# Patient Record
Sex: Male | Born: 1940
Health system: Southern US, Community
[De-identification: ages and names within clinical notes are randomized; demographics above are authoritative.]

## PROBLEM LIST (undated history)

## (undated) DIAGNOSIS — I4891 Unspecified atrial fibrillation: Secondary | ICD-10-CM

## (undated) DIAGNOSIS — N200 Calculus of kidney: Secondary | ICD-10-CM

## (undated) DIAGNOSIS — Z9189 Other specified personal risk factors, not elsewhere classified: Secondary | ICD-10-CM

## (undated) DIAGNOSIS — E669 Obesity, unspecified: Secondary | ICD-10-CM

## (undated) DIAGNOSIS — E782 Mixed hyperlipidemia: Secondary | ICD-10-CM

## (undated) DIAGNOSIS — I10 Essential (primary) hypertension: Secondary | ICD-10-CM

## (undated) DIAGNOSIS — N63 Unspecified lump in unspecified breast: Secondary | ICD-10-CM

## (undated) DIAGNOSIS — N400 Enlarged prostate without lower urinary tract symptoms: Secondary | ICD-10-CM

## (undated) DIAGNOSIS — J309 Allergic rhinitis, unspecified: Secondary | ICD-10-CM

## (undated) DIAGNOSIS — G4730 Sleep apnea, unspecified: Secondary | ICD-10-CM

## (undated) DIAGNOSIS — Z950 Presence of cardiac pacemaker: Secondary | ICD-10-CM

## (undated) DIAGNOSIS — K5790 Diverticulosis of intestine, part unspecified, without perforation or abscess without bleeding: Secondary | ICD-10-CM

## (undated) DIAGNOSIS — N529 Male erectile dysfunction, unspecified: Secondary | ICD-10-CM

## (undated) DIAGNOSIS — Z8619 Personal history of other infectious and parasitic diseases: Secondary | ICD-10-CM

## (undated) DIAGNOSIS — N201 Calculus of ureter: Secondary | ICD-10-CM

## (undated) DIAGNOSIS — K429 Umbilical hernia without obstruction or gangrene: Secondary | ICD-10-CM

## (undated) DIAGNOSIS — R319 Hematuria, unspecified: Secondary | ICD-10-CM

## (undated) DIAGNOSIS — E890 Postprocedural hypothyroidism: Secondary | ICD-10-CM

## (undated) HISTORY — DX: Unspecified atrial fibrillation: I48.91

## (undated) HISTORY — DX: Hematuria, unspecified: R31.9

## (undated) HISTORY — DX: Essential (primary) hypertension: I10

## (undated) HISTORY — PX: TONSILLECTOMY AND ADENOIDECTOMY: SUR1326

## (undated) HISTORY — DX: Umbilical hernia without obstruction or gangrene: K42.9

## (undated) HISTORY — DX: Calculus of ureter: N20.1

## (undated) HISTORY — DX: Personal history of other infectious and parasitic diseases: Z86.19

## (undated) HISTORY — DX: Unspecified lump in unspecified breast: N63.0

## (undated) HISTORY — DX: Calculus of kidney: N20.0

## (undated) HISTORY — DX: Mixed hyperlipidemia: E78.2

## (undated) HISTORY — DX: Benign prostatic hyperplasia without lower urinary tract symptoms: N40.0

## (undated) HISTORY — DX: Postprocedural hypothyroidism: E89.0

## (undated) HISTORY — DX: Obesity, unspecified: E66.9

## (undated) HISTORY — DX: Male erectile dysfunction, unspecified: N52.9

## (undated) HISTORY — PX: THYROGLOSSAL DUCT CYST: SHX297

## (undated) HISTORY — DX: Other specified personal risk factors, not elsewhere classified: Z91.89

## (undated) HISTORY — DX: Diverticulosis of intestine, part unspecified, without perforation or abscess without bleeding: K57.90

## (undated) HISTORY — PX: COLONOSCOPY: SHX174

## (undated) HISTORY — PX: VASECTOMY: SHX75

## (undated) HISTORY — DX: Allergic rhinitis, unspecified: J30.9

---

## 1992-01-17 HISTORY — PX: ANTERIOR CRUCIATE LIGAMENT REPAIR: SHX115

## 1993-01-16 HISTORY — PX: OTHER SURGICAL HISTORY: SHX169

## 1997-07-30 ENCOUNTER — Emergency Department (HOSPITAL_COMMUNITY): Admission: EM | Admit: 1997-07-30 | Discharge: 1997-07-30 | Payer: Self-pay | Admitting: Emergency Medicine

## 1997-08-03 ENCOUNTER — Ambulatory Visit: Admission: RE | Admit: 1997-08-03 | Discharge: 1997-08-03 | Payer: Self-pay | Admitting: Family Medicine

## 1997-08-06 ENCOUNTER — Ambulatory Visit (HOSPITAL_COMMUNITY): Admission: RE | Admit: 1997-08-06 | Discharge: 1997-08-06 | Payer: Self-pay | Admitting: Family Medicine

## 1999-01-17 HISTORY — PX: FLEXIBLE SIGMOIDOSCOPY: SHX1649

## 1999-02-21 ENCOUNTER — Other Ambulatory Visit: Admission: RE | Admit: 1999-02-21 | Discharge: 1999-02-21 | Payer: Self-pay | Admitting: *Deleted

## 2002-01-16 HISTORY — PX: MENISCUS REPAIR: SHX5179

## 2002-07-07 ENCOUNTER — Encounter: Admission: RE | Admit: 2002-07-07 | Discharge: 2002-07-07 | Payer: Self-pay | Admitting: Family Medicine

## 2002-07-07 ENCOUNTER — Encounter: Payer: Self-pay | Admitting: Family Medicine

## 2002-07-15 ENCOUNTER — Encounter: Payer: Self-pay | Admitting: Family Medicine

## 2002-07-15 ENCOUNTER — Encounter: Admission: RE | Admit: 2002-07-15 | Discharge: 2002-07-15 | Payer: Self-pay | Admitting: Family Medicine

## 2002-07-23 ENCOUNTER — Ambulatory Visit (HOSPITAL_BASED_OUTPATIENT_CLINIC_OR_DEPARTMENT_OTHER): Admission: RE | Admit: 2002-07-23 | Discharge: 2002-07-23 | Payer: Self-pay | Admitting: Orthopedic Surgery

## 2002-11-17 HISTORY — PX: SHOULDER SURGERY: SHX246

## 2002-11-19 ENCOUNTER — Ambulatory Visit (HOSPITAL_BASED_OUTPATIENT_CLINIC_OR_DEPARTMENT_OTHER): Admission: RE | Admit: 2002-11-19 | Discharge: 2002-11-19 | Payer: Self-pay | Admitting: Orthopedic Surgery

## 2003-01-17 HISTORY — PX: FOOT SURGERY: SHX648

## 2003-11-14 ENCOUNTER — Emergency Department (HOSPITAL_COMMUNITY): Admission: EM | Admit: 2003-11-14 | Discharge: 2003-11-14 | Payer: Self-pay | Admitting: Emergency Medicine

## 2006-10-03 ENCOUNTER — Emergency Department (HOSPITAL_COMMUNITY): Admission: EM | Admit: 2006-10-03 | Discharge: 2006-10-03 | Payer: Self-pay | Admitting: Emergency Medicine

## 2008-10-22 ENCOUNTER — Encounter (HOSPITAL_COMMUNITY): Admission: RE | Admit: 2008-10-22 | Discharge: 2009-01-15 | Payer: Self-pay | Admitting: Internal Medicine

## 2008-11-24 ENCOUNTER — Ambulatory Visit: Payer: Self-pay | Admitting: Internal Medicine

## 2008-11-24 ENCOUNTER — Observation Stay (HOSPITAL_COMMUNITY): Admission: EM | Admit: 2008-11-24 | Discharge: 2008-11-27 | Payer: Self-pay | Admitting: Emergency Medicine

## 2008-11-26 ENCOUNTER — Encounter: Payer: Self-pay | Admitting: Internal Medicine

## 2008-11-26 HISTORY — PX: PACEMAKER INSERTION: SHX728

## 2008-11-30 ENCOUNTER — Ambulatory Visit: Payer: Self-pay

## 2008-11-30 ENCOUNTER — Ambulatory Visit: Payer: Self-pay | Admitting: Internal Medicine

## 2008-11-30 DIAGNOSIS — I4891 Unspecified atrial fibrillation: Secondary | ICD-10-CM | POA: Insufficient documentation

## 2008-11-30 DIAGNOSIS — Z950 Presence of cardiac pacemaker: Secondary | ICD-10-CM

## 2008-12-16 ENCOUNTER — Encounter: Payer: Self-pay | Admitting: Internal Medicine

## 2008-12-16 ENCOUNTER — Ambulatory Visit: Payer: Self-pay

## 2009-01-16 HISTORY — PX: SHOULDER SURGERY: SHX246

## 2009-02-19 DIAGNOSIS — I10 Essential (primary) hypertension: Secondary | ICD-10-CM | POA: Insufficient documentation

## 2009-02-19 DIAGNOSIS — E039 Hypothyroidism, unspecified: Secondary | ICD-10-CM

## 2009-02-23 ENCOUNTER — Ambulatory Visit: Payer: Self-pay | Admitting: Internal Medicine

## 2009-06-22 ENCOUNTER — Encounter: Admission: RE | Admit: 2009-06-22 | Discharge: 2009-06-22 | Payer: Self-pay | Admitting: Orthopedic Surgery

## 2009-07-09 ENCOUNTER — Ambulatory Visit (HOSPITAL_BASED_OUTPATIENT_CLINIC_OR_DEPARTMENT_OTHER): Admission: RE | Admit: 2009-07-09 | Discharge: 2009-07-09 | Payer: Self-pay | Admitting: Orthopedic Surgery

## 2009-11-16 ENCOUNTER — Ambulatory Visit: Payer: Self-pay | Admitting: Internal Medicine

## 2010-02-15 NOTE — Cardiovascular Report (Signed)
Summary: Office Visit   Office Visit   Imported By: Roderic Ovens 01/28/2009 16:14:17  _____________________________________________________________________  External Attachment:    Type:   Image     Comment:   External Document

## 2010-02-15 NOTE — Cardiovascular Report (Signed)
Summary: Office Visit   Office Visit   Imported By: Roderic Ovens 11/18/2009 16:09:41  _____________________________________________________________________  External Attachment:    Type:   Image     Comment:   External Document

## 2010-02-15 NOTE — Assessment & Plan Note (Signed)
Summary: device/saf   Visit Type:  PPM St Jude  CC:  no complaints.  History of Present Illness: Arthur Daniel is seen in followup for pacemaker implanted for tachybradycardia syndrome with paroxysmal atrial fibrillation.  thromboembolic risk factors are notable for hypertension and age for CHADS VASC score of 2 and a CAD score of one  He is followed by Dr. Raquel James  Problems Prior to Update: 1)  Sinus Bradycardia  (ICD-427.81) 2)  Hypothyroidism  (ICD-244.9) 3)  Hypertension  (ICD-401.9) 4)  Cardiac Pacemaker in Situ  (ICD-V45.01) 5)  Atrial Fibrillation  (ICD-427.31)  Current Medications (verified): 1)  Lisinopril 5 Mg Tabs (Lisinopril) .... Take One Tablet By Mouth Daily 2)  Levothroid 150 Mcg Tabs (Levothyroxine Sodium) .... Once Daily 3)  Alprazolam 0.5 Mg Tabs (Alprazolam) .... As Needed  Allergies (verified): No Known Drug Allergies  Past History:  Past Medical History: Last updated: 02/19/2009 1. Hyperlipidemia.   2. Atrial fibrillation.   3. Hypertension.   4. Anxiety.   5. Hyperthyroidism.      Past Surgical History: Last updated: 02/19/2009  Tonsillectomy, ACL repair, ear surgery, left   knee surgery, right shoulder surgery, left foot surgery.      Family History: Last updated: 02/19/2009  Significant for his father dying from alcoholic cirrhosis.   Brother died with coronary artery disease.   Mother also had coronary artery disease.   Social History: Last updated: 02/19/2009  He is a previous smoker, he quit.   He has given up drinking.   He does use chewing tobacco from time to time.   He has cut back on his caffeine also.      Vital Signs:  Patient profile:   70 year old male Height:      72 inches Weight:      261.75 pounds BMI:     35.63 Pulse rate:   65 / minute BP sitting:   122 / 78  (left arm) Cuff size:   regular  Vitals Entered By: Arthur Daniel CMA (November 16, 2009 11:53 AM)   PPM Specifications Following MD:  Sherryl Manges, MD      Referring MD:  [facility] PPM Vendor:  St Jude     PPM Model Number:  [id number]     PPM Serial Number:  [id number] PPM DOI:  11/26/2008     PPM Implanting MD:  Sherryl Manges, MD  Lead 1    Location: RA     DOI: 11/26/2008     Model #: [id number]     Serial #: [id number]     Status: active Lead 2    Location: RV     DOI: 11/26/2008     Model #: [id number]     Serial #: [id number]     Status: active  Magnet Response Rate:  BOL 100 ERI  85  Indications:  AFIB/BRADYCARDIA   PPM Follow Up Battery Voltage:  2.99 V     Battery Est. Longevity:  9.1-10.6 yrs     Pacer Dependent:  No       PPM Device Measurements Atrium  Amplitude: 5.0 mV, Impedance: 380 ohms, Threshold: 0.75 V at 0.4 msec Right Ventricle  Amplitude: 12.0 mV, Impedance: 460 ohms, Threshold: 0.75 V at 0.4 msec  Episodes MS Episodes:  0     Coumadin:  No Ventricular High Rate:  0     Atrial Pacing:  47%     Ventricular Pacing:  <1%  Parameters Mode:  DDDR  Lower Rate Limit:  60     Upper Rate Limit:  125 Paced AV Delay:  200     Sensed AV Delay:  200 Tech Comments:  NORMAL DEVICE FUNCTION.  NO EPISODES SINCE LAST CHECK.  CHANGED RV OUTPUT FROM 2.0 TO 2.5 V. FOLLOWED BY [facility]. Vella Kohler  November 16, 2009 11:52 AM  Impression & Recommendations:  Problem # 1:  CARDIAC PACEMAKER IN SITU (ICD-V45.01) devices be followed by Dr. Hedy Jacob we will see as needed

## 2010-02-15 NOTE — Cardiovascular Report (Signed)
Summary: Office Visit   Office Visit   Imported By: Roderic Ovens 03/05/2009 13:03:04  _____________________________________________________________________  External Attachment:    Type:   Image     Comment:   External Document

## 2010-02-15 NOTE — Cardiovascular Report (Signed)
Summary: Office Visit   Office Visit   Imported By: Roderic Ovens 01/28/2009 16:13:52  _____________________________________________________________________  External Attachment:    Type:   Image     Comment:   External Document

## 2010-02-15 NOTE — Assessment & Plan Note (Signed)
Summary: pc2/sl   History of Present Illness: Mr. Cacciola is seen in followup for pacemaker implanted for tachybradycardia syndrome with paroxysmal atrial fibrillation.  thromboembolic risk factors are notable for hypertension and age for CHADS VASC score of 2 and a CAD score of one  Current Medications (verified): 1)  Aspirin 81 Mg Tbec (Aspirin) .... Take One Tablet By Mouth Daily 2)  Lisinopril 5 Mg Tabs (Lisinopril) .... Take One Tablet By Mouth Daily 3)  Levothroid 112 Mcg Tabs (Levothyroxine Sodium) .... 1/2 Tablet Daily 4)  Alprazolam 0.5 Mg Tabs (Alprazolam) .... As Needed 5)  Fish Oil 1000 Mg Caps (Omega-3 Fatty Acids) .... Take One Capsule Daily  Allergies (verified): No Known Drug Allergies  Past History:  Past Medical History: Last updated: 02/19/2009 1. Hyperlipidemia.   2. Atrial fibrillation.   3. Hypertension.   4. Anxiety.   5. Hyperthyroidism.      Past Surgical History: Last updated: 02/19/2009  Tonsillectomy, ACL repair, ear surgery, left   knee surgery, right shoulder surgery, left foot surgery.      Family History: Last updated: 02/19/2009  Significant for his father dying from alcoholic cirrhosis.   Brother died with coronary artery disease.   Mother also had coronary artery disease.   Social History: Last updated: 02/19/2009  He is a previous smoker, he quit.   He has given up drinking.   He does use chewing tobacco from time to time.   He has cut back on his caffeine also.      Vital Signs:  Patient profile:   70 year old male Height:      72 inches Weight:      249 pounds BMI:     33.89 Pulse rate:   93 / minute Pulse rhythm:   regular BP sitting:   146 / 97  (left arm) Cuff size:   large  Vitals Entered By: Judithe Modest CMA (February 23, 2009 9:44 AM)   PPM Specifications Following MD:  Sherryl Manges, MD     Referring MD:  [facility] Vendor:  St Jude     PPM Model Number:  [id number]     PPM Serial Number:  [id number] PPM DOI:   11/26/2008     PPM Implanting MD:  Sherryl Manges, MD  Lead 1    Location: RA     DOI: 11/26/2008     Model #: [id number]     Serial #: [id number]     Status: active Lead 2    Location: RV     DOI: 11/26/2008     Model #: [id number]     Serial #: [id number]     Status: active  Magnet Response Rate:  BOL 100 ERI  85  Indications:  AFIB/BRADYCARDIA   PPM Follow Up Remote Check?  No Battery Voltage:  3.02 V     Battery Est. Longevity:  9.8 years     Pacer Dependent:  No       PPM Device Measurements Atrium  Amplitude: 5.0 mV, Impedance: 380 ohms, Threshold: 0.75 V at 0.4 msec Right Ventricle  Amplitude: 12 mV, Impedance: 460 ohms, Threshold: 0.75 V at 0.4 msec  Episodes MS Episodes:  3     Percent Mode Switch:  <1%     Coumadin:  No Atrial Pacing:  49%     Ventricular Pacing:  <1%  Parameters Mode:  DDDR     Lower Rate Limit:  60     Upper Rate Limit:  120  Paced AV Delay:  200     Sensed AV Delay:  200 Tech Comments:  Outputs lowered to 2.0 in RA and RV.  PVC electrogram trigger off.  Longest mode switch 5 minute duration.  ROV 11/11 Dr. Graciela Husbands.  Checked by [employer]. Altha Harm, LPN  February 23, 2009 10:04 AM   Appended Document: [facility]     History of Present Illness:  hyperthyroidism and is recently he was started on thyroid replacement.  He has had problems with relatively low blood pressure and as such he discontinued his atenolol on his own yesterday. It also made him feel quite lethargic.  Allergies: No Known Drug Allergies  Physical Exam  General:  The patient was alert and oriented in no acute distress. HEENT Normal.  Neck veins were flat, carotids were brisk.  Lungs were clear.  Heart sounds were regular without murmurs or gallops.  Abdomen was soft with active bowel sounds. There is no clubbing cyanosis or edema. Skin Warm and dry pacemaker pocket was well-healed   PPM Specifications Following MD:  Sherryl Manges, MD     Referring MD:  [facility]  Vendor:  St Jude     PPM Model Number:  [id number]     PPM Serial Number:  [id number] PPM DOI:  11/26/2008     PPM Implanting MD:  Sherryl Manges, MD  Lead 1    Location: RA     DOI: 11/26/2008     Model #: [id number]     Serial #: [id number]     Status: active Lead 2    Location: RV     DOI: 11/26/2008     Model #: [id number]     Serial #: [id number]     Status: active  Magnet Response Rate:  BOL 100 ERI  85  Indications:  AFIB/BRADYCARDIA   PPM Follow Up Pacer Dependent:  No      Episodes Coumadin:  No  Parameters Mode:  DDDR     Lower Rate Limit:  60     Upper Rate Limit:  120 Paced AV Delay:  200     Sensed AV Delay:  200  Impression & Recommendations:  Problem # 1:  CARDIAC PACEMAKER IN SITU (ICD-V45.01) Device parameters and data were reviewed and no changes were made apart from reprogramming the device to maximize longevity    Problem # 2:  ATRIAL FIBRILLATION (ICD-427.31) There have been no intercurrent episodes of atrial fibrillation. It may well be that his atrial fibrillation was related to his hyperthyroid condition and with its normalization it is not an ongoing problem. I would withhold Coumadin at the present time until he declares with more atrial fibrillation. His updated medication list for this problem includes:    Aspirin 81 Mg Tbec (Aspirin) .Marland Kitchen... Take one tablet by mouth daily  Problem # 3:  HYPERTENSION (ICD-401.9) His blood pressure is elevated today. He stopped his atenolol yesterday. I recommended that he follow up with his primary care physician to make a decision as to how best to go forward. I would also suggest that we wait until his beta blocker withdrawal and his thyroid condition has stabilized prior to up titration of his antihypertensives His updated medication list for this problem includes:    Aspirin 81 Mg Tbec (Aspirin) .Marland Kitchen... Take one tablet by mouth daily    Lisinopril 5 Mg Tabs (Lisinopril) .Marland Kitchen... Take one tablet by mouth daily

## 2010-04-03 LAB — BASIC METABOLIC PANEL
BUN: 8 mg/dL (ref 6–23)
CO2: 28 mEq/L (ref 19–32)
Calcium: 8.9 mg/dL (ref 8.4–10.5)
Chloride: 105 mEq/L (ref 96–112)
Creatinine, Ser: 0.96 mg/dL (ref 0.4–1.5)
GFR calc Af Amer: >60 mL/min (ref >60)
GFR calc non Af Amer: >60 mL/min (ref >60)
Glucose, Bld: 88 mg/dL (ref 70–99)
Potassium: 4.8 mEq/L (ref 3.5–5.1)
Sodium: 139 mEq/L (ref 135–145)

## 2010-04-03 LAB — POCT HEMOGLOBIN-HEMACUE: Hemoglobin: 16.6 g/dL (ref 13.0–17.0)

## 2010-04-20 LAB — COMPREHENSIVE METABOLIC PANEL
ALT: 31 U/L (ref 0–53)
AST: 25 U/L (ref 0–37)
Albumin: 3.9 g/dL (ref 3.5–5.2)
Alkaline Phosphatase: 51 U/L (ref 39–117)
BUN: 13 mg/dL (ref 6–23)
CO2: 27 mEq/L (ref 19–32)
Calcium: 9 mg/dL (ref 8.4–10.5)
Chloride: 103 mEq/L (ref 96–112)
Creatinine, Ser: 0.87 mg/dL (ref 0.4–1.5)
GFR calc Af Amer: >60 mL/min (ref >60)
GFR calc non Af Amer: >60 mL/min (ref >60)
Glucose, Bld: 107 mg/dL — ABNORMAL HIGH (ref 70–99)
Potassium: 3.6 mEq/L (ref 3.5–5.1)
Sodium: 136 mEq/L (ref 135–145)
Total Bilirubin: 1.1 mg/dL (ref 0.3–1.2)
Total Protein: 6.6 g/dL (ref 6.0–8.3)

## 2010-04-20 LAB — BASIC METABOLIC PANEL
BUN: 12 mg/dL (ref 6–23)
Calcium: 8.5 mg/dL (ref 8.4–10.5)
GFR calc non Af Amer: >60 mL/min (ref >60)
Glucose, Bld: 106 mg/dL — ABNORMAL HIGH (ref 70–99)
Potassium: 3.1 mEq/L — ABNORMAL LOW (ref 3.5–5.1)

## 2010-04-20 LAB — CBC
HCT: 44.3 % (ref 39.0–52.0)
HCT: 47.4 % (ref 39.0–52.0)
Hemoglobin: 16.6 g/dL (ref 13.0–17.0)
MCHC: 35.1 g/dL (ref 30.0–36.0)
MCV: 90.1 fL (ref 78.0–100.0)
Platelets: 104 10*3/uL — ABNORMAL LOW (ref 150–400)
Platelets: 127 10*3/uL — ABNORMAL LOW (ref 150–400)
RBC: 5.26 MIL/uL (ref 4.22–5.81)
RDW: 12.3 % (ref 11.5–15.5)
RDW: 12.7 % (ref 11.5–15.5)
WBC: 7.1 10*3/uL (ref 4.0–10.5)

## 2010-04-20 LAB — CK TOTAL AND CKMB (NOT AT ARMC)
CK, MB: 1.5 ng/mL (ref 0.3–4.0)
Relative Index: INVALID (ref 0.0–2.5)
Total CK: 77 U/L (ref 7–232)

## 2010-04-20 LAB — CARDIAC PANEL(CRET KIN+CKTOT+MB+TROPI)
CK, MB: 0.8 ng/mL (ref 0.3–4.0)
Relative Index: INVALID (ref 0.0–2.5)
Total CK: 46 U/L (ref 7–232)
Troponin I: 0.01 ng/mL (ref 0.00–0.06)

## 2010-04-20 LAB — T4: T4, Total: 15.6 ug/dL — ABNORMAL HIGH (ref 5.0–12.5)

## 2010-04-20 LAB — POCT I-STAT, CHEM 8
BUN: 18 mg/dL (ref 6–23)
Calcium, Ion: 1.12 mmol/L (ref 1.12–1.32)
Chloride: 103 mEq/L (ref 96–112)
Creatinine, Ser: 1 mg/dL (ref 0.4–1.5)
Glucose, Bld: 103 mg/dL — ABNORMAL HIGH (ref 70–99)
HCT: 49 % (ref 39.0–52.0)
Hemoglobin: 16.7 g/dL (ref 13.0–17.0)
Potassium: 4.7 mEq/L (ref 3.5–5.1)
Sodium: 138 mEq/L (ref 135–145)
TCO2: 29 mmol/L (ref 0–100)

## 2010-04-20 LAB — PROTIME-INR: Prothrombin Time: 13.3 seconds (ref 11.6–15.2)

## 2010-04-20 LAB — TSH: TSH: 0.008 u[IU]/mL — ABNORMAL LOW (ref 0.350–4.500)

## 2010-04-20 LAB — T4, FREE: Free T4: 2.91 ng/dL — ABNORMAL HIGH (ref 0.80–1.80)

## 2010-04-20 LAB — TROPONIN I: Troponin I: 0.02 ng/mL (ref 0.00–0.06)

## 2010-04-20 LAB — T3: T3, Total: 210.9 ng/dl — ABNORMAL HIGH (ref 80.0–204.0)

## 2010-06-03 NOTE — Op Note (Signed)
NAME:  Arthur Daniel, Arthur Daniel                         ACCOUNT NO.:  [id number]   MEDICAL RECORD NO.:  [id number]                   PATIENT TYPE:  AMB   LOCATION:  DSC                                  FACILITY:  [facility]   PHYSICIAN:  Harvie Junior, M.D.                DATE OF BIRTH:  1940-10-14   DATE OF PROCEDURE:  11/19/2002  DATE OF DISCHARGE:  11/19/2002                                 OPERATIVE REPORT   DATE OF REPEAT LOST DICTATION:  January 07, 2003   PREOPERATIVE DIAGNOSES:  1. Impingement of right shoulder with acromioclavicular joint arthritis.  2. Partial thickness rotator cuff tear on the undersurface.   POSTOPERATIVE DIAGNOSES:  1. Impingement of right shoulder with acromioclavicular joint arthritis.  2. Partial thickness rotator cuff tear on the undersurface.   PROCEDURE:  1. Right shoulder arthroscopy with subacromial decompression.  2. Partial distal clavicle resection.  3. Debridement of undersurface partial thickness, rotator cuff tear.   SURGEON:  Harvie Junior, M.D.   ASSISTANT:  Marshia Ly, P.A.   ANESTHESIA:  General.   BRIEF HISTORY:  He is a 70 year old male with a long history of having  significant right shoulder pain.  He was treated conservative with anti-  inflammatory medication, activity modification, physical therapy,  stretching, strengthening program; all of this having failed.  The patient  was ultimately taken to the operating room for examination under anesthesia  and arthroscopy.   DESCRIPTION OF PROCEDURE:  The patient was taken to the operating room and  when after adequate anesthesia was obtained with a general anesthetic the  patient was placed supine on the operating table.  The right shoulder was  prepped and draped in the usual sterile fashion.  Following this routine  arthroscopic examination of the shoulder revealed there was an obvious  undersurface tear of the rotator cuff. This was debrided with a suction  shaver back to a  smooth and stable rim. A probe was used to make sure that  this was stable.   Attention was then turned to the biceps tendon which was within normal  limits with no significant or degenerative changes of the glenohumeral  joint.  Attention was turned out of the glenohumeral joint into the  subacromial space where an anterolateral acromioplasty was perform through  the posterior and lateral portal. Finishing the acromioplasty attention was  turned to the distal clavicle where the clavicle was obviously impinging the  superior surface of the rotator cuff.  A 2 cm distal clavicle was excised  through the anterior portal.  At this point a probe was used to make sure  that the 15 mm of the distal clavicle was resected. At this point, the  shoulder was copiously  irrigated and suctioned dry.  The outside portals were closed with a  bandage.  A sterile compression dressing was applied.  The patient was taken  to the recovery room  where he was noted to be in satisfactory condition.   ESTIMATED BLOOD LOSS:  None.                                               Harvie Junior, M.D.    Ranae Plumber  D:  01/09/2003  T:  01/11/2003  Job:  [id number]

## 2010-06-03 NOTE — Op Note (Signed)
Arthur Daniel, Arthur Daniel               ACCOUNT NO.:  [id number]   MEDICAL RECORD NO.:  [id number]          PATIENT TYPE:  EMS   LOCATION:  MINO                         FACILITY:  [facility]   PHYSICIAN:  Feliberto Gottron. Turner Daniels, M.D.   DATE OF BIRTH:  02/23/1940   DATE OF PROCEDURE:  11/14/2003  DATE OF DISCHARGE:  11/14/2003                                 OPERATIVE REPORT   PREOPERATIVE DIAGNOSIS:  Left foot chain saw laceration with cuneiform  fracture.   POSTOPERATIVE DIAGNOSIS:  Left foot chain saw laceration with cuneiform  fracture.   OPERATION PERFORMED:  I & D of left foot.   SURGEON:  Feliberto Gottron. Turner Daniels, M.D.   ASSISTANTLaural Benes. Jannet Mantis.   ANESTHESIA:  General endotracheal.   ESTIMATED BLOOD LOSS:  50 mL.   FLUIDS REPLACED:  700 mL.   TOURNIQUET TIME:  Seven minutes.   INDICATIONS FOR PROCEDURE:  The patient is a 70 year old man who was  operating a chain saw when it kicked back, cut through his leather boot and  into the medial  aspect of his left foot.  Fairly impressive laceration and  he cut a trough in his cuneiform based on x-ray.  It does not look it is a  through-and-through fracture but he cut a trough that was about a half inch  deep over the width of the cuneiform medially in his food and he is taken  for irrigation and debridement a few hours after the injury after going  through the emergency room.   DESCRIPTION OF PROCEDURE:  The patient was identified by arm band, taken to  the operating room at [facility] main hospital where appropriate  anesthetic monitors were attached.  General endotracheal anesthesia was  induced with the patient in supine position tourniquet applied to the left  ankle and the foot was elevated.  He was then prepped and draped in the  usual sterile fashion.  Tourniquet inflated to 300 mmHg and we began the  procedure by removing bits of necrotic skin from the edge of the wound which  actually came together quite nicely and  remarkably there was little if any  dirt noted in the skin, the subcutaneous tissue or down in the cuneiform  which was very easy to visualize through the wound and in fact it  had not  fractured through-and-through.  This was simply a trough cut into the  cuneiform that did not go through to the lateral side.  In any event, using  sharp dissection, any bits of necrotic tissue were removed.  Again, there  was very little if any dirt.  He was then thoroughly pulse lavaged out with  3 liters of normal saline solution and the wound was very loosely closed  with the skin only using 2-0 nylon suture.  The tourniquet was let down  prior to the closure.  There was little if any bleeding noted and  miraculously he had not cut any significant tendons, nerves or vessels when  he made his medial approach to the cuneiform.  Obviously the short flexor  muscle bellies  were at least partially torn but remarkably he had not done  too much structural damage to the foot except for the cutting through about  50% of the cuneiform bone.  He was given  perioperative antibiotics.  He got some antibiotics in the emergency room  prior to coming up as well.  In any event, a dressing of  4 x 4s, Webril and  an Ace wrap was applied.  The patient was awakened and taken to the recovery  room without difficulty.      Emilio Aspen  D:  11/30/2003  T:  11/30/2003  Job:  [id number]

## 2010-06-03 NOTE — Op Note (Signed)
NAME:  Arthur Daniel, Arthur Daniel                         ACCOUNT NO.:  [id number]   MEDICAL RECORD NO.:  [id number]                   PATIENT TYPE:  AMB   LOCATION:  DSC                                  FACILITY:  [facility]   PHYSICIAN:  Harvie Junior, M.D.                DATE OF BIRTH:  [birth year]   DATE OF PROCEDURE:  07/23/2002  DATE OF DISCHARGE:                                 OPERATIVE REPORT   PREOPERATIVE DIAGNOSIS:  Medial meniscal tear.   POSTOPERATIVE DIAGNOSES:  1. Medial meniscal tear.  2. Medial shelf plica with chondral injury medial femoral condyle.   OPERATION:  1. Partial posterior medial meniscectomy.  2. medial plica resection with debridement of femoral condylar defect.   SURGEON:  Harvie Junior, M.D.   ASSISTANT:  Marshia Ly, P.A.   ANESTHESIA:  General.   BRIEF HISTORY:  He is a 70 year old male with a long history of having right  knee pain after a twisting type injury, stepping down off of a trailer.  He  ultimately was seen by Dr. Foy Guadalajara.  An MRI was obtained which showed that he  had a posterior horn medial meniscal tear.  The patient presented to our  office for evaluation.  At that time, we talked about treatment options.  We  ultimately felt that operative intervention would be the most appropriate  course of action.  He was brought to the operating room for this procedure.   DESCRIPTION OF PROCEDURE:  The patient was brought to the operating room and  after adequate anesthesia was obtained with general anesthetic, the patient  was placed supine on the operating table.  The left leg was prepped and  draped in the usual sterile fashion.  Following this, routine arthroscopic  examination of the knee revealed there was an obvious medial shelf plica.  It was large and abrading the medial femoral condyle.  The plica was  resected back to the rim and the area of abrasion of the condyle was  smoothed down.  Attention was then turned to the medial compartment,  where  there was noted to be a large posterior horn medial meniscal tear.  This was  debrided with a combination of straight biting forceps, upbiting forceps and  right hand side biting forceps back to a smooth and stable rim.  The cannula  was removed from the medial to lateral compartment and the leading edge was  taken down to avoid having any tendency towards catching.  At this point,  the knee was copiously irrigated and suctioned dry after a check was made of  the anterior cruciate ligament which was normal, lateral side normal,  patella femoral  midline tracking and no abnormality.  At this point, a sterile compressive  dressing was applied.  The patient was taken to the recovery room and was  noted to be in satisfactory condition.  The estimated blood loss for  the  procedure was none.                                               Harvie Junior, M.D.    Ranae Plumber  D:  07/23/2002  T:  07/24/2002  Job:  [id number]   cc:   Molly Maduro L. Foy Guadalajara, M.D.  [address] [place]  [place] [address]  Fax: [phone]

## 2010-08-17 HISTORY — PX: CATARACT EXTRACTION, BILATERAL: SHX1313

## 2010-10-11 ENCOUNTER — Other Ambulatory Visit: Payer: Self-pay | Admitting: Family Medicine

## 2010-10-27 LAB — URINE MICROSCOPIC-ADD ON

## 2010-10-27 LAB — URINALYSIS, ROUTINE W REFLEX MICROSCOPIC
Glucose, UA: NEGATIVE
Ketones, ur: 15 — AB
Leukocytes, UA: NEGATIVE
Nitrite: NEGATIVE
Protein, ur: NEGATIVE
Specific Gravity, Urine: 1.026
Urobilinogen, UA: 1
pH: 6

## 2011-01-20 DIAGNOSIS — N281 Cyst of kidney, acquired: Secondary | ICD-10-CM | POA: Diagnosis not present

## 2011-01-20 DIAGNOSIS — N529 Male erectile dysfunction, unspecified: Secondary | ICD-10-CM | POA: Diagnosis not present

## 2011-01-20 DIAGNOSIS — N200 Calculus of kidney: Secondary | ICD-10-CM | POA: Diagnosis not present

## 2011-02-01 DIAGNOSIS — D126 Benign neoplasm of colon, unspecified: Secondary | ICD-10-CM | POA: Diagnosis not present

## 2011-03-20 DIAGNOSIS — J069 Acute upper respiratory infection, unspecified: Secondary | ICD-10-CM | POA: Diagnosis not present

## 2011-03-20 DIAGNOSIS — Z1331 Encounter for screening for depression: Secondary | ICD-10-CM | POA: Diagnosis not present

## 2011-05-01 DIAGNOSIS — H7010 Chronic mastoiditis, unspecified ear: Secondary | ICD-10-CM | POA: Diagnosis not present

## 2011-05-01 DIAGNOSIS — H6120 Impacted cerumen, unspecified ear: Secondary | ICD-10-CM | POA: Diagnosis not present

## 2011-05-03 DIAGNOSIS — D485 Neoplasm of uncertain behavior of skin: Secondary | ICD-10-CM | POA: Diagnosis not present

## 2011-05-03 DIAGNOSIS — L821 Other seborrheic keratosis: Secondary | ICD-10-CM | POA: Diagnosis not present

## 2011-05-03 DIAGNOSIS — L820 Inflamed seborrheic keratosis: Secondary | ICD-10-CM | POA: Diagnosis not present

## 2011-05-23 DIAGNOSIS — E669 Obesity, unspecified: Secondary | ICD-10-CM | POA: Diagnosis not present

## 2011-05-23 DIAGNOSIS — E782 Mixed hyperlipidemia: Secondary | ICD-10-CM | POA: Diagnosis not present

## 2011-05-23 DIAGNOSIS — I10 Essential (primary) hypertension: Secondary | ICD-10-CM | POA: Diagnosis not present

## 2011-05-23 DIAGNOSIS — I4891 Unspecified atrial fibrillation: Secondary | ICD-10-CM | POA: Diagnosis not present

## 2011-08-16 DIAGNOSIS — K648 Other hemorrhoids: Secondary | ICD-10-CM | POA: Diagnosis not present

## 2011-08-30 DIAGNOSIS — K648 Other hemorrhoids: Secondary | ICD-10-CM | POA: Diagnosis not present

## 2011-09-13 DIAGNOSIS — K648 Other hemorrhoids: Secondary | ICD-10-CM | POA: Diagnosis not present

## 2011-10-30 DIAGNOSIS — H7010 Chronic mastoiditis, unspecified ear: Secondary | ICD-10-CM | POA: Diagnosis not present

## 2011-10-30 DIAGNOSIS — H902 Conductive hearing loss, unspecified: Secondary | ICD-10-CM | POA: Diagnosis not present

## 2011-11-16 DIAGNOSIS — M25569 Pain in unspecified knee: Secondary | ICD-10-CM | POA: Diagnosis not present

## 2011-11-28 DIAGNOSIS — L820 Inflamed seborrheic keratosis: Secondary | ICD-10-CM | POA: Diagnosis not present

## 2012-01-06 DIAGNOSIS — M546 Pain in thoracic spine: Secondary | ICD-10-CM | POA: Diagnosis not present

## 2012-01-17 LAB — HM COLONOSCOPY

## 2012-04-29 DIAGNOSIS — H7010 Chronic mastoiditis, unspecified ear: Secondary | ICD-10-CM | POA: Diagnosis not present

## 2012-04-29 DIAGNOSIS — H903 Sensorineural hearing loss, bilateral: Secondary | ICD-10-CM | POA: Diagnosis not present

## 2012-05-21 DIAGNOSIS — I10 Essential (primary) hypertension: Secondary | ICD-10-CM | POA: Diagnosis not present

## 2012-05-21 DIAGNOSIS — E782 Mixed hyperlipidemia: Secondary | ICD-10-CM | POA: Diagnosis not present

## 2012-05-21 DIAGNOSIS — I4891 Unspecified atrial fibrillation: Secondary | ICD-10-CM | POA: Diagnosis not present

## 2012-05-21 DIAGNOSIS — E669 Obesity, unspecified: Secondary | ICD-10-CM | POA: Diagnosis not present

## 2012-06-06 DIAGNOSIS — E890 Postprocedural hypothyroidism: Secondary | ICD-10-CM | POA: Diagnosis not present

## 2012-06-06 DIAGNOSIS — E0500 Thyrotoxicosis with diffuse goiter without thyrotoxic crisis or storm: Secondary | ICD-10-CM | POA: Diagnosis not present

## 2012-09-19 DIAGNOSIS — S4350XA Sprain of unspecified acromioclavicular joint, initial encounter: Secondary | ICD-10-CM | POA: Diagnosis not present

## 2012-10-01 DIAGNOSIS — L820 Inflamed seborrheic keratosis: Secondary | ICD-10-CM | POA: Diagnosis not present

## 2012-10-01 DIAGNOSIS — L821 Other seborrheic keratosis: Secondary | ICD-10-CM | POA: Diagnosis not present

## 2012-10-16 DIAGNOSIS — H6120 Impacted cerumen, unspecified ear: Secondary | ICD-10-CM | POA: Diagnosis not present

## 2012-10-16 DIAGNOSIS — H7010 Chronic mastoiditis, unspecified ear: Secondary | ICD-10-CM | POA: Diagnosis not present

## 2012-10-16 DIAGNOSIS — H903 Sensorineural hearing loss, bilateral: Secondary | ICD-10-CM | POA: Diagnosis not present

## 2012-11-05 DIAGNOSIS — R071 Chest pain on breathing: Secondary | ICD-10-CM | POA: Diagnosis not present

## 2012-12-10 ENCOUNTER — Encounter: Payer: Self-pay | Admitting: Internal Medicine

## 2012-12-17 DIAGNOSIS — R05 Cough: Secondary | ICD-10-CM | POA: Diagnosis not present

## 2012-12-17 DIAGNOSIS — H6690 Otitis media, unspecified, unspecified ear: Secondary | ICD-10-CM | POA: Diagnosis not present

## 2012-12-17 DIAGNOSIS — R059 Cough, unspecified: Secondary | ICD-10-CM | POA: Diagnosis not present

## 2012-12-23 DIAGNOSIS — H6120 Impacted cerumen, unspecified ear: Secondary | ICD-10-CM | POA: Diagnosis not present

## 2012-12-23 DIAGNOSIS — H7010 Chronic mastoiditis, unspecified ear: Secondary | ICD-10-CM | POA: Diagnosis not present

## 2012-12-25 ENCOUNTER — Ambulatory Visit (INDEPENDENT_AMBULATORY_CARE_PROVIDER_SITE_OTHER): Payer: Medicare Other | Admitting: *Deleted

## 2012-12-25 DIAGNOSIS — I4891 Unspecified atrial fibrillation: Secondary | ICD-10-CM

## 2012-12-27 NOTE — Progress Notes (Signed)
Pacemaker check in clinic. Normal device function. Thresholds, sensing, impedances consistent with previous measurements. Device programmed to maximize longevity. No mode switch or high ventricular rates noted. Device programmed at appropriate safety margins. Histogram distribution appropriate for patient activity level. Device programmed to optimize intrinsic conduction. Estimated longevity 7-9 years. Patient will follow up with [facility] in 3 months.

## 2012-12-30 LAB — MDC_IDC_ENUM_SESS_TYPE_INCLINIC
Battery Voltage: 2.96 V
Brady Statistic RV Percent Paced: 1 % — CL
Implantable Pulse Generator Model: 2210
Implantable Pulse Generator Serial Number: 7079515
Lead Channel Impedance Value: 380 Ohm
Lead Channel Sensing Intrinsic Amplitude: 4.8 mV
Lead Channel Setting Pacing Amplitude: 2 V
Lead Channel Setting Sensing Sensitivity: 2 mV

## 2013-01-13 ENCOUNTER — Encounter: Payer: Self-pay | Admitting: Internal Medicine

## 2013-01-22 DIAGNOSIS — H7010 Chronic mastoiditis, unspecified ear: Secondary | ICD-10-CM | POA: Diagnosis not present

## 2013-01-22 DIAGNOSIS — H6120 Impacted cerumen, unspecified ear: Secondary | ICD-10-CM | POA: Diagnosis not present

## 2013-01-22 DIAGNOSIS — H60399 Other infective otitis externa, unspecified ear: Secondary | ICD-10-CM | POA: Diagnosis not present

## 2013-04-02 ENCOUNTER — Ambulatory Visit (INDEPENDENT_AMBULATORY_CARE_PROVIDER_SITE_OTHER): Payer: BC Managed Care – PPO | Admitting: Internal Medicine

## 2013-04-02 ENCOUNTER — Encounter: Payer: Self-pay | Admitting: Internal Medicine

## 2013-04-02 VITALS — BP 112/65 | HR 60 | Ht 72.0 in | Wt 273.8 lb

## 2013-04-02 DIAGNOSIS — I4891 Unspecified atrial fibrillation: Secondary | ICD-10-CM

## 2013-04-02 DIAGNOSIS — I10 Essential (primary) hypertension: Secondary | ICD-10-CM

## 2013-04-02 DIAGNOSIS — Z950 Presence of cardiac pacemaker: Secondary | ICD-10-CM

## 2013-04-02 LAB — MDC_IDC_ENUM_SESS_TYPE_INCLINIC
Battery Remaining Longevity: 100.8 mo
Brady Statistic RA Percent Paced: 45 %
Brady Statistic RV Percent Paced: 0.05 %
Lead Channel Impedance Value: 375 Ohm
Lead Channel Pacing Threshold Pulse Width: 0.4 ms
Lead Channel Sensing Intrinsic Amplitude: 4.4 mV
Lead Channel Sensing Intrinsic Amplitude: 9 mV
Lead Channel Setting Pacing Amplitude: 2 V
Lead Channel Setting Pacing Pulse Width: 0.4 ms
Lead Channel Setting Sensing Sensitivity: 2 mV
MDC IDC MSMT BATTERY VOLTAGE: 2.95 V
MDC IDC MSMT LEADCHNL RA PACING THRESHOLD AMPLITUDE: 1 V
MDC IDC MSMT LEADCHNL RA PACING THRESHOLD PULSEWIDTH: 0.4 ms
MDC IDC MSMT LEADCHNL RV IMPEDANCE VALUE: 412.5 Ohm
MDC IDC MSMT LEADCHNL RV PACING THRESHOLD AMPLITUDE: 1 V
MDC IDC PG SERIAL: 7079515
MDC IDC SESS DTM: 20150318105721
MDC IDC SET LEADCHNL RV PACING AMPLITUDE: 2.5 V

## 2013-04-02 MED ORDER — NEBIVOLOL HCL 10 MG PO TABS: 5.0000 mg | ORAL_TABLET | Freq: Every day | ORAL | Status: DC

## 2013-04-02 NOTE — Assessment & Plan Note (Signed)
His blood pressure is not elevated. He notes that he has fatigue and states that he feels better when his blood pressure is not so low. I have asked the patient to reduce his dose of bystolic by half.

## 2013-04-02 NOTE — Assessment & Plan Note (Signed)
He appears to be maintaining NSR on PPM interogation. He is in rhythm 100% of the time. His atrial fib was thought due to a consequence of hyperthyroidism from Graves disease. In addition he is not thought to be a good candidate for coumadin with a h/o diverticular disease.

## 2013-04-02 NOTE — Progress Notes (Signed)
HPI The patient is referred today by Dr. Irish Lack for ongoing evaluation and management of his PPM. He is a pleasant 73 yo man with symptomatic bradycardia, due to sinus node dysfunction, s/p PPM. He notes that he "raised hell all my life" and has a longstanding h/o tobacco and ETOH abuse. The patient stopped both but has gained nearly 75 lbs since stopping smoking. He denies syncope, cough or hemoptysis. He also gives a h/o Graves disease and is s/p XRT and now on synthroid. He c/o feeling tired. Very little energy. At times his blood pressure is on the low side. No chest pressure. Allergies  Allergen Reactions  . Lipitor [Atorvastatin] Other (See Comments)    Breast lumps     Current Outpatient Prescriptions  Medication Sig Dispense Refill  . ALPRAZolam (XANAX) 1 MG tablet Take 0.5 mg by mouth every 6 (six) hours as needed for anxiety.      Marland Kitchen aspirin 325 MG tablet Take 325 mg by mouth daily.      Marland Kitchen levothyroxine (SYNTHROID, LEVOTHROID) 175 MCG tablet Take 175 mcg by mouth daily at 8 pm.      . lisinopril (PRINIVIL,ZESTRIL) 5 MG tablet Take 5 mg by mouth daily.      . nebivolol (BYSTOLIC) 10 MG tablet Take 0.5 tablets (5 mg total) by mouth daily.      . pravastatin (PRAVACHOL) 40 MG tablet Take 40 mg by mouth daily.       No current facility-administered medications for this visit.     Past Medical History  Diagnosis Date  . Hypercholesterolemia   . Hyperthyroidism   . Mixed hyperlipidemia   . Lump or mass in breast   . Essential hypertension, benign   . Allergic rhinitis, cause unspecified   . Calculus of kidney   . Atrial fibrillation   . Anxiety state, unspecified   . Hematuria, unspecified   . ED (erectile dysfunction)   . Postablative hypothyroidism   . Graves disease   . Diverticulosis   . Ureteral stone     Dr. Risa Grill  . Obesity     ROS:   All systems reviewed and negative except as noted in the HPI.   Past Surgical History  Procedure Laterality Date   . Tonsillectomy and adenoidectomy    . Anterior cruciate ligament repair Right 1994  . Other surgical history  1995    Ear surgery due to mastoiditis  . Meniscus repair Left 2004  . Shoulder surgery Right 11/2002  . Foot surgery Left 2005    Fot repair after chainsaw accident  . Pacemaker insertion  11/26/08    [name]  . Shoulder surgery Right 2011  . Cataract extraction, bilateral  08/2010  . Flexible sigmoidoscopy  2001  . Colonoscopy  2001, 2008     Family History  Problem Relation Age of Onset  . CVA Mother   . Hypercholesterolemia Mother   . Goiter Mother   . Diabetes Mother     DM  . Hypertension Mother   . CAD Mother   . Cirrhosis Father     due to alcohol  . Cancer Sister     Breast  . Hypercholesterolemia Brother   . Hypertension Brother   . CAD Brother   . Heart attack Son 41     History   Social History  . Marital Status: Married    Spouse Name: N/A    Number of Children: N/A  . Years of Education: N/A  Occupational History  . Not on file.   Social History Main Topics  . Smoking status: Former Smoker    Types: Cigarettes    Quit date: 01/16/2006  . Smokeless tobacco: Not on file     Comment: 1/2-1 pack a day  . Alcohol Use: Yes     Comment: rare glass of wine  . Drug Use: No  . Sexual Activity: Not on file   Other Topics Concern  . Not on file   Social History Narrative  . No narrative on file     BP 112/65  Pulse 60  Ht 6' (1.829 m)  Wt 273 lb 12 oz (124.172 kg)  BMI 37.12 kg/m2  Physical Exam:  Well appearing but morbidly obese, 73 yo man, NAD HEENT: Unremarkable Neck:  7 cm JVD, no thyromegally Back:  No CVA tenderness Lungs:  Clear with no wheezes. HEART:  Regular rate rhythm, no murmurs, no rubs, no clicks Abd:  soft, obese, positive bowel sounds, no organomegally, no rebound, no guarding Ext:  2 plus pulses, no edema, no cyanosis, no clubbing Skin:  No rashes no nodules Neuro:  CN II through XII intact, motor  grossly intact   DEVICE  Normal device function.  See PaceArt for details.   Assess/Plan:

## 2013-04-02 NOTE — Patient Instructions (Addendum)
Your physician wants you to follow-up in: 12 months with Dr Knox Saliva will receive a reminder letter in the mail two months in advance. If you don't receive a letter, please call our office to schedule the follow-up appointment.    Remote monitoring is used to monitor your Pacemaker or ICD from home. This monitoring reduces the number of office visits required to check your device to one time per year. It allows [place] to keep an eye on the functioning of your device to ensure it is working properly. You are scheduled for a device check from home on 07/07/13. You may send your transmission at any time that day. If you have a wireless device, the transmission will be sent automatically. After your physician reviews your transmission, you will receive a postcard with your next transmission date.  Your physician has recommended you make the following change in your medication:  1) Decrease Bystolic to 5mg  daily

## 2013-04-02 NOTE — Assessment & Plan Note (Signed)
His St. Jude DDD PM is working normally. Will recheck in several months. 

## 2013-04-08 ENCOUNTER — Encounter: Payer: Self-pay | Admitting: Interventional Cardiology

## 2013-04-09 DIAGNOSIS — H6120 Impacted cerumen, unspecified ear: Secondary | ICD-10-CM | POA: Diagnosis not present

## 2013-04-09 DIAGNOSIS — H7010 Chronic mastoiditis, unspecified ear: Secondary | ICD-10-CM | POA: Diagnosis not present

## 2013-04-17 ENCOUNTER — Encounter: Payer: Self-pay | Admitting: Internal Medicine

## 2013-04-18 DIAGNOSIS — M545 Low back pain, unspecified: Secondary | ICD-10-CM | POA: Diagnosis not present

## 2013-04-18 DIAGNOSIS — M549 Dorsalgia, unspecified: Secondary | ICD-10-CM | POA: Diagnosis not present

## 2013-05-01 DIAGNOSIS — E782 Mixed hyperlipidemia: Secondary | ICD-10-CM | POA: Diagnosis not present

## 2013-05-01 DIAGNOSIS — L02619 Cutaneous abscess of unspecified foot: Secondary | ICD-10-CM | POA: Diagnosis not present

## 2013-05-22 ENCOUNTER — Ambulatory Visit: Payer: Self-pay | Admitting: Interventional Cardiology

## 2013-05-27 DIAGNOSIS — R0982 Postnasal drip: Secondary | ICD-10-CM | POA: Diagnosis not present

## 2013-06-04 DIAGNOSIS — H7010 Chronic mastoiditis, unspecified ear: Secondary | ICD-10-CM | POA: Diagnosis not present

## 2013-06-11 ENCOUNTER — Ambulatory Visit: Payer: Self-pay | Admitting: Interventional Cardiology

## 2013-06-12 DIAGNOSIS — R070 Pain in throat: Secondary | ICD-10-CM | POA: Diagnosis not present

## 2013-06-12 DIAGNOSIS — J392 Other diseases of pharynx: Secondary | ICD-10-CM | POA: Diagnosis not present

## 2013-06-17 DIAGNOSIS — G4730 Sleep apnea, unspecified: Secondary | ICD-10-CM | POA: Diagnosis not present

## 2013-06-17 DIAGNOSIS — Q892 Congenital malformations of other endocrine glands: Secondary | ICD-10-CM | POA: Diagnosis not present

## 2013-06-17 DIAGNOSIS — Q382 Macroglossia: Secondary | ICD-10-CM | POA: Diagnosis not present

## 2013-06-17 DIAGNOSIS — H7010 Chronic mastoiditis, unspecified ear: Secondary | ICD-10-CM | POA: Diagnosis not present

## 2013-06-20 DIAGNOSIS — E0500 Thyrotoxicosis with diffuse goiter without thyrotoxic crisis or storm: Secondary | ICD-10-CM | POA: Diagnosis not present

## 2013-06-20 DIAGNOSIS — E890 Postprocedural hypothyroidism: Secondary | ICD-10-CM | POA: Diagnosis not present

## 2013-06-23 ENCOUNTER — Ambulatory Visit: Payer: BC Managed Care – PPO | Admitting: Interventional Cardiology

## 2013-06-25 ENCOUNTER — Telehealth: Payer: Self-pay | Admitting: Interventional Cardiology

## 2013-06-25 ENCOUNTER — Emergency Department (HOSPITAL_COMMUNITY)
Admission: EM | Admit: 2013-06-25 | Discharge: 2013-06-25 | Disposition: A | Discharge status: Home / Self Care | Payer: BC Managed Care – PPO | Attending: Emergency Medicine | Admitting: Emergency Medicine

## 2013-06-25 ENCOUNTER — Encounter (HOSPITAL_COMMUNITY): Payer: Self-pay | Admitting: Emergency Medicine

## 2013-06-25 ENCOUNTER — Emergency Department (HOSPITAL_COMMUNITY): Payer: BC Managed Care – PPO

## 2013-06-25 ENCOUNTER — Other Ambulatory Visit: Payer: Self-pay | Admitting: Physician Assistant

## 2013-06-25 DIAGNOSIS — Z8709 Personal history of other diseases of the respiratory system: Secondary | ICD-10-CM | POA: Insufficient documentation

## 2013-06-25 DIAGNOSIS — E669 Obesity, unspecified: Secondary | ICD-10-CM | POA: Diagnosis not present

## 2013-06-25 DIAGNOSIS — E890 Postprocedural hypothyroidism: Secondary | ICD-10-CM | POA: Diagnosis not present

## 2013-06-25 DIAGNOSIS — Z950 Presence of cardiac pacemaker: Secondary | ICD-10-CM | POA: Diagnosis not present

## 2013-06-25 DIAGNOSIS — Z87891 Personal history of nicotine dependence: Secondary | ICD-10-CM | POA: Diagnosis not present

## 2013-06-25 DIAGNOSIS — R1010 Upper abdominal pain, unspecified: Secondary | ICD-10-CM

## 2013-06-25 DIAGNOSIS — E039 Hypothyroidism, unspecified: Secondary | ICD-10-CM | POA: Diagnosis present

## 2013-06-25 DIAGNOSIS — Z87448 Personal history of other diseases of urinary system: Secondary | ICD-10-CM | POA: Diagnosis not present

## 2013-06-25 DIAGNOSIS — Z79899 Other long term (current) drug therapy: Secondary | ICD-10-CM | POA: Diagnosis not present

## 2013-06-25 DIAGNOSIS — E782 Mixed hyperlipidemia: Secondary | ICD-10-CM | POA: Insufficient documentation

## 2013-06-25 DIAGNOSIS — Z7982 Long term (current) use of aspirin: Secondary | ICD-10-CM | POA: Diagnosis not present

## 2013-06-25 DIAGNOSIS — R079 Chest pain, unspecified: Secondary | ICD-10-CM | POA: Diagnosis not present

## 2013-06-25 DIAGNOSIS — E7800 Pure hypercholesterolemia, unspecified: Secondary | ICD-10-CM | POA: Diagnosis not present

## 2013-06-25 DIAGNOSIS — I10 Essential (primary) hypertension: Secondary | ICD-10-CM | POA: Diagnosis not present

## 2013-06-25 DIAGNOSIS — R0789 Other chest pain: Secondary | ICD-10-CM | POA: Diagnosis present

## 2013-06-25 DIAGNOSIS — K7689 Other specified diseases of liver: Secondary | ICD-10-CM | POA: Diagnosis not present

## 2013-06-25 DIAGNOSIS — F411 Generalized anxiety disorder: Secondary | ICD-10-CM | POA: Diagnosis not present

## 2013-06-25 DIAGNOSIS — Z87442 Personal history of urinary calculi: Secondary | ICD-10-CM | POA: Diagnosis not present

## 2013-06-25 DIAGNOSIS — R109 Unspecified abdominal pain: Secondary | ICD-10-CM

## 2013-06-25 DIAGNOSIS — Z8719 Personal history of other diseases of the digestive system: Secondary | ICD-10-CM | POA: Insufficient documentation

## 2013-06-25 DIAGNOSIS — E0590 Thyrotoxicosis, unspecified without thyrotoxic crisis or storm: Secondary | ICD-10-CM | POA: Insufficient documentation

## 2013-06-25 LAB — CBC
HEMATOCRIT: 46.4 % (ref 39.0–52.0)
Hemoglobin: 15.4 g/dL (ref 13.0–17.0)
MCH: 30.7 pg (ref 26.0–34.0)
MCHC: 33.2 g/dL (ref 30.0–36.0)
MCV: 92.4 fL (ref 78.0–100.0)
PLATELETS: 147 10*3/uL — AB (ref 150–400)
RBC: 5.02 MIL/uL (ref 4.22–5.81)
RDW: 12.7 % (ref 11.5–15.5)
WBC: 5.4 10*3/uL (ref 4.0–10.5)

## 2013-06-25 LAB — COMPREHENSIVE METABOLIC PANEL
ALBUMIN: 3.8 g/dL (ref 3.5–5.2)
ALK PHOS: 34 U/L — AB (ref 39–117)
ALT: 12 U/L (ref 0–53)
AST: 15 U/L (ref 0–37)
BUN: 18 mg/dL (ref 6–23)
CHLORIDE: 100 meq/L (ref 96–112)
CO2: 25 mEq/L (ref 19–32)
Calcium: 8.9 mg/dL (ref 8.4–10.5)
Creatinine, Ser: 1.01 mg/dL (ref 0.50–1.35)
GFR calc Af Amer: 85 mL/min — ABNORMAL LOW (ref >90)
GFR calc non Af Amer: 73 mL/min — ABNORMAL LOW (ref >90)
GLUCOSE: 103 mg/dL — AB (ref 70–99)
POTASSIUM: 4.5 meq/L (ref 3.7–5.3)
SODIUM: 138 meq/L (ref 137–147)
TOTAL PROTEIN: 7.1 g/dL (ref 6.0–8.3)
Total Bilirubin: 0.4 mg/dL (ref 0.3–1.2)

## 2013-06-25 LAB — LIPASE, BLOOD: Lipase: 22 U/L (ref 11–59)

## 2013-06-25 LAB — TROPONIN I

## 2013-06-25 LAB — PRO B NATRIURETIC PEPTIDE: Pro B Natriuretic peptide (BNP): 154.8 pg/mL — ABNORMAL HIGH (ref 0–125)

## 2013-06-25 MED ORDER — ASPIRIN 325 MG PO TABS
325.0000 mg | ORAL_TABLET | Freq: Once | ORAL | Status: AC
  Administered 2013-06-25: 325 mg via ORAL
  Filled 2013-06-25: qty 1

## 2013-06-25 NOTE — Telephone Encounter (Signed)
Patient called in with deep sharp pain in chest/SOB. Sent call to [facility] in triage and she spoke to patient. Please call and advise.

## 2013-06-25 NOTE — ED Provider Notes (Addendum)
CSN: [id number]     Arrival date & time 06/25/13  1201 History   First MD Initiated Contact with Patient 06/25/13 1217     Chief Complaint  Patient presents with  . Chest Pain      HPI Patient presents to the emergency department with intermittent chest discomfort over the past several days.  When his chest discomfort comes on it does make him somewhat short of breath.  He has no history of coronary artery disease.  He describes as a central discomfort with radiation through to his back.  He has no radiation up to his shoulders are up to his jaw.  At this time has no significant discomfort.  He has had a pacemaker placed for bradycardia.  He has a history of hypercholesterolemia, hypertension, paroxysmal atrial fibrillation, family history of heart disease.  He has no prior history of gastroesophageal reflux disease.  No recent cough or congestion.  No active shortness of breath.  No unilateral leg swelling.  No history of pulmonary embolism.  No recent long travel or surgery.   Past Medical History  Diagnosis Date  . Hypercholesterolemia   . Hyperthyroidism   . Mixed hyperlipidemia   . Lump or mass in breast   . Essential hypertension, benign   . Allergic rhinitis, cause unspecified   . Calculus of kidney   . Atrial fibrillation   . Anxiety state, unspecified   . Hematuria, unspecified   . ED (erectile dysfunction)   . Postablative hypothyroidism   . Graves disease   . Diverticulosis   . Ureteral stone     Dr. Risa Grill  . Obesity    Past Surgical History  Procedure Laterality Date  . Tonsillectomy and adenoidectomy    . Anterior cruciate ligament repair Right 1994  . Other surgical history  1995    Ear surgery due to mastoiditis  . Meniscus repair Left 2004  . Shoulder surgery Right 11/2002  . Foot surgery Left 2005    Fot repair after chainsaw accident  . Pacemaker insertion  11/26/08    [name]  . Shoulder surgery Right 2011  . Cataract extraction, bilateral  08/2010  .  Flexible sigmoidoscopy  2001  . Colonoscopy  2001, 2008   Family History  Problem Relation Age of Onset  . CVA Mother   . Hypercholesterolemia Mother   . Goiter Mother   . Diabetes Mother     DM  . Hypertension Mother   . CAD Mother   . Cirrhosis Father     due to alcohol  . Cancer Sister     Breast  . Hypercholesterolemia Brother   . Hypertension Brother   . CAD Brother   . Heart attack Son 40   History  Substance Use Topics  . Smoking status: Former Smoker    Types: Cigarettes    Quit date: 01/16/2006  . Smokeless tobacco: Not on file     Comment: 1/2-1 pack a day  . Alcohol Use: Yes     Comment: rare glass of wine    Review of Systems  All other systems reviewed and are negative.     Allergies  Lipitor  Home Medications   Prior to Admission medications   Medication Sig Start Date End Date Taking? Authorizing Provider  ALPRAZolam Duanne Moron) 1 MG tablet Take 0.5 mg by mouth every 6 (six) hours as needed for anxiety.    Historical Provider, MD  aspirin 325 MG tablet Take 325 mg by mouth daily.  Historical Provider, MD  levothyroxine (SYNTHROID, LEVOTHROID) 175 MCG tablet Take 175 mcg by mouth daily at 8 pm.    Historical Provider, MD  lisinopril (PRINIVIL,ZESTRIL) 5 MG tablet Take 5 mg by mouth daily.    Historical Provider, MD  nebivolol (BYSTOLIC) 10 MG tablet Take 0.5 tablets (5 mg total) by mouth daily. 04/02/13   Evans Lance, MD  pravastatin (PRAVACHOL) 40 MG tablet Take 40 mg by mouth daily.    Historical Provider, MD   BP 124/78  Pulse 60  Temp(Src) 98.3 F (36.8 C) (Oral)  Resp 18  SpO2 94% Physical Exam  Nursing note and vitals reviewed. Constitutional: He is oriented to person, place, and time. He appears well-developed and well-nourished.  HENT:  Head: Normocephalic and atraumatic.  Eyes: EOM are normal.  Neck: Normal range of motion.  Cardiovascular: Normal rate, regular rhythm, normal heart sounds and intact distal pulses.    Pulmonary/Chest: Effort normal and breath sounds normal. No respiratory distress.  Abdominal: Soft. He exhibits no distension. There is no tenderness.  Musculoskeletal: Normal range of motion.  Neurological: He is alert and oriented to person, place, and time.  Skin: Skin is warm and dry.  Psychiatric: He has a normal mood and affect. Judgment normal.    ED Course  Procedures (including critical care time) Labs Review Labs Reviewed  CBC - Abnormal; Notable for the following:    Platelets 147 (*)    All other components within normal limits  PRO B NATRIURETIC PEPTIDE - Abnormal; Notable for the following:    Pro B Natriuretic peptide (BNP) 154.8 (*)    All other components within normal limits  COMPREHENSIVE METABOLIC PANEL - Abnormal; Notable for the following:    Glucose, Bld 103 (*)    Alkaline Phosphatase 34 (*)    GFR calc non Af Amer 73 (*)    GFR calc Af Amer 85 (*)    All other components within normal limits  LIPASE, BLOOD  TROPONIN I  TROPONIN I  Randolm Idol, ED    Imaging Review Dg Chest 2 View  06/25/2013   CLINICAL DATA:  Chest pain.  EXAM: CHEST  2 VIEW  COMPARISON:  PA and lateral chest x-ray of November 30, 2008  FINDINGS: The lungs are adequately inflated and clear. The heart and mediastinal structures are normal. The permanent pacemaker is unchanged in position. There is no pleural effusion. The bony thorax is unremarkable.  IMPRESSION: There is no acute cardiopulmonary disease.   Electronically Signed   By: David  Martinique   On: 06/25/2013 13:43   US Abdomen Complete  06/25/2013   CLINICAL DATA:  Pain  EXAM: ULTRASOUND ABDOMEN COMPLETE  COMPARISON:  CT abdomen and pelvis August 06, 2009  FINDINGS: Gallbladder:  No gallstones or wall thickening visualized. There is no pericholecystic fluid. No sonographic Murphy sign noted.  Common bile duct:  Diameter: 5 mm. There is no intrahepatic, common hepatic, or common bile duct dilatation.  Liver:  The liver  echogenicity is increased. There is a minimally complex cystic area in the right lobe of the liver near the gallbladder fossa measuring 1.3 x 0.8 cm, stable from the prior CT examination.  IVC:  No abnormality visualized.  Pancreas:  Visualized portion unremarkable. Portions of the pancreas are obscured by gas.  Spleen:  Size and appearance within normal limits.  Right Kidney:  Length: 11.2 cm. Echogenicity within normal limits. No mass or hydronephrosis visualized.  Left Kidney:  Length: 12.0 cm. Echogenicity within  normal limits. No mass or hydronephrosis visualized. There is a junctional parenchymal defect in the left mid kidney, an anatomic variant.  Abdominal aorta:  No aneurysm visualized.  Other findings:  No demonstrable ascites.  IMPRESSION: Stable cystic area in near the gallbladder fossa in the liver. Junctional parenchymal defect in left kidney, an anatomic variant. Liver shows increased echogenicity consistent with hepatic steatosis. While no focal liver lesions are identified, it must be cautioned that the sensitivity of ultrasound for focal liver lesions is diminished in this circumstance. Portions of pancreas are obscured by gas. Visualized portions of pancreas appear normal.   Electronically Signed   By: Lowella Grip M.D.   On: 06/25/2013 15:20  I personally reviewed the imaging tests through PACS system I reviewed available ER/hospitalization records through the EMR    EKG Interpretation   Date/Time:  Wednesday June 25 2013 12:13:20 EDT Ventricular Rate:  60 PR Interval:  156 QRS Duration: 88 QT Interval:  416 QTC Calculation: 416 R Axis:   -24 Text Interpretation:  Electronic atrial pacemaker Low voltage QRS  Borderline ECG No significant change was found Confirmed by Lounell Schumacher  MD,  Lennette Bihari ([id number]) on 06/25/2013 12:17:49 PM      MDM   Final diagnoses:  None    Patient both atypical and typical components.  He has a paced EKG.  His initial troponin is negative.  I  consider the possibility of gallstones but his ultrasound demonstrates no cholelithiasis.  Patient will receive a second cardiac enzyme at this time and have asked the cardiology evaluate the patient the bedside.  I suspect the patient can be discharged home from the emergency department with possible outpatient noninvasive testing.  Followup cardiology to evaluate the patient and decide the appropriate modality for ongoing risk stratification.    Hoy Morn, MD 06/25/13 [place], MD 06/25/13 [phone]

## 2013-06-25 NOTE — Discharge Instructions (Signed)
Return to the ED with any concerns including difficulty breathing, chest pain, fainting, vomiting and not able to keep down liquids, decreased level of alertness/lethargy, or any other alarming symptoms 

## 2013-06-25 NOTE — ED Notes (Signed)
Per pt sts chest pain for the past couple of days. sts that he thought it was a pulled muscle and then now it is hurting when he is just sitting there. sts hard for him to get a deep breath.

## 2013-06-25 NOTE — H&P (Signed)
  Arthur Daniel is an 73 y.o. male.   Chief Complaint:  Chest Pain HPI:   The patient is a 73 yo male with a history of tachy-brady syndrome with St Jude PPM implanted Nov 2010.   It was check in March 2015 by Dr. Taylor and was operating normally.  He's been in sinus rhythm 100% of the time.   His history also includes HLD, graves diease, HTN, PAF, obesity.  He presents with chest pain.  He reports that for the last 3-4 days he has had sporadic, "sharp" CP, which last 15-30 secs, and radiates to his back.   At the worst it was 12/10 in intensity.  It does not correlated to activity or food intact.  It does appear to worsen with rotation or movement of his torso and with a deep breath.  He also gets a little SOB, clammy and feels sore in the chest after the initial CP subsides.    Past Medical History  Diagnosis Date  . Hypercholesterolemia   . Hyperthyroidism   . Mixed hyperlipidemia   . Lump or mass in breast   . Essential hypertension, benign   . Allergic rhinitis, cause unspecified   . Calculus of kidney   . Atrial fibrillation   . Anxiety state, unspecified   . Hematuria, unspecified   . ED (erectile dysfunction)   . Postablative hypothyroidism   . Graves disease   . Diverticulosis   . Ureteral stone     Dr. Grapey  . Obesity     Past Surgical History  Procedure Laterality Date  . Tonsillectomy and adenoidectomy    . Anterior cruciate ligament repair Right 1994  . Other surgical history  1995    Ear surgery due to mastoiditis  . Meniscus repair Left 2004  . Shoulder surgery Right 11/2002  . Foot surgery Left 2005    Fot repair after chainsaw accident  . Pacemaker insertion  11/26/08    Klein  . Shoulder surgery Right 2011  . Cataract extraction, bilateral  08/2010  . Flexible sigmoidoscopy  2001  . Colonoscopy  2001, 2008    Family History  Problem Relation Age of Onset  . CVA Mother   . Hypercholesterolemia Mother   . Goiter Mother   . Diabetes Mother    DM  . Hypertension Mother   . CAD Mother   . Cirrhosis Father     due to alcohol  . Cancer Sister     Breast  . Hypercholesterolemia Brother   . Hypertension Brother   . CAD Brother   . Heart attack Son 40   Social History:  reports that he quit smoking about 7 years ago. His smoking use included Cigarettes. He smoked 0.00 packs per day. He does not have any smokeless tobacco history on file. He reports that he drinks alcohol. He reports that he does not use illicit drugs.  Allergies:  Allergies  Allergen Reactions  . Lipitor [Atorvastatin] Other (See Comments)    Breast lumps     (Not in a hospital admission)  Results for orders placed during the hospital encounter of 06/25/13 (from the past 48 hour(s))  CBC     Status: Abnormal   Collection Time    06/25/13 12:31 PM      Result Value Ref Range   WBC 5.4  4.0 - 10.5 K/uL   RBC 5.02  4.22 - 5.81 MIL/uL   Hemoglobin 15.4  13.0 - 17.0 g/dL   HCT   46.4  39.0 - 52.0 %   MCV 92.4  78.0 - 100.0 fL   MCH 30.7  26.0 - 34.0 pg   MCHC 33.2  30.0 - 36.0 g/dL   RDW 12.7  11.5 - 15.5 %   Platelets 147 (*) 150 - 400 K/uL  PRO B NATRIURETIC PEPTIDE     Status: Abnormal   Collection Time    06/25/13 12:31 PM      Result Value Ref Range   Pro B Natriuretic peptide (BNP) 154.8 (*) 0 - 125 pg/mL  COMPREHENSIVE METABOLIC PANEL     Status: Abnormal   Collection Time    06/25/13 12:31 PM      Result Value Ref Range   Sodium 138  137 - 147 mEq/L   Potassium 4.5  3.7 - 5.3 mEq/L   Chloride 100  96 - 112 mEq/L   CO2 25  19 - 32 mEq/L   Glucose, Bld 103 (*) 70 - 99 mg/dL   BUN 18  6 - 23 mg/dL   Creatinine, Ser 1.01  0.50 - 1.35 mg/dL   Calcium 8.9  8.4 - 10.5 mg/dL   Total Protein 7.1  6.0 - 8.3 g/dL   Albumin 3.8  3.5 - 5.2 g/dL   AST 15  0 - 37 U/L   ALT 12  0 - 53 U/L   Alkaline Phosphatase 34 (*) 39 - 117 U/L   Total Bilirubin 0.4  0.3 - 1.2 mg/dL   GFR calc non Af Amer 73 (*) >90 mL/min   GFR calc Af Amer 85 (*) >90 mL/min    Comment: (NOTE)     The eGFR has been calculated using the CKD EPI equation.     This calculation has not been validated in all clinical situations.     eGFR's persistently <90 mL/min signify possible Chronic Kidney     Disease.  LIPASE, BLOOD     Status: None   Collection Time    06/25/13 12:31 PM      Result Value Ref Range   Lipase 22  11 - 59 U/L  TROPONIN I     Status: None   Collection Time    06/25/13 12:31 PM      Result Value Ref Range   Troponin I <0.30  <0.30 ng/mL   Comment:            Due to the release kinetics of cTnI,     a negative result within the first hours     of the onset of symptoms does not rule out     myocardial infarction with certainty.     If myocardial infarction is still suspected,     repeat the test at appropriate intervals.  TROPONIN I     Status: None   Collection Time    06/25/13  3:28 PM      Result Value Ref Range   Troponin I <0.30  <0.30 ng/mL   Comment:            Due to the release kinetics of cTnI,     a negative result within the first hours     of the onset of symptoms does not rule out     myocardial infarction with certainty.     If myocardial infarction is still suspected,     repeat the test at appropriate intervals.   Dg Chest 2 View  06/25/2013   CLINICAL DATA:  Chest pain.  EXAM: CHEST    2 VIEW  COMPARISON:  PA and lateral chest x-ray of November 30, 2008  FINDINGS: The lungs are adequately inflated and clear. The heart and mediastinal structures are normal. The permanent pacemaker is unchanged in position. There is no pleural effusion. The bony thorax is unremarkable.  IMPRESSION: There is no acute cardiopulmonary disease.   Electronically Signed   By: David  Martinique   On: 06/25/2013 13:43   US Abdomen Complete  06/25/2013   CLINICAL DATA:  Pain  EXAM: ULTRASOUND ABDOMEN COMPLETE  COMPARISON:  CT abdomen and pelvis August 06, 2009  FINDINGS: Gallbladder:  No gallstones or wall thickening visualized. There is no pericholecystic  fluid. No sonographic Murphy sign noted.  Common bile duct:  Diameter: 5 mm. There is no intrahepatic, common hepatic, or common bile duct dilatation.  Liver:  The liver echogenicity is increased. There is a minimally complex cystic area in the right lobe of the liver near the gallbladder fossa measuring 1.3 x 0.8 cm, stable from the prior CT examination.  IVC:  No abnormality visualized.  Pancreas:  Visualized portion unremarkable. Portions of the pancreas are obscured by gas.  Spleen:  Size and appearance within normal limits.  Right Kidney:  Length: 11.2 cm. Echogenicity within normal limits. No mass or hydronephrosis visualized.  Left Kidney:  Length: 12.0 cm. Echogenicity within normal limits. No mass or hydronephrosis visualized. There is a junctional parenchymal defect in the left mid kidney, an anatomic variant.  Abdominal aorta:  No aneurysm visualized.  Other findings:  No demonstrable ascites.  IMPRESSION: Stable cystic area in near the gallbladder fossa in the liver. Junctional parenchymal defect in left kidney, an anatomic variant. Liver shows increased echogenicity consistent with hepatic steatosis. While no focal liver lesions are identified, it must be cautioned that the sensitivity of ultrasound for focal liver lesions is diminished in this circumstance. Portions of pancreas are obscured by gas. Visualized portions of pancreas appear normal.   Electronically Signed   By: Lowella Grip M.D.   On: 06/25/2013 15:20    Review of Systems  Constitutional: Positive for diaphoresis ("Clammy"). Negative for fever.  HENT: Negative for congestion and sore throat.   Respiratory: Positive for shortness of breath (A little). Negative for cough.   Cardiovascular: Positive for chest pain. Negative for palpitations, orthopnea, leg swelling and PND.  Gastrointestinal: Negative for nausea, vomiting, abdominal pain, blood in stool and melena.  Genitourinary: Negative for hematuria.  Musculoskeletal:  Positive for back pain.  Neurological: Negative for dizziness.  All other systems reviewed and are negative.   Blood pressure 114/64, pulse 60, temperature 98.3 F (36.8 C), temperature source Oral, resp. rate 17, SpO2 99.00%. Physical Exam  Nursing note and vitals reviewed. Constitutional: He is oriented to person, place, and time. He appears well-developed.  Obese  HENT:  Head: Normocephalic and atraumatic.  Mouth/Throat: No oropharyngeal exudate.  Eyes: EOM are normal. Pupils are equal, round, and reactive to light. No scleral icterus.  Neck: Normal range of motion. Neck supple.  Cardiovascular: Normal rate, regular rhythm, S1 normal and S2 normal.   No murmur heard. Pulses:      Radial pulses are 2+ on the right side, and 2+ on the left side.       Dorsalis pedis pulses are 2+ on the right side, and 2+ on the left side.  No Carotid Bruit.  Respiratory: Effort normal and breath sounds normal. No respiratory distress. He has no wheezes. He has no rales.  GI: Soft. Bowel sounds  are normal. He exhibits no distension. There is no tenderness.  Musculoskeletal: He exhibits edema (trace LEE). He exhibits no tenderness (Chest).  Lymphadenopathy:    He has no cervical adenopathy.  Neurological: He is alert and oriented to person, place, and time. He exhibits normal muscle tone.  Skin: Skin is warm and dry.  Psychiatric: He has a normal mood and affect.     Assessment/Plan  Principal Problem:   Chest pain, atypical Active Problems:   HYPOTHYROIDISM   HYPERTENSION   Cardiac pacemaker in situ  Plan:   73 yo male with history of St. Jude PPM for tachy/brady in 2010, hypothyoidism, lower back pain and obesity.  No CAD.  Presents with Hervey Ard, CP with radiation to the back.  Only lasts 15-30 seconds.  Two negative troponins and no EKG changes.  His presentation is atypical.  Abd [place] did not show anything acute.  Abd exam benign.  CXR without acute issues.  Sounds more musculoskeletal or  perhaps GI(sounds colicky), however, it does not coinside with eating according to the patient.  He does have a history of tobacco use seven years ago and no recent stress test.  Recommend OP NST.  I do not think pain meds will help given the short lived nature of the pain.    Tarri Fuller, [place] 06/25/2013, 4:10 PM

## 2013-06-25 NOTE — Telephone Encounter (Signed)
Received call from patient he stated he has been having chest pain,pain between shoulder blades,sob for the past 4 to 5 days.Stated he is having chest tightness at present.Rates tightness # 5.Advised he needs to go to [facility] ER.Trish called.

## 2013-06-25 NOTE — H&P (Signed)
Pt. Seen and examined. Agree with the NP/PA-C note as written.  Pleasant 73 yo male patient of Dr. Irish Lack, who sees Dr. Lovena Le for pacemaker.  He has a history of PAF and tachy-brady syndrome. He now presents with upper mid-epigastric pain, which is sharp, radiates to the back, and may be worse after eating (seems to be worse after eating chewing tobacco). The episodes are short-lived and very sharp. There is some mild associated nausea and clamminess. He has a history of etoh use. An abdominal ultrasound was performed which shows NAFLD, unremarkable pancreas and non-specific findings. Cardiac enzymes are negative x 2. Lipase is normal.  This is not likely to be cardiac - pain is over the mid-epigstrium. More concerning for pancreatic or stomach. The pancreas appears normal and lipase is normal. Could be gastritis or gastric ulcer. I would recommend starting a PPI (OTC Nexium 20 mg daily or Prilosec 20 mg daily) for 2 weeks. Follow-up with Dr. Irish Lack - could consider outpatient stress test, since he has not had one for many years.  He does not require admission.  Thanks for the consult.  Pixie Casino, MD, [facility] Attending Cardiologist [place]

## 2013-06-25 NOTE — ED Provider Notes (Signed)
6:19 PM pt seen by cardiology.  They recommend PPI OTC for 2 weeks and f/u with Dr. Irish Lack.    Threasa Beards, MD 06/25/13 [phone]

## 2013-06-25 NOTE — ED Notes (Signed)
Patient transported to X-ray 

## 2013-07-04 DIAGNOSIS — R062 Wheezing: Secondary | ICD-10-CM | POA: Diagnosis not present

## 2013-07-07 ENCOUNTER — Ambulatory Visit (INDEPENDENT_AMBULATORY_CARE_PROVIDER_SITE_OTHER): Payer: BC Managed Care – PPO | Admitting: *Deleted

## 2013-07-07 DIAGNOSIS — I480 Paroxysmal atrial fibrillation: Secondary | ICD-10-CM

## 2013-07-07 DIAGNOSIS — I4891 Unspecified atrial fibrillation: Secondary | ICD-10-CM

## 2013-07-07 LAB — MDC_IDC_ENUM_SESS_TYPE_REMOTE
Battery Remaining Percentage: 74 %
Battery Voltage: 2.95 V
Brady Statistic AP VP Percent: 1 %
Brady Statistic AP VS Percent: 34 %
Brady Statistic AS VP Percent: 1 %
Brady Statistic RA Percent Paced: 33 %
Brady Statistic RV Percent Paced: 1 %
Date Time Interrogation Session: 20150622073649
Implantable Pulse Generator Model: 2210
Implantable Pulse Generator Serial Number: 7079515
Lead Channel Impedance Value: 380 Ohm
Lead Channel Impedance Value: 450 Ohm
Lead Channel Pacing Threshold Amplitude: 1 V
Lead Channel Pacing Threshold Amplitude: 1 V
Lead Channel Pacing Threshold Pulse Width: 0.4 ms
Lead Channel Pacing Threshold Pulse Width: 0.4 ms
Lead Channel Sensing Intrinsic Amplitude: 4.1 mV
Lead Channel Sensing Intrinsic Amplitude: 9.9 mV
Lead Channel Setting Pacing Amplitude: 2 V
Lead Channel Setting Sensing Sensitivity: 2 mV
MDC IDC MSMT BATTERY REMAINING LONGEVITY: 92 mo
MDC IDC SET LEADCHNL RV PACING AMPLITUDE: 2.5 V
MDC IDC SET LEADCHNL RV PACING PULSEWIDTH: 0.4 ms
MDC IDC STAT BRADY AS VS PERCENT: 66 %

## 2013-07-07 NOTE — Progress Notes (Signed)
Remote pacemaker transmission.   

## 2013-07-08 ENCOUNTER — Ambulatory Visit (HOSPITAL_COMMUNITY): Payer: BC Managed Care – PPO | Attending: Cardiology | Admitting: Radiology

## 2013-07-08 VITALS — BP 117/84 | HR 60 | Ht 72.0 in | Wt 264.0 lb

## 2013-07-08 DIAGNOSIS — Z8249 Family history of ischemic heart disease and other diseases of the circulatory system: Secondary | ICD-10-CM | POA: Diagnosis not present

## 2013-07-08 DIAGNOSIS — I10 Essential (primary) hypertension: Secondary | ICD-10-CM | POA: Insufficient documentation

## 2013-07-08 DIAGNOSIS — R079 Chest pain, unspecified: Secondary | ICD-10-CM | POA: Diagnosis not present

## 2013-07-08 DIAGNOSIS — Z87891 Personal history of nicotine dependence: Secondary | ICD-10-CM | POA: Insufficient documentation

## 2013-07-08 DIAGNOSIS — R0602 Shortness of breath: Secondary | ICD-10-CM | POA: Diagnosis not present

## 2013-07-08 MED ORDER — TECHNETIUM TC 99M SESTAMIBI GENERIC - CARDIOLITE
10.8000 | Freq: Once | INTRAVENOUS | Status: AC | PRN
  Administered 2013-07-08: 11 via INTRAVENOUS

## 2013-07-08 MED ORDER — REGADENOSON 0.4 MG/5ML IV SOLN
0.4000 mg | Freq: Once | INTRAVENOUS | Status: AC
  Administered 2013-07-08: 0.4 mg via INTRAVENOUS

## 2013-07-08 MED ORDER — TECHNETIUM TC 99M SESTAMIBI GENERIC - CARDIOLITE
33.0000 | Freq: Once | INTRAVENOUS | Status: AC | PRN
  Administered 2013-07-08: 33 via INTRAVENOUS

## 2013-07-08 NOTE — Progress Notes (Signed)
[facility] [address] [place], [place] [address] [phone]    Cardiology Nuclear Med Study  Arthur Daniel is a 73 y.o. male     MRN : [id number]     DOB: Dec 31, 1940  Procedure Date: 07/08/2013  Nuclear Med Background Indication for Stress Test:  Evaluation for Ischemia and [facility]: 6/15 Chest pain and (-) enzymes History:  PTVP (tachy/brady), Echo ~6 yrs ago (nl per pt.), MPI ~6 yrs ago (normal per pt.) Cardiac Risk Factors: Family History - CAD, History of Smoking, Hypertension and Lipids  Symptoms:  Chest Pain and SOB   Nuclear Pre-Procedure Caffeine/Decaff Intake:  7:30pm NPO After: 7:30pm   Lungs:  clear O2 Sat: 91% on room air. IV 0.9% NS with Angio Cath:  22g  IV Site: L Hand  IV Started by:  Matilde Haymaker, RN  Chest Size (in):  46 Cup Size: n/a  Height: 6' (1.829 m)  Weight:  264 lb (119.75 kg)  BMI:  Body mass index is 35.8 kg/(m^2). Tech Comments:  No Bystolic x 18 hrs    Nuclear Med Study 1 or 2 day study: 1 day  Stress Test Type:  Treadmill/Lexiscan  Reading MD: n/a  Order Authorizing Provider:  Rowan Blase  Resting Radionuclide: Technetium 57m Sestamibi  Resting Radionuclide Dose: 11.0 mCi   Stress Radionuclide:  Technetium 51m Sestamibi  Stress Radionuclide Dose: 33.0 mCi           Stress Protocol Rest HR: 60 Stress HR: 87  Rest BP: 117/84 Stress BP: 105/65  Exercise Time (min): n/a METS: n/a           Dose of Adenosine (mg):  n/a Dose of Lexiscan: 0.4 mg  Dose of Atropine (mg): n/a Dose of Dobutamine: n/a mcg/kg/min (at max HR)  Stress Test Technologist: Glade Lloyd, BS-ES  Nuclear Technologist:  Charlton Amor, CNMT     Rest Procedure:  Myocardial perfusion imaging was performed at rest 45 minutes following the intravenous administration of Technetium 60m Sestamibi. Rest ECG: SR occasional A pacing  Stress Procedure:  The patient received IV Lexiscan 0.4 mg over 15-seconds with concurrent  low level exercise and then Technetium 57m Sestamibi was injected at 30-seconds while the patient continued walking one more minute.  Quantitative spect images were obtained after a 45-minute delay.  During the infusion of Lexiscan the patient complained of feeling flushed and stomach cramps.  These symptoms began to resolve in recovery.  Stress ECG: No significant change from baseline ECG  QPS Raw Data Images:  Normal; no motion artifact; normal heart/lung ratio. Stress Images:  There is decreased uptake in the anterior wall. Rest Images:  Normal homogeneous uptake in all areas of the myocardium. Subtraction (SDS):  apical ischemia Transient Ischemic Dilatation (Normal <1.22):  1.01 Lung/Heart Ratio (Normal <0.45):  0.33  Quantitative Gated Spect Images QGS EDV:  112 ml QGS ESV:  43 ml  Impression Exercise Capacity:  Lexiscan with low level exercise. BP Response:  Normal blood pressure response. Clinical Symptoms:  Flushed ECG Impression:  No significant ST segment change suggestive of ischemia. Comparison with Prior Nuclear Study: No images to compare  Overall Impression:  Low risk stress nuclear study Small area of moderate inferoapical ischemia.  LV Ejection Fraction: 61%.  LV Wall Motion:  NL LV Function; NL Wall Motion  Jenkins Rouge

## 2013-07-17 ENCOUNTER — Encounter: Payer: Self-pay | Admitting: Interventional Cardiology

## 2013-07-17 ENCOUNTER — Ambulatory Visit (INDEPENDENT_AMBULATORY_CARE_PROVIDER_SITE_OTHER): Payer: BC Managed Care – PPO | Admitting: Interventional Cardiology

## 2013-07-17 VITALS — BP 98/62 | HR 64 | Ht 72.0 in | Wt 269.0 lb

## 2013-07-17 DIAGNOSIS — I4891 Unspecified atrial fibrillation: Secondary | ICD-10-CM

## 2013-07-17 DIAGNOSIS — I480 Paroxysmal atrial fibrillation: Secondary | ICD-10-CM

## 2013-07-17 DIAGNOSIS — R5381 Other malaise: Secondary | ICD-10-CM | POA: Diagnosis not present

## 2013-07-17 DIAGNOSIS — R0789 Other chest pain: Secondary | ICD-10-CM | POA: Diagnosis not present

## 2013-07-17 DIAGNOSIS — I10 Essential (primary) hypertension: Secondary | ICD-10-CM | POA: Diagnosis not present

## 2013-07-17 DIAGNOSIS — R5383 Other fatigue: Secondary | ICD-10-CM

## 2013-07-17 MED ORDER — LISINOPRIL 5 MG PO TABS: 2.5000 mg | ORAL_TABLET | Freq: Every day | ORAL | Status: DC

## 2013-07-17 MED ORDER — LISINOPRIL 2.5 MG PO TABS: 2.5000 mg | ORAL_TABLET | Freq: Every day | ORAL | Status: DC

## 2013-07-17 NOTE — Progress Notes (Signed)
Patient ID: Arthur Daniel, male   DOB: December 19, 1940, 73 y.o.   MRN: [id number]    [address] [place], [place]  [address] Phone: [phone] Fax:  [phone]  Date:  07/17/2013   ID:  Arthur, Daniel 1940-06-05, MRN [id number]  PCP:  Beatris Si      History of Present Illness: Arthur Daniel is a 73 y.o. male with AFib and pacer. HE has not felt any significant palpitations. Thyroid has been regulated. He is active around the house. Atrial Fibrillation F/U:  Not exercising. Denies :   Dizziness.  Leg edema.  Orthopnea.  Palpitations.  Shortness of breath.  Syncope.   One episode of chest pain.    Wt Readings from Last 3 Encounters:  07/17/13 269 lb (122.018 kg)  07/08/13 264 lb (119.75 kg)  04/02/13 273 lb 12 oz (124.172 kg)     Past Medical History  Diagnosis Date  . Hypercholesterolemia   . Hyperthyroidism   . Mixed hyperlipidemia   . Lump or mass in breast   . Essential hypertension, benign   . Allergic rhinitis, cause unspecified   . Calculus of kidney   . Atrial fibrillation   . Anxiety state, unspecified   . Hematuria, unspecified   . ED (erectile dysfunction)   . Postablative hypothyroidism   . Graves disease   . Diverticulosis   . Ureteral stone     Dr. Risa Grill  . Obesity     Current Outpatient Prescriptions  Medication Sig Dispense Refill  . ALPRAZolam (XANAX) 1 MG tablet Take 0.5 mg by mouth every 6 (six) hours as needed for anxiety.      Marland Kitchen aspirin 325 MG tablet Take 325 mg by mouth daily.      Marland Kitchen levothyroxine (SYNTHROID, LEVOTHROID) 175 MCG tablet Take 175 mcg by mouth daily before breakfast.       . lisinopril (PRINIVIL,ZESTRIL) 5 MG tablet Take 5 mg by mouth daily.      . nebivolol (BYSTOLIC) 10 MG tablet Take 5 mg by mouth daily.      . pravastatin (PRAVACHOL) 40 MG tablet Take 40 mg by mouth daily.       No current facility-administered medications for this visit.    Allergies:    Allergies  Allergen Reactions   . Lipitor [Atorvastatin] Other (See Comments)    Breast lumps    Social History:  The patient  reports that he quit smoking about 7 years ago. His smoking use included Cigarettes. He smoked 0.00 packs per day. He does not have any smokeless tobacco history on file. He reports that he drinks alcohol. He reports that he does not use illicit drugs.   Family History:  The patient's family history includes CAD in his brother and mother; CVA in his mother; Cancer in his sister; Cirrhosis in his father; Diabetes in his mother; Goiter in his mother; Heart attack (age of onset: 59) in his son; Hypercholesterolemia in his brother and mother; Hypertension in his brother and mother.   ROS:  Please see the history of present illness.  No nausea, vomiting.  No fevers, chills.  No focal weakness.  No dysuria.   All other systems reviewed and negative.   PHYSICAL EXAM: VS:  BP 98/62  Pulse 64  Ht 6' (1.829 m)  Wt 269 lb (122.018 kg)  BMI 36.48 kg/m2 Well nourished, well developed, in no acute distress HEENT: normal Neck: no JVD, no carotid bruits Cardiac:  normal  S1, S2; RRR;  Lungs:  clear to auscultation bilaterally, no wheezing, rhonchi or rales Abd: soft, nontender, no hepatomegaly Ext: no edema Skin: warm and dry Neuro:   no focal abnormalities noted  EKG:  Normal  ASSESSMENT AND PLAN:  A-fib  Continue Aspirin Tablet, 325 MG, 1 tablet, Orally, Once a day Continue Bystolic Tablet, 5 MG, 1 tablet, Orally, once a day- BP still low; often right around 469 systolic Notes: Maintaining NSR.  Continue pacer checks.atrial fibrillation occurred in the setting of hyperthyroidism.    2. Mixed hyperlipidemia  Continue Pravachol Tablet, 40 MG, 1 tablet, Orally, Once a day Notes: LDL 127 in 4/15, much better. He took fish oil in the past.    3. Essential hypertension, benign  Decrease Lisinopril Tablet, 5 MG, half tab, Orally, every morning Notes: Usually controlled. Perhaps even too low, with  systolic < 629.  No syncope.  Decrease lisinopril.  If BP remains low, could stop lisinopril   4. Obesity  Notes: COntinue to try to exercise and watch diet to lose weight. Try to avoid chewing tobacco. He eats late at night. Would like to see him less than 250 lbs next year.    5. Chest pain: Went to ER. Negative w/u.  Low risk stress test.  CP occurred in the setting of eating peach cobbler followed by chewing tobacco.  Sx got better with stopping chewing tobacco and taking Nexium.  If he gets more chest pain, could consider cath.  Can use OTC PPI.        Preventive Medicine  Adult topics discussed:  Diet: low calorie, low fat, healthy diet.  Exercise: 5 days a week, at least 30 minutes of aerobic exercise.   avoid chewing tobacco.      Signed, Mina Marble, MD, [facility] 07/17/2013 3:31 PM

## 2013-07-17 NOTE — Patient Instructions (Signed)
Your physician has recommended you make the following change in your medication:   1. Decrease Lisinopril to 2.5 mg daily.   Your physician wants you to follow-up in: 1 year with Dr. Irish Lack. You will receive a reminder letter in the mail two months in advance. If you don't receive a letter, please call our office to schedule the follow-up appointment.

## 2013-07-22 DIAGNOSIS — Q892 Congenital malformations of other endocrine glands: Secondary | ICD-10-CM | POA: Diagnosis not present

## 2013-07-22 DIAGNOSIS — H902 Conductive hearing loss, unspecified: Secondary | ICD-10-CM | POA: Diagnosis not present

## 2013-07-22 DIAGNOSIS — H7010 Chronic mastoiditis, unspecified ear: Secondary | ICD-10-CM | POA: Diagnosis not present

## 2013-07-22 DIAGNOSIS — G4730 Sleep apnea, unspecified: Secondary | ICD-10-CM | POA: Diagnosis not present

## 2013-07-23 ENCOUNTER — Encounter: Payer: Self-pay | Admitting: Cardiology

## 2013-07-29 ENCOUNTER — Encounter: Payer: Self-pay | Admitting: Internal Medicine

## 2013-08-04 DIAGNOSIS — G4730 Sleep apnea, unspecified: Secondary | ICD-10-CM | POA: Diagnosis not present

## 2013-08-04 DIAGNOSIS — Q892 Congenital malformations of other endocrine glands: Secondary | ICD-10-CM | POA: Diagnosis not present

## 2013-08-04 DIAGNOSIS — Z950 Presence of cardiac pacemaker: Secondary | ICD-10-CM | POA: Diagnosis not present

## 2013-08-04 DIAGNOSIS — I519 Heart disease, unspecified: Secondary | ICD-10-CM | POA: Diagnosis not present

## 2013-08-04 DIAGNOSIS — E119 Type 2 diabetes mellitus without complications: Secondary | ICD-10-CM | POA: Diagnosis not present

## 2013-08-04 DIAGNOSIS — E039 Hypothyroidism, unspecified: Secondary | ICD-10-CM | POA: Diagnosis not present

## 2013-08-04 DIAGNOSIS — M549 Dorsalgia, unspecified: Secondary | ICD-10-CM | POA: Diagnosis not present

## 2013-08-04 DIAGNOSIS — I10 Essential (primary) hypertension: Secondary | ICD-10-CM | POA: Diagnosis not present

## 2013-08-04 DIAGNOSIS — N200 Calculus of kidney: Secondary | ICD-10-CM | POA: Diagnosis not present

## 2013-08-04 DIAGNOSIS — M129 Arthropathy, unspecified: Secondary | ICD-10-CM | POA: Diagnosis not present

## 2013-09-23 DIAGNOSIS — G4733 Obstructive sleep apnea (adult) (pediatric): Secondary | ICD-10-CM | POA: Diagnosis not present

## 2013-10-08 ENCOUNTER — Encounter: Payer: BC Managed Care – PPO | Admitting: *Deleted

## 2013-10-08 ENCOUNTER — Telehealth: Payer: Self-pay | Admitting: Cardiology

## 2013-10-08 NOTE — Telephone Encounter (Signed)
LMOVM reminding pt to send remote transmission.   

## 2013-10-09 ENCOUNTER — Encounter: Payer: Self-pay | Admitting: Cardiology

## 2013-10-16 ENCOUNTER — Ambulatory Visit (INDEPENDENT_AMBULATORY_CARE_PROVIDER_SITE_OTHER): Payer: BC Managed Care – PPO | Admitting: *Deleted

## 2013-10-16 ENCOUNTER — Encounter (HOSPITAL_BASED_OUTPATIENT_CLINIC_OR_DEPARTMENT_OTHER): Payer: Self-pay | Admitting: *Deleted

## 2013-10-16 ENCOUNTER — Encounter: Payer: Self-pay | Admitting: Internal Medicine

## 2013-10-16 ENCOUNTER — Other Ambulatory Visit: Payer: Self-pay | Admitting: Orthopedic Surgery

## 2013-10-16 DIAGNOSIS — I480 Paroxysmal atrial fibrillation: Secondary | ICD-10-CM

## 2013-10-16 LAB — MDC_IDC_ENUM_SESS_TYPE_REMOTE
Battery Voltage: 2.95 V
Brady Statistic AP VP Percent: 1 %
Brady Statistic AS VP Percent: 1 %
Brady Statistic RA Percent Paced: 44 %
Brady Statistic RV Percent Paced: 1 %
Implantable Pulse Generator Model: 2210
Implantable Pulse Generator Serial Number: 7079515
Lead Channel Impedance Value: 360 Ohm
Lead Channel Sensing Intrinsic Amplitude: 9.1 mV
Lead Channel Setting Pacing Amplitude: 2 V
Lead Channel Setting Pacing Amplitude: 2.5 V
Lead Channel Setting Pacing Pulse Width: 0.4 ms
Lead Channel Setting Sensing Sensitivity: 2 mV
MDC IDC MSMT BATTERY REMAINING LONGEVITY: 90 mo
MDC IDC MSMT BATTERY REMAINING PERCENTAGE: 74 %
MDC IDC MSMT LEADCHNL RA PACING THRESHOLD AMPLITUDE: 1 V
MDC IDC MSMT LEADCHNL RA PACING THRESHOLD PULSEWIDTH: 0.4 ms
MDC IDC MSMT LEADCHNL RA SENSING INTR AMPL: 3.7 mV
MDC IDC MSMT LEADCHNL RV IMPEDANCE VALUE: 430 Ohm
MDC IDC MSMT LEADCHNL RV PACING THRESHOLD AMPLITUDE: 1 V
MDC IDC MSMT LEADCHNL RV PACING THRESHOLD PULSEWIDTH: 0.4 ms
MDC IDC SESS DTM: 20151001184511
MDC IDC STAT BRADY AP VS PERCENT: 45 %
MDC IDC STAT BRADY AS VS PERCENT: 55 %

## 2013-10-16 NOTE — Progress Notes (Addendum)
Bring all medications and CPAP. Coming tomorrow for [employer]. Faxed  to Dr . Carleene Overlie taylor at [facility] for Pacemaker device.

## 2013-10-16 NOTE — Progress Notes (Signed)
Remote pacemaker transmission.   

## 2013-10-17 ENCOUNTER — Encounter (HOSPITAL_BASED_OUTPATIENT_CLINIC_OR_DEPARTMENT_OTHER)
Admission: RE | Admit: 2013-10-17 | Discharge: 2013-10-17 | Disposition: A | Discharge status: Home / Self Care | Payer: BC Managed Care – PPO | Source: Ambulatory Visit | Attending: Orthopedic Surgery | Admitting: Orthopedic Surgery

## 2013-10-17 DIAGNOSIS — Z950 Presence of cardiac pacemaker: Secondary | ICD-10-CM | POA: Diagnosis not present

## 2013-10-17 DIAGNOSIS — E669 Obesity, unspecified: Secondary | ICD-10-CM | POA: Diagnosis not present

## 2013-10-17 DIAGNOSIS — E782 Mixed hyperlipidemia: Secondary | ICD-10-CM | POA: Diagnosis not present

## 2013-10-17 DIAGNOSIS — Z6836 Body mass index (BMI) 36.0-36.9, adult: Secondary | ICD-10-CM | POA: Diagnosis not present

## 2013-10-17 DIAGNOSIS — N529 Male erectile dysfunction, unspecified: Secondary | ICD-10-CM | POA: Diagnosis not present

## 2013-10-17 DIAGNOSIS — Z87891 Personal history of nicotine dependence: Secondary | ICD-10-CM | POA: Diagnosis not present

## 2013-10-17 DIAGNOSIS — M7542 Impingement syndrome of left shoulder: Secondary | ICD-10-CM | POA: Diagnosis not present

## 2013-10-17 DIAGNOSIS — M19012 Primary osteoarthritis, left shoulder: Secondary | ICD-10-CM | POA: Diagnosis not present

## 2013-10-17 DIAGNOSIS — G4730 Sleep apnea, unspecified: Secondary | ICD-10-CM | POA: Diagnosis not present

## 2013-10-17 DIAGNOSIS — E0590 Thyrotoxicosis, unspecified without thyrotoxic crisis or storm: Secondary | ICD-10-CM | POA: Diagnosis not present

## 2013-10-17 DIAGNOSIS — I10 Essential (primary) hypertension: Secondary | ICD-10-CM | POA: Diagnosis not present

## 2013-10-17 DIAGNOSIS — E890 Postprocedural hypothyroidism: Secondary | ICD-10-CM | POA: Diagnosis not present

## 2013-10-17 DIAGNOSIS — I4891 Unspecified atrial fibrillation: Secondary | ICD-10-CM | POA: Diagnosis not present

## 2013-10-17 LAB — BASIC METABOLIC PANEL
Anion gap: 10 (ref 5–15)
BUN: 13 mg/dL (ref 6–23)
CHLORIDE: 101 meq/L (ref 96–112)
CO2: 29 mEq/L (ref 19–32)
CREATININE: 0.96 mg/dL (ref 0.50–1.35)
Calcium: 9 mg/dL (ref 8.4–10.5)
GFR calc Af Amer: >90 mL/min (ref >90)
GFR calc non Af Amer: 82 mL/min — ABNORMAL LOW (ref >90)
Glucose, Bld: 102 mg/dL — ABNORMAL HIGH (ref 70–99)
POTASSIUM: 4.6 meq/L (ref 3.7–5.3)
Sodium: 140 mEq/L (ref 137–147)

## 2013-10-17 NOTE — H&P (Signed)
Arthur Daniel is an 73 y.o. male.   Chief Complaint: Left Shoulder Pain  HPI: [name] presents today with worsening left shoulder pain.  He received an injection about a year ago, and claims it did not help with his pain.  It has continued to get worse.  Pain is located on the anterior aspect of the shoulder.  He describes it as constant and about a 6/10.  Pain is worsened with lifting and driving.  He does not like to take medications, and nothing seems to make it better.  He does not feel that he wants another injection today.  He just wants it fixed.  Past Medical History  Diagnosis Date  . Hypercholesterolemia   . Mixed hyperlipidemia   . Lump or mass in breast   . Essential hypertension, benign   . Allergic rhinitis, cause unspecified   . Calculus of kidney   . Atrial fibrillation   . Hematuria, unspecified   . ED (erectile dysfunction)   . Postablative hypothyroidism   . Diverticulosis   . Ureteral stone     Dr. Risa Grill  . Obesity   . Pacemaker   . Sleep apnea     Use CPAP machine  . Hyperthyroidism     Past Surgical History  Procedure Laterality Date  . Tonsillectomy and adenoidectomy    . Anterior cruciate ligament repair Right 1994  . Other surgical history  1995    Ear surgery due to mastoiditis  . Meniscus repair Left 2004  . Shoulder surgery Right 11/2002  . Foot surgery Left 2005    Fot repair after chainsaw accident  . Pacemaker insertion  11/26/08    [name]  . Shoulder surgery Right 2011  . Cataract extraction, bilateral  08/2010  . Flexible sigmoidoscopy  2001  . Colonoscopy  2001, 2008  . Thyroglossal duct cyst      Family History  Problem Relation Age of Onset  . CVA Mother   . Hypercholesterolemia Mother   . Goiter Mother   . Diabetes Mother     DM  . Hypertension Mother   . CAD Mother   . Cirrhosis Father     due to alcohol  . Cancer Sister     Breast  . Hypercholesterolemia Brother   . Hypertension Brother   . CAD Brother   . Heart  attack Son 28   Social History:  reports that he quit smoking about 7 years ago. His smoking use included Cigarettes. He smoked 0.00 packs per day. He does not have any smokeless tobacco history on file. He reports that he drinks alcohol. He reports that he does not use illicit drugs.  Allergies:  Allergies  Allergen Reactions  . Lipitor [Atorvastatin] Other (See Comments)    Breast lumps    No prescriptions prior to admission    No results found for this or any previous visit (from the past 48 hour(s)). No results found.  Review of Systems  Constitutional: Negative.   HENT: Negative.   Eyes: Negative.   Respiratory: Negative.   Cardiovascular: Negative.   Gastrointestinal: Negative.   Genitourinary: Negative.   Musculoskeletal: Positive for joint pain.  Skin: Negative.   Neurological: Negative.   Endo/Heme/Allergies: Negative.   Psychiatric/Behavioral: Negative.     Height 6\' 1"  (1.854 m), weight 120.203 kg (265 lb). Physical Exam  Constitutional: He is oriented to person, place, and time. He appears well-developed and well-nourished.  HENT:  Head: Normocephalic and atraumatic.  Eyes: Pupils are  equal, round, and reactive to light.  Neck: Normal range of motion. Neck supple.  Cardiovascular: Intact distal pulses.   Respiratory: Effort normal.  Musculoskeletal:  well-developed, well-nourished 73 year old male in no acute distress sitting comfortably in exam room.  Respirations are regular and unlabored. Skin is intact. Shoulder range of motion: forward flexion-120, external-50, internal-L3.  Rotator cuff appears intact. Good rotator cuff power.  Essentially negative Neers and Hawkins. Positive cross arm test. Slightly tender to palpation along a.c. joint. Crepitus noted with passive range of motion of left shoulder.   Neurological: He is alert and oriented to person, place, and time.  Skin: Skin is warm and dry.  Psychiatric: He has a normal mood and affect. His behavior  is normal. Judgment and thought content normal.     Radiographs: X-rays ordered, performed, and interpreted by Dr. Mayer Camel.  Moderate arthritis of the a.c. joint noted.  Assessment/Plan Assessment: 1. Left shoulder a.c. joint arthritis  2. Minimal impingement  Plan: Patient does not wish to receive another shoulder injection as he does not feel that the first one worked.  With the last injection, after 5 min he reported 90% improvement indicating a high likelihood of improvement with arthroscopy. He has elected to have a left shoulder arthroscopy, and does not wish to have a regional block.  Affan Callow R 10/17/2013, 8:59 AM

## 2013-10-20 ENCOUNTER — Encounter (HOSPITAL_BASED_OUTPATIENT_CLINIC_OR_DEPARTMENT_OTHER): Payer: Self-pay | Admitting: Certified Registered"

## 2013-10-20 ENCOUNTER — Ambulatory Visit (HOSPITAL_BASED_OUTPATIENT_CLINIC_OR_DEPARTMENT_OTHER): Payer: BC Managed Care – PPO | Admitting: Certified Registered"

## 2013-10-20 ENCOUNTER — Encounter (HOSPITAL_BASED_OUTPATIENT_CLINIC_OR_DEPARTMENT_OTHER): Payer: BC Managed Care – PPO | Admitting: Certified Registered"

## 2013-10-20 ENCOUNTER — Ambulatory Visit (HOSPITAL_BASED_OUTPATIENT_CLINIC_OR_DEPARTMENT_OTHER)
Admission: RE | Admit: 2013-10-20 | Discharge: 2013-10-20 | Disposition: A | Discharge status: Home / Self Care | Payer: BC Managed Care – PPO | Source: Ambulatory Visit | Attending: Orthopedic Surgery | Admitting: Orthopedic Surgery

## 2013-10-20 ENCOUNTER — Encounter (HOSPITAL_BASED_OUTPATIENT_CLINIC_OR_DEPARTMENT_OTHER)
Admission: RE | Disposition: A | Discharge status: Home / Self Care | Payer: Self-pay | Source: Ambulatory Visit | Attending: Orthopedic Surgery

## 2013-10-20 DIAGNOSIS — I10 Essential (primary) hypertension: Secondary | ICD-10-CM | POA: Insufficient documentation

## 2013-10-20 DIAGNOSIS — Z6836 Body mass index (BMI) 36.0-36.9, adult: Secondary | ICD-10-CM | POA: Insufficient documentation

## 2013-10-20 DIAGNOSIS — M7542 Impingement syndrome of left shoulder: Secondary | ICD-10-CM | POA: Insufficient documentation

## 2013-10-20 DIAGNOSIS — M19012 Primary osteoarthritis, left shoulder: Secondary | ICD-10-CM | POA: Diagnosis not present

## 2013-10-20 DIAGNOSIS — Z87891 Personal history of nicotine dependence: Secondary | ICD-10-CM | POA: Insufficient documentation

## 2013-10-20 DIAGNOSIS — E890 Postprocedural hypothyroidism: Secondary | ICD-10-CM | POA: Insufficient documentation

## 2013-10-20 DIAGNOSIS — I4891 Unspecified atrial fibrillation: Secondary | ICD-10-CM | POA: Insufficient documentation

## 2013-10-20 DIAGNOSIS — E782 Mixed hyperlipidemia: Secondary | ICD-10-CM | POA: Insufficient documentation

## 2013-10-20 DIAGNOSIS — E669 Obesity, unspecified: Secondary | ICD-10-CM | POA: Insufficient documentation

## 2013-10-20 DIAGNOSIS — E0590 Thyrotoxicosis, unspecified without thyrotoxic crisis or storm: Secondary | ICD-10-CM | POA: Insufficient documentation

## 2013-10-20 DIAGNOSIS — G4730 Sleep apnea, unspecified: Secondary | ICD-10-CM | POA: Insufficient documentation

## 2013-10-20 DIAGNOSIS — Z950 Presence of cardiac pacemaker: Secondary | ICD-10-CM | POA: Insufficient documentation

## 2013-10-20 DIAGNOSIS — N529 Male erectile dysfunction, unspecified: Secondary | ICD-10-CM | POA: Insufficient documentation

## 2013-10-20 HISTORY — DX: Sleep apnea, unspecified: G47.30

## 2013-10-20 HISTORY — PX: SHOULDER ARTHROSCOPY WITH SUBACROMIAL DECOMPRESSION: SHX5684

## 2013-10-20 HISTORY — DX: Presence of cardiac pacemaker: Z95.0

## 2013-10-20 LAB — POCT HEMOGLOBIN-HEMACUE: Hemoglobin: 11.6 g/dL — ABNORMAL LOW (ref 13.0–17.0)

## 2013-10-20 SURGERY — SHOULDER ARTHROSCOPY WITH SUBACROMIAL DECOMPRESSION
Anesthesia: General | Site: Shoulder | Laterality: Left

## 2013-10-20 MED ORDER — FENTANYL CITRATE 0.05 MG/ML IJ SOLN
INTRAMUSCULAR | Status: DC | PRN
  Administered 2013-10-20: 25 ug via INTRAVENOUS
  Administered 2013-10-20: 100 ug via INTRAVENOUS

## 2013-10-20 MED ORDER — KCL IN DEXTROSE-NACL 20-5-0.2 MEQ/L-%-% IV SOLN: INTRAVENOUS | Status: DC

## 2013-10-20 MED ORDER — DEXAMETHASONE SODIUM PHOSPHATE 4 MG/ML IJ SOLN
INTRAMUSCULAR | Status: DC | PRN
  Administered 2013-10-20: 8 mg via INTRAVENOUS

## 2013-10-20 MED ORDER — LIDOCAINE HCL (CARDIAC) 20 MG/ML IV SOLN
INTRAVENOUS | Status: DC | PRN
  Administered 2013-10-20: 50 mg via INTRAVENOUS

## 2013-10-20 MED ORDER — SUCCINYLCHOLINE CHLORIDE 20 MG/ML IJ SOLN
INTRAMUSCULAR | Status: DC | PRN
  Administered 2013-10-20: 100 mg via INTRAVENOUS

## 2013-10-20 MED ORDER — CEFAZOLIN SODIUM 1-5 GM-% IV SOLN
INTRAVENOUS | Status: AC
  Filled 2013-10-20: qty 50

## 2013-10-20 MED ORDER — CEFAZOLIN SODIUM-DEXTROSE 2-3 GM-% IV SOLR
INTRAVENOUS | Status: AC
  Filled 2013-10-20: qty 50

## 2013-10-20 MED ORDER — LACTATED RINGERS IV SOLN
INTRAVENOUS | Status: DC
  Administered 2013-10-20: 11:00:00 via INTRAVENOUS

## 2013-10-20 MED ORDER — PROPOFOL 10 MG/ML IV BOLUS
INTRAVENOUS | Status: DC | PRN
  Administered 2013-10-20: 150 mg via INTRAVENOUS

## 2013-10-20 MED ORDER — BUPIVACAINE-EPINEPHRINE (PF) 0.5% -1:200000 IJ SOLN
INTRAMUSCULAR | Status: AC
  Filled 2013-10-20: qty 30

## 2013-10-20 MED ORDER — HYDROCODONE-ACETAMINOPHEN 5-325 MG PO TABS: 1.0000 | ORAL_TABLET | Freq: Four times a day (QID) | ORAL | Status: DC | PRN

## 2013-10-20 MED ORDER — EPHEDRINE SULFATE 50 MG/ML IJ SOLN
INTRAMUSCULAR | Status: DC | PRN
  Administered 2013-10-20 (×4): 10 mg via INTRAVENOUS

## 2013-10-20 MED ORDER — ONDANSETRON HCL 4 MG/2ML IJ SOLN
INTRAMUSCULAR | Status: DC | PRN
  Administered 2013-10-20: 4 mg via INTRAVENOUS

## 2013-10-20 MED ORDER — MIDAZOLAM HCL 2 MG/2ML IJ SOLN: 1.0000 mg | INTRAMUSCULAR | Status: DC | PRN

## 2013-10-20 MED ORDER — EPINEPHRINE HCL 1 MG/ML IJ SOLN
INTRAMUSCULAR | Status: DC | PRN
  Administered 2013-10-20: 1 mg

## 2013-10-20 MED ORDER — DEXTROSE 5 % IV SOLN
3.0000 g | INTRAVENOUS | Status: AC
  Administered 2013-10-20: 2 g via INTRAVENOUS

## 2013-10-20 MED ORDER — MIDAZOLAM HCL 2 MG/2ML IJ SOLN
INTRAMUSCULAR | Status: AC
  Filled 2013-10-20: qty 2

## 2013-10-20 MED ORDER — FENTANYL CITRATE 0.05 MG/ML IJ SOLN
INTRAMUSCULAR | Status: AC
  Filled 2013-10-20: qty 6

## 2013-10-20 MED ORDER — SUCCINYLCHOLINE CHLORIDE 20 MG/ML IJ SOLN
INTRAMUSCULAR | Status: AC
  Filled 2013-10-20: qty 1

## 2013-10-20 MED ORDER — BUPIVACAINE-EPINEPHRINE 0.5% -1:200000 IJ SOLN
INTRAMUSCULAR | Status: DC | PRN
  Administered 2013-10-20: 30 mL

## 2013-10-20 MED ORDER — FENTANYL CITRATE 0.05 MG/ML IJ SOLN: 50.0000 ug | INTRAMUSCULAR | Status: DC | PRN

## 2013-10-20 MED ORDER — FENTANYL CITRATE 0.05 MG/ML IJ SOLN
INTRAMUSCULAR | Status: AC
  Filled 2013-10-20: qty 2

## 2013-10-20 MED ORDER — FENTANYL CITRATE 0.05 MG/ML IJ SOLN
25.0000 ug | INTRAMUSCULAR | Status: DC | PRN
  Administered 2013-10-20: 50 ug via INTRAVENOUS

## 2013-10-20 MED ORDER — SODIUM CHLORIDE 0.9 % IR SOLN
Status: DC | PRN
  Administered 2013-10-20: 6000 mL

## 2013-10-20 SURGICAL SUPPLY — 65 items
BLADE AVERAGE 25MMX9MM (BLADE)
BLADE AVERAGE 25X9 (BLADE) IMPLANT
BLADE CUTTER GATOR 3.5 (BLADE) ×2 IMPLANT
BLADE GREAT WHITE 4.2 (BLADE) ×2 IMPLANT
BLADE GREAT WHITE 4.2MM (BLADE) ×1
BLADE SURG 15 STRL LF DISP TIS (BLADE) IMPLANT
BLADE SURG 15 STRL SS (BLADE)
BUR EGG 3PK/BX (BURR) IMPLANT
BUR VERTEX HOODED 4.5 (BURR) ×3 IMPLANT
CANISTER SUCT 3000ML (MISCELLANEOUS) IMPLANT
CANNULA 5.75X71 LONG (CANNULA) IMPLANT
CANNULA TWIST IN 8.25X7CM (CANNULA) IMPLANT
DECANTER SPIKE VIAL GLASS SM (MISCELLANEOUS) IMPLANT
DRAPE INCISE IOBAN 66X45 STRL (DRAPES) IMPLANT
DRAPE SHOULDER BEACH CHAIR (DRAPES) ×3 IMPLANT
DURAPREP 26ML APPLICATOR (WOUND CARE) ×5 IMPLANT
ELECT REM PT RETURN 9FT ADLT (ELECTROSURGICAL)
ELECTRODE REM PT RTRN 9FT ADLT (ELECTROSURGICAL) ×1 IMPLANT
GAUZE SPONGE 4X4 12PLY STRL (GAUZE/BANDAGES/DRESSINGS) ×3 IMPLANT
GAUZE XEROFORM 1X8 LF (GAUZE/BANDAGES/DRESSINGS) ×3 IMPLANT
GLOVE BIO SURGEON STRL SZ7.5 (GLOVE) ×3 IMPLANT
GLOVE BIO SURGEON STRL SZ8.5 (GLOVE) ×3 IMPLANT
GLOVE BIOGEL PI IND STRL 7.0 (GLOVE) ×1 IMPLANT
GLOVE BIOGEL PI IND STRL 8 (GLOVE) ×1 IMPLANT
GLOVE BIOGEL PI IND STRL 9 (GLOVE) ×1 IMPLANT
GLOVE BIOGEL PI INDICATOR 7.0 (GLOVE) ×2
GLOVE BIOGEL PI INDICATOR 8 (GLOVE) ×2
GLOVE BIOGEL PI INDICATOR 9 (GLOVE) ×2
GLOVE ECLIPSE 6.5 STRL STRAW (GLOVE) ×2 IMPLANT
GOWN STRL REUS W/ TWL LRG LVL3 (GOWN DISPOSABLE) ×1 IMPLANT
GOWN STRL REUS W/ TWL LRG LVL4 (GOWN DISPOSABLE) ×1 IMPLANT
GOWN STRL REUS W/TWL LRG LVL3 (GOWN DISPOSABLE) ×3
GOWN STRL REUS W/TWL LRG LVL4 (GOWN DISPOSABLE) ×3
GOWN STRL REUS W/TWL XL LVL4 (GOWN DISPOSABLE) ×3 IMPLANT
IV NS IRRIG 3000ML ARTHROMATIC (IV SOLUTION) IMPLANT
MANIFOLD NEPTUNE II (INSTRUMENTS) ×3 IMPLANT
NDL SAFETY ECLIPSE 18X1.5 (NEEDLE) ×1 IMPLANT
NDL SUT 6 .5 CRC .975X.05 MAYO (NEEDLE) IMPLANT
NEEDLE HYPO 18GX1.5 SHARP (NEEDLE) ×3
NEEDLE MAYO TAPER (NEEDLE)
NS IRRIG 1000ML POUR BTL (IV SOLUTION) IMPLANT
PACK ARTHROSCOPY DSU (CUSTOM PROCEDURE TRAY) ×3 IMPLANT
PACK BASIN DAY SURGERY FS (CUSTOM PROCEDURE TRAY) ×3 IMPLANT
PASSER SUT SWANSON 36MM LOOP (INSTRUMENTS) IMPLANT
PENCIL BUTTON HOLSTER BLD 10FT (ELECTRODE) IMPLANT
SET IRRIG Y TYPE TUR BLADDER L (SET/KITS/TRAYS/PACK) ×3 IMPLANT
SLEEVE SCD COMPRESS KNEE MED (MISCELLANEOUS) IMPLANT
SLING ARM IMMOBILIZER LRG (SOFTGOODS) IMPLANT
SLING ARM LRG ADULT FOAM STRAP (SOFTGOODS) IMPLANT
SLING ARM MED ADULT FOAM STRAP (SOFTGOODS) IMPLANT
SPONGE LAP 4X18 X RAY DECT (DISPOSABLE) IMPLANT
SUCTION FRAZIER TIP 10 FR DISP (SUCTIONS) IMPLANT
SUT ETHIBOND 2 OS 4 DA (SUTURE) IMPLANT
SUT ETHILON 4 0 PS 2 18 (SUTURE) IMPLANT
SUT MNCRL AB 4-0 PS2 18 (SUTURE) IMPLANT
SUT VIC AB 3-0 PS1 18 (SUTURE)
SUT VIC AB 3-0 PS1 18XBRD (SUTURE) IMPLANT
SYR 5ML LL (SYRINGE) ×3 IMPLANT
SYR TB 1ML LL NO SAFETY (SYRINGE) IMPLANT
TAPE PAPER 3X10 WHT MICROPORE (GAUZE/BANDAGES/DRESSINGS) ×3 IMPLANT
TOWEL OR 17X24 6PK STRL BLUE (TOWEL DISPOSABLE) ×3 IMPLANT
TUBE CONNECTING 20'X1/4 (TUBING)
TUBE CONNECTING 20X1/4 (TUBING) IMPLANT
WAND STAR VAC 90 (SURGICAL WAND) ×3 IMPLANT
WATER STERILE IRR 1000ML POUR (IV SOLUTION) ×3 IMPLANT

## 2013-10-20 NOTE — Discharge Instructions (Addendum)
Surgery for Impingement Syndrome, Subacromial Decompression Subacromial decompression surgery is for patients with rotator cuff tendinitis, subacromial bursitis (inflamed, fluid-filled sac between the shoulder joint and top of the shoulder blade), or impingement syndrome (inflamed rotator cuff tendons due to pinching). Surgery is for patients with continued shoulder pain despite at least 3 months of rehabilitation treatment. The shoulder pain is so severe that it affects patients' daily activities or greatly decreases their quality of life. Patients who will benefit most from surgery are those whose shoulder bone (acromion) has a curve, hook, or bump (spur), or those who have a partial rotator cuff tear. There are 3 purposes of surgery. First, the inflamed bursa is removed. Second, the shoulder bone defect (curve, hook, spur) is removed. Third, the coracoacromial ligament is cut. This surgery is intended to reduce pain by increasing space in the area so that the rotator cuff is less likely to be pinched. REASONS NOT TO OPERATE   Infection of the shoulder joint.  Patient is unable or unwilling to complete the postoperative program. This includes keeping the shoulder in a sling or immobilizer (if open surgery is performed), or performing the needed rehabilitation.  Emotional or psychological conditions that contribute to the shoulder condition.  Patients who have rotator cuff inflammation due to other causes. This includes impingement caused by shoulder instability, weak shoulder blade muscles (scapula), shoulder arthritis, stiff or frozen shoulder, or a large os acromiale (failure of the shoulder bone growth plates to fuse properly). RISKS AND COMPLICATIONS   Infection.  Bleeding.  Injury to nerves (numbness, weakness, paralysis).  Continued or recurring pain.  Detachment of the deltoid shoulder muscle (if open surgery is performed).  Stiffness or loss of shoulder motion.  Decrease in  athletic performance.  Shoulder weakness.  Fracture of the shoulder bone.  Pain in the joint connecting the shoulder bone and collarbone.  Removal of too much or too little shoulder bone. TECHNIQUE Technique used may vary between surgeons. In general, the surgery is performed with a flexible tube and tools inserted in a few small slits near the joint (arthroscopic). It may also be completed through an open cut (incision). The goal of the procedure is to remove the bursa, remove the shoulder bone deformity, and cut the coracoacromial ligament. Electricity will be used to sear the small capillaries (cauterize) to stop small amounts of bleeding. Other tools used are an electric or motorized shaver to remove the bursa, and a small power drill (burr) to remove the deformity of the shoulder bone.  If the procedure is completed with an open incision, the surgeon will detach the deltoid shoulder muscle from the shoulder bone and cut the coracoacromial ligament. The deformity of the shoulder bone is then removed, using a saw or chisel (osteotome). A file (rasp) may be used to smooth the edges. Finally, the bursa is removed with scissors, and the deltoid muscle is reattached to the shoulder bone.  HOME CARE INSTRUCTIONS   Postoperative care depends on the surgical technique used (arthroscopic or open).  Follow the instructions given to you by your surgeon.  Keep the wound clean and dry for 10 to 14 days after surgery, especially if open surgery is performed.  Wear a sling, brace, or immobilizer as prescribed by your surgeon. This often lasts a couple days for arthroscopic procedures, or 6 to 8 weeks for open procedures, because the deltoid muscle must heal.  You will be given pain medicines by your caregiver or surgeon. Take only as much medicine as  you need.  You may be advised to perform motion exercises immediately after surgery. These may be performed at home or with a therapist.  Postoperative  rehabilitation and exercises are very important to regain motion, and later, strength. RETURN TO SPORTS   6 weeks is the minimum waiting time required before returning to play. Open procedure surgeries are often longer.  Return to sports depends on the type of sport and the position played.  A therapist must assess your strength and range of motion before athletics may be resumed. SEEK MEDICAL CARE IF:   You experience pain, numbness, or coldness in the hand.  Blue, gray, or dark color appears in the fingernails.  Any of the following occur after surgery:  Increased pain, swelling, redness, drainage of fluids, or bleeding in the affected area.  Signs of infection (headache, muscle aches, dizziness, a general ill feeling with fever).  New, unexplained symptoms develop. (Drugs used in treatment may produce side effects.) Do not eat or drink anything before surgery. Solid food makes general anesthesia more hazardous.  Document Released: 01/02/2005 Document Revised: 05/19/2013 Document Reviewed: 04/16/2008 [facility] Patient Information 2015 [place], [place]. This information is not intended to replace advice given to you by your health care provider. Make sure you discuss any questions you have with your health care provider.    Post Anesthesia Home Care Instructions  Activity: Get plenty of rest for the remainder of the day. A responsible adult should stay with you for 24 hours following the procedure.  For the next 24 hours, DO NOT: -Drive a car -Paediatric nurse -Drink alcoholic beverages -Take any medication unless instructed by your physician -Make any legal decisions or sign important papers.  Meals: Start with liquid foods such as gelatin or soup. Progress to regular foods as tolerated. Avoid greasy, spicy, heavy foods. If nausea and/or vomiting occur, drink only clear liquids until the nausea and/or vomiting subsides. Call your physician if vomiting continues.  Special  Instructions/Symptoms: Your throat may feel dry or sore from the anesthesia or the breathing tube placed in your throat during surgery. If this causes discomfort, gargle with warm salt water. The discomfort should disappear within 24 hours.

## 2013-10-20 NOTE — Anesthesia Procedure Notes (Signed)
Procedure Name: Intubation Date/Time: 10/20/2013 11:18 AM Performed by: Warren Kugelman Pre-anesthesia Checklist: Patient identified, Emergency Drugs available, Suction available and Patient being monitored Patient Re-evaluated:Patient Re-evaluated prior to inductionOxygen Delivery Method: Circle System Utilized Preoxygenation: Pre-oxygenation with 100% oxygen Intubation Type: IV induction Ventilation: Mask ventilation without difficulty Laryngoscope Size: Miller and 1 Grade View: Grade I Tube type: Oral Tube size: 7.0 mm Number of attempts: 1 Airway Equipment and Method: stylet and oral airway Placement Confirmation: ETT inserted through vocal cords under direct vision,  positive ETCO2 and breath sounds checked- equal and bilateral Secured at: 22 cm Tube secured with: Tape Dental Injury: Teeth and Oropharynx as per pre-operative assessment

## 2013-10-20 NOTE — Transfer of Care (Signed)
Immediate Anesthesia Transfer of Care Note  Patient: Arthur Daniel  Procedure(s) Performed: Procedure(s): LEFT SHOULDER SCOPE/DISTAL ACROMIOPLASTY, LABRAL DEBRIDMENT (Left)  Patient Location: PACU  Anesthesia Type:General  Level of Consciousness: awake, alert , oriented and patient cooperative  Airway & Oxygen Therapy: Patient Spontanous Breathing and Patient connected to face mask oxygen  Post-op Assessment: Report given to PACU RN and Post -op Vital signs reviewed and stable  Post vital signs: Reviewed and stable  Complications: No apparent anesthesia complications

## 2013-10-20 NOTE — Interval H&P Note (Signed)
History and Physical Interval Note:  10/20/2013 11:07 AM  Arthur Daniel  has presented today for surgery, with the diagnosis of LEFT SHOULDER AC JOINT ARTHRITIS IMPINGEMENT  The various methods of treatment have been discussed with the patient and family. After consideration of risks, benefits and other options for treatment, the patient has consented to  Procedure(s): LEFT SHOULDER SCOPE/DISTAL ACROMIOPLASTY//DECOMPRESSION WITH DEBRIDEMENT (Left) as a surgical intervention .  The patient's history has been reviewed, patient examined, no change in status, stable for surgery.  I have reviewed the patient's chart and labs.  Questions were answered to the patient's satisfaction.     [name]

## 2013-10-20 NOTE — Anesthesia Preprocedure Evaluation (Addendum)
Anesthesia Evaluation  Patient identified by MRN, date of birth, ID band Patient awake    Reviewed: Allergy & Precautions, H&P , NPO status , Patient's Chart, lab work & pertinent test results, reviewed documented beta blocker date and time   Airway Mallampati: II TM Distance: >3 FB Neck ROM: Full    Dental no notable dental hx. (+) Upper Dentures, Dental Advisory Given   Pulmonary sleep apnea and Continuous Positive Airway Pressure Ventilation , former smoker,  breath sounds clear to auscultation  Pulmonary exam normal       Cardiovascular hypertension, Pt. on medications and Pt. on home beta blockers Atrial Fibrillation + pacemaker Rhythm:Regular Rate:Normal     Neuro/Psych negative neurological ROS  negative psych ROS   GI/Hepatic negative GI ROS, Neg liver ROS,   Endo/Other  Hypothyroidism   Renal/GU negative Renal ROS  negative genitourinary   Musculoskeletal   Abdominal   Peds  Hematology negative hematology ROS (+)   Anesthesia Other Findings   Reproductive/Obstetrics negative OB ROS                          Anesthesia Physical Anesthesia Plan  ASA: III  Anesthesia Plan: General   Post-op Pain Management:    Induction: Intravenous  Airway Management Planned: Oral ETT  Additional Equipment:   Intra-op Plan:   Post-operative Plan: Extubation in OR  Informed Consent: I have reviewed the patients History and Physical, chart, labs and discussed the procedure including the risks, benefits and alternatives for the proposed anesthesia with the patient or authorized representative who has indicated his/her understanding and acceptance.   Dental advisory given  Plan Discussed with: CRNA  Anesthesia Plan Comments: (Pt. Declines nerve block.)       Anesthesia Quick Evaluation

## 2013-10-20 NOTE — Anesthesia Postprocedure Evaluation (Signed)
  Anesthesia Post-op Note  Patient: Arthur Daniel  Procedure(s) Performed: Procedure(s): LEFT SHOULDER SCOPE/DISTAL ACROMIOPLASTY, LABRAL DEBRIDMENT (Left)  Patient Location: PACU  Anesthesia Type:General  Level of Consciousness: awake and alert   Airway and Oxygen Therapy: Patient Spontanous Breathing  Post-op Pain: mild  Post-op Assessment: Post-op Vital signs reviewed, Patient's Cardiovascular Status Stable and Respiratory Function Stable  Post-op Vital Signs: Reviewed  Filed Vitals:   10/20/13 1302  BP:   Pulse: 72  Temp:   Resp: 16    Complications: No apparent anesthesia complications

## 2013-10-20 NOTE — Op Note (Signed)
Preoperative diagnosis: L shoulder impingement syndrome, a.c. joint arthritis, possible labral tear   Postoperative diagnosis: Same   Procedure: L shoulder arthroscopic anterior-inferior acromioplasty, formal distal clavicle excision, debride LT  Surgeon: Kathalene Frames. Mayer Camel M.D.   First assistant: Leighton Parody PA-C, was present for entire procedure, was needed for retraction, operation of arthroscopic equipment, placement of dressing.  Anesthetic: Local,  general endotracheal   Estimated blood loss: Minimal   Fluid replacement: 1200 cc of crystalloid.   Indications for procedure: Patient with shoulder impingement syndrome and a.c. joint arthritis who is failed conservative measures with anti-inflammatory medicines, therapy and exercises, but did get temporary relief from a cortisone injection in the subacromial space. Because of increasing pain and weakness patient desires elective arthroscopic evaluation with acromioplasty, distal clavicle excision and we will also address any other intra-articular pathology. Risks and benefits of surgery were discussed prior to the procedure and all questions answered.   Description of procedure: Patient was identified by arm band and given preoperative IV antibiotics in the holding area, as well as interscalene block anesthetic.Marland Kitchen Patient was taken to the operating room where the appropriate anesthetic monitors were attached and general endotracheal anesthesia was induced with the patient in the supine position. Patient was then placed in the beachchair position and the L upper extremity prepped and draped in the usual sterile fashion from the wrist to the hemithorax. A time out procedure was performed. We began the operation by making standard portals 1.5 cm anterior to the acromion, 1.5 cm lateral to the junction of the middle and posterior thirds of the acromion, and 1.5 cm posterior the posterior lateral corner of the acromion process. The inflow with gravity  was placed anteriorly, the arthroscope laterally, and a 4.2 great-white sucker shaver posteriorly. The subacromial bursa was resected and we visualized the subacromial spur which was then removed with a 4.5 hooded vortex bur making 2 passes. We then visualized the arthritic a.c. joint and the inferior distal centimeter of the clavicle was resected using a 4.5 hooded vortex bur from the posterior portal. We then brought the burr anteriorly, the scope posteriorly, and the inflow laterally completing the 1 cm distal clavicle excision. Moving into the glenohumeral joint the articular and the labral cartilages were visualized and we identified degenerative tearing of the labrum and some partial tearing of the biceps tendon as it approached the bicipital groove. This was debrided out with a 3.5 mm Gator sucker shaver.. At this point the shoulder was irrigated out normal saline solution the arthroscopic instruments removed and a dressing of Xerofoam 4 x 4 dressing sponges, paper tape, and sling applied the patient was then placed in supine awakened extubated and taken to the recovery without difficulty.

## 2013-10-21 ENCOUNTER — Encounter (HOSPITAL_BASED_OUTPATIENT_CLINIC_OR_DEPARTMENT_OTHER): Payer: Self-pay | Admitting: Orthopedic Surgery

## 2013-10-31 ENCOUNTER — Encounter: Payer: Self-pay | Admitting: Cardiology

## 2013-12-26 DIAGNOSIS — G4733 Obstructive sleep apnea (adult) (pediatric): Secondary | ICD-10-CM | POA: Diagnosis not present

## 2014-01-20 ENCOUNTER — Ambulatory Visit (INDEPENDENT_AMBULATORY_CARE_PROVIDER_SITE_OTHER): Payer: BC Managed Care – PPO | Admitting: *Deleted

## 2014-01-20 DIAGNOSIS — I480 Paroxysmal atrial fibrillation: Secondary | ICD-10-CM | POA: Diagnosis not present

## 2014-01-20 NOTE — Progress Notes (Signed)
Remote pacemaker transmission.   

## 2014-01-21 LAB — MDC_IDC_ENUM_SESS_TYPE_REMOTE
Battery Remaining Longevity: 82 mo
Battery Remaining Percentage: 67 %
Battery Voltage: 2.93 V
Brady Statistic AP VP Percent: 1 %
Brady Statistic AS VS Percent: 50 %
Brady Statistic RA Percent Paced: 49 %
Brady Statistic RV Percent Paced: 1 %
Date Time Interrogation Session: 20160105084609
Implantable Pulse Generator Serial Number: 7079515
Lead Channel Impedance Value: 380 Ohm
Lead Channel Impedance Value: 430 Ohm
Lead Channel Pacing Threshold Amplitude: 1 V
Lead Channel Pacing Threshold Amplitude: 1 V
Lead Channel Sensing Intrinsic Amplitude: 3.4 mV
Lead Channel Sensing Intrinsic Amplitude: 8.2 mV
Lead Channel Setting Pacing Amplitude: 2 V
Lead Channel Setting Pacing Amplitude: 2.5 V
Lead Channel Setting Sensing Sensitivity: 2 mV
MDC IDC MSMT LEADCHNL RA PACING THRESHOLD PULSEWIDTH: 0.4 ms
MDC IDC MSMT LEADCHNL RV PACING THRESHOLD PULSEWIDTH: 0.4 ms
MDC IDC SET LEADCHNL RV PACING PULSEWIDTH: 0.4 ms
MDC IDC STAT BRADY AP VS PERCENT: 50 %
MDC IDC STAT BRADY AS VP PERCENT: 1 %

## 2014-02-02 ENCOUNTER — Encounter: Payer: Self-pay | Admitting: Cardiology

## 2014-02-16 ENCOUNTER — Encounter: Payer: Self-pay | Admitting: Internal Medicine

## 2014-02-17 DIAGNOSIS — J069 Acute upper respiratory infection, unspecified: Secondary | ICD-10-CM | POA: Diagnosis not present

## 2014-02-17 DIAGNOSIS — J309 Allergic rhinitis, unspecified: Secondary | ICD-10-CM | POA: Diagnosis not present

## 2014-02-17 DIAGNOSIS — Z1389 Encounter for screening for other disorder: Secondary | ICD-10-CM | POA: Diagnosis not present

## 2014-03-17 DIAGNOSIS — Z9889 Other specified postprocedural states: Secondary | ICD-10-CM | POA: Diagnosis not present

## 2014-03-17 DIAGNOSIS — M25512 Pain in left shoulder: Secondary | ICD-10-CM | POA: Diagnosis not present

## 2014-04-07 DIAGNOSIS — M5412 Radiculopathy, cervical region: Secondary | ICD-10-CM | POA: Diagnosis not present

## 2014-04-07 DIAGNOSIS — M546 Pain in thoracic spine: Secondary | ICD-10-CM | POA: Diagnosis not present

## 2014-04-08 ENCOUNTER — Encounter: Payer: Self-pay | Admitting: Internal Medicine

## 2014-04-08 ENCOUNTER — Ambulatory Visit (INDEPENDENT_AMBULATORY_CARE_PROVIDER_SITE_OTHER): Payer: BLUE CROSS/BLUE SHIELD | Admitting: Internal Medicine

## 2014-04-08 VITALS — BP 128/88 | HR 60 | Ht 73.0 in | Wt 286.0 lb

## 2014-04-08 DIAGNOSIS — Z950 Presence of cardiac pacemaker: Secondary | ICD-10-CM | POA: Diagnosis not present

## 2014-04-08 DIAGNOSIS — I495 Sick sinus syndrome: Secondary | ICD-10-CM

## 2014-04-08 DIAGNOSIS — Z45018 Encounter for adjustment and management of other part of cardiac pacemaker: Secondary | ICD-10-CM

## 2014-04-08 LAB — MDC_IDC_ENUM_SESS_TYPE_INCLINIC
Brady Statistic RA Percent Paced: 50 %
Brady Statistic RV Percent Paced: 0.29 %
Date Time Interrogation Session: 20160323103434
Lead Channel Impedance Value: 375 Ohm
Lead Channel Impedance Value: 425 Ohm
Lead Channel Pacing Threshold Amplitude: 0.875 V
Lead Channel Pacing Threshold Amplitude: 1.25 V
Lead Channel Pacing Threshold Pulse Width: 0.4 ms
Lead Channel Pacing Threshold Pulse Width: 0.4 ms
Lead Channel Setting Pacing Amplitude: 1.875
Lead Channel Setting Pacing Amplitude: 2.5 V
Lead Channel Setting Sensing Sensitivity: 2 mV
MDC IDC MSMT BATTERY REMAINING LONGEVITY: 108 mo
MDC IDC MSMT BATTERY VOLTAGE: 2.93 V
MDC IDC MSMT LEADCHNL RA PACING THRESHOLD AMPLITUDE: 1 V
MDC IDC MSMT LEADCHNL RA PACING THRESHOLD PULSEWIDTH: 0.4 ms
MDC IDC MSMT LEADCHNL RA SENSING INTR AMPL: 3.5 mV
MDC IDC MSMT LEADCHNL RV PACING THRESHOLD AMPLITUDE: 1.25 V
MDC IDC MSMT LEADCHNL RV PACING THRESHOLD PULSEWIDTH: 0.4 ms
MDC IDC MSMT LEADCHNL RV SENSING INTR AMPL: 9.7 mV
MDC IDC PG SERIAL: 7079515
MDC IDC SET LEADCHNL RV PACING PULSEWIDTH: 0.4 ms

## 2014-04-08 MED ORDER — NEBIVOLOL HCL 10 MG PO TABS: 5.0000 mg | ORAL_TABLET | Freq: Every day | ORAL | Status: DC

## 2014-04-08 MED ORDER — LISINOPRIL 5 MG PO TABS: 2.5000 mg | ORAL_TABLET | Freq: Every day | ORAL | Status: DC

## 2014-04-08 NOTE — Progress Notes (Signed)
Patient Care Team: Corine Shelter, PA-C as PCP - General (Physician Assistant)   HPI  Arthur Daniel is a 73 y.o. male Seen in follow-up for pacemaker implantation for symptomatic bradycardia associated with sinus node dysfunction.  It is some years I have seen him. He has been seen by Dr. Saundra Shelling. He is treated currently with aspirin.  He has a history of atrial fibrillation with a CHADS-VASc score is 2 for age and hypertension  however, his atrial fibrillation occurred in the context of hyperthyroidism/Graves' disease which is now quiescent.  He notes in recent months his grandson died from a hanging, his wife has been diagnosed with tumors or infections in her liver and the workup is proceeding.  Echocardiogram 2010 demonstrated normal LV function  ECG from 2015 was reviewed  Past Medical History  Diagnosis Date  . Hypercholesterolemia   . Mixed hyperlipidemia   . Lump or mass in breast   . Essential hypertension, benign   . Allergic rhinitis, cause unspecified   . Calculus of kidney   . Atrial fibrillation   . Hematuria, unspecified   . ED (erectile dysfunction)   . Postablative hypothyroidism   . Diverticulosis   . Ureteral stone     Dr. Risa Grill  . Obesity   . Pacemaker   . Sleep apnea     Use CPAP machine  . Hyperthyroidism     Past Surgical History  Procedure Laterality Date  . Tonsillectomy and adenoidectomy    . Anterior cruciate ligament repair Right 1994  . Other surgical history  1995    Ear surgery due to mastoiditis  . Meniscus repair Left 2004  . Shoulder surgery Right 11/2002  . Foot surgery Left 2005    Fot repair after chainsaw accident  . Pacemaker insertion  11/26/08    [name]  . Shoulder surgery Right 2011  . Cataract extraction, bilateral  08/2010  . Flexible sigmoidoscopy  2001  . Colonoscopy  2001, 2008  . Thyroglossal duct cyst    . Shoulder arthroscopy with subacromial decompression Left 10/20/2013    Procedure: LEFT SHOULDER  SCOPE/DISTAL ACROMIOPLASTY, LABRAL DEBRIDMENT;  Surgeon: [name], MD;  Location: [place];  Service: Orthopedics;  Laterality: Left;    Current Outpatient Prescriptions  Medication Sig Dispense Refill  . ALPRAZolam (XANAX) 1 MG tablet Take 0.5 mg by mouth every 6 (six) hours as needed for anxiety.    Marland Kitchen aspirin 325 MG tablet Take 325 mg by mouth daily.    Marland Kitchen levothyroxine (SYNTHROID, LEVOTHROID) 175 MCG tablet Take 175 mcg by mouth daily before breakfast.     . lisinopril (PRINIVIL,ZESTRIL) 2.5 MG tablet Take 1 tablet (2.5 mg total) by mouth daily. (Patient taking differently: Take 2.5 mg by mouth daily. Taking 1/2 pil daily) 90 tablet 3  . nebivolol (BYSTOLIC) 10 MG tablet Take 5 mg by mouth daily.    . pravastatin (PRAVACHOL) 40 MG tablet Take 40 mg by mouth daily.     No current facility-administered medications for this visit.    Allergies  Allergen Reactions  . Lipitor [Atorvastatin] Other (See Comments)    Breast lumps    Review of Systems negative except from HPI and PMH  Physical Exam BP 128/88 mmHg  Pulse 60  Ht 6\' 1"  (1.854 m)  Wt 286 lb (129.729 kg)  BMI 37.74 kg/m2 Well developed and  Morbidly obese  in no acute distress HENT normal E scleral and icterus clear Neck Supple  JVP flat; carotids brisk and full Clear to ausculation Device pocket well healed; without hematoma or erythema.  There is no tethering  Regular rate and rhythm, no murmurs gallops or rub Soft with active bowel sounds No clubbing cyanosis Trace Edema Alert and oriented, grossly normal motor and sensory function Skin Warm and Dry  ECG was ordered today demonstrated atrial paced rhythm at 60 Intervals 21/09/42 Unchanged from 2015  Assessment and  Plan  Sinus node dysfunction stable post pacing  pacemaker-St. Jude The patient's device was interrogated.  The information was reviewed. No changes were made in the programming.    Atrial  fibrillation  Hypertension  Abnormal ECG  Unchanged from previously  No recurrent atrial fibrillaton  BP well controlled

## 2014-04-08 NOTE — Patient Instructions (Signed)
Your physician has recommended you make the following change in your medication:  1) DECREASE Aspirin to 81 mg daily  Remote monitoring is used to monitor your Pacemaker of ICD from home. This monitoring reduces the number of office visits required to check your device to one time per year. It allows [place] to keep an eye on the functioning of your device to ensure it is working properly. You are scheduled for a device check from home on 07/08/14. You may send your transmission at any time that day. If you have a wireless device, the transmission will be sent automatically. After your physician reviews your transmission, you will receive a postcard with your next transmission date.  Your physician wants you to follow-up in: 1 year with Dr. [name].  You will receive a reminder letter in the mail two months in advance. If you don't receive a letter, please call our office to schedule the follow-up appointment.

## 2014-04-13 DIAGNOSIS — M25512 Pain in left shoulder: Secondary | ICD-10-CM | POA: Diagnosis not present

## 2014-04-13 DIAGNOSIS — M546 Pain in thoracic spine: Secondary | ICD-10-CM | POA: Diagnosis not present

## 2014-04-13 DIAGNOSIS — M542 Cervicalgia: Secondary | ICD-10-CM | POA: Diagnosis not present

## 2014-04-15 DIAGNOSIS — M546 Pain in thoracic spine: Secondary | ICD-10-CM | POA: Diagnosis not present

## 2014-04-15 DIAGNOSIS — M542 Cervicalgia: Secondary | ICD-10-CM | POA: Diagnosis not present

## 2014-04-15 DIAGNOSIS — M25512 Pain in left shoulder: Secondary | ICD-10-CM | POA: Diagnosis not present

## 2014-04-20 DIAGNOSIS — M25512 Pain in left shoulder: Secondary | ICD-10-CM | POA: Diagnosis not present

## 2014-04-20 DIAGNOSIS — M542 Cervicalgia: Secondary | ICD-10-CM | POA: Diagnosis not present

## 2014-04-20 DIAGNOSIS — M546 Pain in thoracic spine: Secondary | ICD-10-CM | POA: Diagnosis not present

## 2014-04-22 DIAGNOSIS — M546 Pain in thoracic spine: Secondary | ICD-10-CM | POA: Diagnosis not present

## 2014-04-22 DIAGNOSIS — M542 Cervicalgia: Secondary | ICD-10-CM | POA: Diagnosis not present

## 2014-04-22 DIAGNOSIS — M25512 Pain in left shoulder: Secondary | ICD-10-CM | POA: Diagnosis not present

## 2014-04-27 DIAGNOSIS — M25512 Pain in left shoulder: Secondary | ICD-10-CM | POA: Diagnosis not present

## 2014-04-27 DIAGNOSIS — M546 Pain in thoracic spine: Secondary | ICD-10-CM | POA: Diagnosis not present

## 2014-04-27 DIAGNOSIS — M542 Cervicalgia: Secondary | ICD-10-CM | POA: Diagnosis not present

## 2014-04-29 DIAGNOSIS — M546 Pain in thoracic spine: Secondary | ICD-10-CM | POA: Diagnosis not present

## 2014-04-29 DIAGNOSIS — E785 Hyperlipidemia, unspecified: Secondary | ICD-10-CM | POA: Diagnosis not present

## 2014-04-29 DIAGNOSIS — M25512 Pain in left shoulder: Secondary | ICD-10-CM | POA: Diagnosis not present

## 2014-04-29 DIAGNOSIS — I10 Essential (primary) hypertension: Secondary | ICD-10-CM | POA: Diagnosis not present

## 2014-04-29 DIAGNOSIS — I482 Chronic atrial fibrillation: Secondary | ICD-10-CM | POA: Diagnosis not present

## 2014-04-29 DIAGNOSIS — M542 Cervicalgia: Secondary | ICD-10-CM | POA: Diagnosis not present

## 2014-04-29 DIAGNOSIS — E890 Postprocedural hypothyroidism: Secondary | ICD-10-CM | POA: Diagnosis not present

## 2014-05-04 DIAGNOSIS — M542 Cervicalgia: Secondary | ICD-10-CM | POA: Diagnosis not present

## 2014-05-04 DIAGNOSIS — M25512 Pain in left shoulder: Secondary | ICD-10-CM | POA: Diagnosis not present

## 2014-05-04 DIAGNOSIS — M546 Pain in thoracic spine: Secondary | ICD-10-CM | POA: Diagnosis not present

## 2014-05-07 DIAGNOSIS — M25512 Pain in left shoulder: Secondary | ICD-10-CM | POA: Diagnosis not present

## 2014-05-07 DIAGNOSIS — M542 Cervicalgia: Secondary | ICD-10-CM | POA: Diagnosis not present

## 2014-05-07 DIAGNOSIS — M546 Pain in thoracic spine: Secondary | ICD-10-CM | POA: Diagnosis not present

## 2014-05-25 DIAGNOSIS — Q318 Other congenital malformations of larynx: Secondary | ICD-10-CM | POA: Diagnosis not present

## 2014-05-25 DIAGNOSIS — R1314 Dysphagia, pharyngoesophageal phase: Secondary | ICD-10-CM | POA: Diagnosis not present

## 2014-05-25 DIAGNOSIS — F17210 Nicotine dependence, cigarettes, uncomplicated: Secondary | ICD-10-CM | POA: Diagnosis not present

## 2014-07-08 ENCOUNTER — Ambulatory Visit (INDEPENDENT_AMBULATORY_CARE_PROVIDER_SITE_OTHER): Payer: BLUE CROSS/BLUE SHIELD | Admitting: *Deleted

## 2014-07-08 DIAGNOSIS — I495 Sick sinus syndrome: Secondary | ICD-10-CM

## 2014-07-08 NOTE — Progress Notes (Signed)
Remote pacemaker transmission.   

## 2014-07-12 LAB — CUP PACEART REMOTE DEVICE CHECK
Battery Remaining Longevity: 52 mo
Battery Remaining Percentage: 73 %
Brady Statistic AP VP Percent: 1 %
Brady Statistic AP VS Percent: 47 %
Brady Statistic AS VS Percent: 52 %
Brady Statistic RA Percent Paced: 46 %
Lead Channel Impedance Value: 360 Ohm
Lead Channel Pacing Threshold Amplitude: 1.25 V
Lead Channel Pacing Threshold Pulse Width: 0.4 ms
Lead Channel Pacing Threshold Pulse Width: 0.5 ms
Lead Channel Sensing Intrinsic Amplitude: 3.7 mV
Lead Channel Sensing Intrinsic Amplitude: 7.5 mV
Lead Channel Setting Pacing Amplitude: 2.5 V
Lead Channel Setting Pacing Amplitude: 5 V
Lead Channel Setting Sensing Sensitivity: 2 mV
MDC IDC MSMT BATTERY VOLTAGE: 2.92 V
MDC IDC MSMT LEADCHNL RA PACING THRESHOLD AMPLITUDE: 0.875 V
MDC IDC MSMT LEADCHNL RV IMPEDANCE VALUE: 430 Ohm
MDC IDC SESS DTM: 20160622075020
MDC IDC SET LEADCHNL RV PACING PULSEWIDTH: 0.4 ms
MDC IDC STAT BRADY AS VP PERCENT: 1 %
MDC IDC STAT BRADY RV PERCENT PACED: 1 %
Pulse Gen Serial Number: 7079515

## 2014-07-21 ENCOUNTER — Encounter: Payer: Self-pay | Admitting: Cardiology

## 2014-07-23 DIAGNOSIS — Z08 Encounter for follow-up examination after completed treatment for malignant neoplasm: Secondary | ICD-10-CM | POA: Diagnosis not present

## 2014-07-23 DIAGNOSIS — L821 Other seborrheic keratosis: Secondary | ICD-10-CM | POA: Diagnosis not present

## 2014-07-23 DIAGNOSIS — Z85828 Personal history of other malignant neoplasm of skin: Secondary | ICD-10-CM | POA: Diagnosis not present

## 2014-07-24 ENCOUNTER — Encounter: Payer: Self-pay | Admitting: Internal Medicine

## 2014-08-31 DIAGNOSIS — M79674 Pain in right toe(s): Secondary | ICD-10-CM | POA: Diagnosis not present

## 2014-09-10 DIAGNOSIS — G4733 Obstructive sleep apnea (adult) (pediatric): Secondary | ICD-10-CM | POA: Diagnosis not present

## 2014-10-12 ENCOUNTER — Ambulatory Visit (INDEPENDENT_AMBULATORY_CARE_PROVIDER_SITE_OTHER): Payer: BLUE CROSS/BLUE SHIELD | Admitting: *Deleted

## 2014-10-12 DIAGNOSIS — I495 Sick sinus syndrome: Secondary | ICD-10-CM

## 2014-10-13 LAB — CUP PACEART REMOTE DEVICE CHECK
Battery Remaining Percentage: 57 %
Battery Voltage: 2.89 V
Brady Statistic AP VP Percent: 1 %
Brady Statistic AP VS Percent: 47 %
Brady Statistic RV Percent Paced: 1 %
Lead Channel Impedance Value: 430 Ohm
Lead Channel Pacing Threshold Amplitude: 1.25 V
Lead Channel Sensing Intrinsic Amplitude: 3.7 mV
Lead Channel Setting Pacing Amplitude: 5 V
Lead Channel Setting Pacing Pulse Width: 0.4 ms
Lead Channel Setting Sensing Sensitivity: 2 mV
MDC IDC MSMT BATTERY REMAINING LONGEVITY: 41 mo
MDC IDC MSMT LEADCHNL RA IMPEDANCE VALUE: 380 Ohm
MDC IDC MSMT LEADCHNL RA PACING THRESHOLD AMPLITUDE: 0.875 V
MDC IDC MSMT LEADCHNL RA PACING THRESHOLD PULSEWIDTH: 0.5 ms
MDC IDC MSMT LEADCHNL RV PACING THRESHOLD PULSEWIDTH: 0.4 ms
MDC IDC MSMT LEADCHNL RV SENSING INTR AMPL: 9 mV
MDC IDC PG SERIAL: 7079515
MDC IDC SESS DTM: 20160926062322
MDC IDC SET LEADCHNL RV PACING AMPLITUDE: 2.5 V
MDC IDC STAT BRADY AS VP PERCENT: 1 %
MDC IDC STAT BRADY AS VS PERCENT: 52 %
MDC IDC STAT BRADY RA PERCENT PACED: 47 %

## 2014-10-13 NOTE — Progress Notes (Signed)
Remote pacemaker transmission.   

## 2014-10-16 ENCOUNTER — Encounter: Payer: Self-pay | Admitting: Cardiology

## 2014-10-21 DIAGNOSIS — M436 Torticollis: Secondary | ICD-10-CM | POA: Diagnosis not present

## 2014-10-21 DIAGNOSIS — Z23 Encounter for immunization: Secondary | ICD-10-CM | POA: Diagnosis not present

## 2014-10-21 DIAGNOSIS — I10 Essential (primary) hypertension: Secondary | ICD-10-CM | POA: Diagnosis not present

## 2014-11-03 ENCOUNTER — Encounter: Payer: Self-pay | Admitting: Internal Medicine

## 2014-12-01 DIAGNOSIS — Z720 Tobacco use: Secondary | ICD-10-CM | POA: Diagnosis not present

## 2014-12-01 DIAGNOSIS — H7011 Chronic mastoiditis, right ear: Secondary | ICD-10-CM | POA: Diagnosis not present

## 2015-01-12 ENCOUNTER — Ambulatory Visit (INDEPENDENT_AMBULATORY_CARE_PROVIDER_SITE_OTHER): Payer: BLUE CROSS/BLUE SHIELD | Admitting: *Deleted

## 2015-01-12 DIAGNOSIS — I495 Sick sinus syndrome: Secondary | ICD-10-CM | POA: Diagnosis not present

## 2015-01-12 NOTE — Progress Notes (Signed)
Remote pacemaker transmission.   

## 2015-01-29 LAB — CUP PACEART INCLINIC DEVICE CHECK
Brady Statistic RA Percent Paced: 52 %
Brady Statistic RV Percent Paced: 1 %
Date Time Interrogation Session: 20170113092328
Implantable Lead Implant Date: 20101111
Implantable Lead Location: 753860
Lead Channel Impedance Value: 430 Ohm
Lead Channel Sensing Intrinsic Amplitude: 3.6 mV
Lead Channel Sensing Intrinsic Amplitude: 9 mV
MDC IDC LEAD IMPLANT DT: 20101111
MDC IDC LEAD LOCATION: 753859
MDC IDC MSMT LEADCHNL RA IMPEDANCE VALUE: 380 Ohm
MDC IDC PG SERIAL: 7079515

## 2015-02-03 ENCOUNTER — Encounter: Payer: Self-pay | Admitting: Cardiology

## 2015-02-11 DIAGNOSIS — S46819A Strain of other muscles, fascia and tendons at shoulder and upper arm level, unspecified arm, initial encounter: Secondary | ICD-10-CM | POA: Diagnosis not present

## 2015-03-02 DIAGNOSIS — M25512 Pain in left shoulder: Secondary | ICD-10-CM | POA: Diagnosis not present

## 2015-03-03 DIAGNOSIS — M542 Cervicalgia: Secondary | ICD-10-CM | POA: Diagnosis not present

## 2015-03-04 DIAGNOSIS — M542 Cervicalgia: Secondary | ICD-10-CM | POA: Diagnosis not present

## 2015-03-09 DIAGNOSIS — M542 Cervicalgia: Secondary | ICD-10-CM | POA: Diagnosis not present

## 2015-03-11 DIAGNOSIS — M542 Cervicalgia: Secondary | ICD-10-CM | POA: Diagnosis not present

## 2015-03-16 DIAGNOSIS — M542 Cervicalgia: Secondary | ICD-10-CM | POA: Diagnosis not present

## 2015-03-17 DIAGNOSIS — M542 Cervicalgia: Secondary | ICD-10-CM | POA: Diagnosis not present

## 2015-03-18 DIAGNOSIS — M542 Cervicalgia: Secondary | ICD-10-CM | POA: Diagnosis not present

## 2015-03-23 DIAGNOSIS — M542 Cervicalgia: Secondary | ICD-10-CM | POA: Diagnosis not present

## 2015-03-24 DIAGNOSIS — M542 Cervicalgia: Secondary | ICD-10-CM | POA: Diagnosis not present

## 2015-03-29 DIAGNOSIS — M542 Cervicalgia: Secondary | ICD-10-CM | POA: Diagnosis not present

## 2015-04-02 ENCOUNTER — Encounter: Payer: Self-pay | Admitting: Internal Medicine

## 2015-04-02 ENCOUNTER — Ambulatory Visit (INDEPENDENT_AMBULATORY_CARE_PROVIDER_SITE_OTHER): Payer: BLUE CROSS/BLUE SHIELD | Admitting: Internal Medicine

## 2015-04-02 VITALS — BP 132/78 | HR 60 | Ht 73.0 in | Wt 284.2 lb

## 2015-04-02 DIAGNOSIS — I495 Sick sinus syndrome: Secondary | ICD-10-CM | POA: Diagnosis not present

## 2015-04-02 DIAGNOSIS — Z950 Presence of cardiac pacemaker: Secondary | ICD-10-CM | POA: Diagnosis not present

## 2015-04-02 LAB — CUP PACEART INCLINIC DEVICE CHECK
Brady Statistic RA Percent Paced: 52 %
Brady Statistic RV Percent Paced: 0.27 %
Implantable Lead Implant Date: 20101111
Lead Channel Impedance Value: 362.5 Ohm
Lead Channel Pacing Threshold Amplitude: 1 V
Lead Channel Pacing Threshold Pulse Width: 0.4 ms
Lead Channel Pacing Threshold Pulse Width: 0.4 ms
Lead Channel Pacing Threshold Pulse Width: 0.4 ms
Lead Channel Sensing Intrinsic Amplitude: 3.8 mV
Lead Channel Sensing Intrinsic Amplitude: 9 mV
Lead Channel Setting Pacing Amplitude: 2.5 V
MDC IDC LEAD IMPLANT DT: 20101111
MDC IDC LEAD LOCATION: 753859
MDC IDC LEAD LOCATION: 753860
MDC IDC MSMT BATTERY REMAINING LONGEVITY: 45.6
MDC IDC MSMT BATTERY VOLTAGE: 2.89 V
MDC IDC MSMT LEADCHNL RA PACING THRESHOLD AMPLITUDE: 1 V
MDC IDC MSMT LEADCHNL RA PACING THRESHOLD PULSEWIDTH: 0.4 ms
MDC IDC MSMT LEADCHNL RV IMPEDANCE VALUE: 412.5 Ohm
MDC IDC MSMT LEADCHNL RV PACING THRESHOLD AMPLITUDE: 1 V
MDC IDC MSMT LEADCHNL RV PACING THRESHOLD AMPLITUDE: 1 V
MDC IDC SESS DTM: 20170317130757
MDC IDC SET LEADCHNL RA PACING AMPLITUDE: 2 V
MDC IDC SET LEADCHNL RV PACING PULSEWIDTH: 0.4 ms
MDC IDC SET LEADCHNL RV SENSING SENSITIVITY: 2 mV
Pulse Gen Model: 2210
Pulse Gen Serial Number: 7079515

## 2015-04-02 NOTE — Progress Notes (Signed)
Patient Care Team: Corine Shelter, PA-C as PCP - General (Physician Assistant)   HPI  Arthur Daniel is a 75 y.o. male Seen in follow-up for pacemaker implantation for symptomatic bradycardia associated with sinus node dysfunction.  It is some years I have seen him. He has been seen by Dr. Saundra Shelling.   He has a history of atrial fibrillation with a CHADS-VASc score is 2 for age and hypertension  however, his atrial fibrillation occurred in the context of hyperthyroidism/Graves' disease which is now quiescent. He is treated currently with aspirin.  The patient denies chest pain, shortness of breath, nocturnal dyspnea, orthopnea or peripheral edema.  There have been no palpitations, lightheadedness or syncope.  He has treated sleep apnea   His wife is better    Echocardiogram 2010 demonstrated normal LV function  ECG from 2015 was reviewed  Past Medical History  Diagnosis Date  . Hypercholesterolemia   . Mixed hyperlipidemia   . Lump or mass in breast   . Essential hypertension, benign   . Allergic rhinitis, cause unspecified   . Calculus of kidney   . Atrial fibrillation ([place])      Associated with hyperthyroidism/Graves' disease  . Hematuria, unspecified   . ED (erectile dysfunction)   . Postablative hypothyroidism   . Diverticulosis   . Ureteral stone     Dr. Risa Grill  . Obesity   . Pacemaker     [place], [place] 2010  . Sleep apnea     Use CPAP machine  . Hyperthyroidism     Past Surgical History  Procedure Laterality Date  . Tonsillectomy and adenoidectomy    . Anterior cruciate ligament repair Right 1994  . Other surgical history  1995    Ear surgery due to mastoiditis  . Meniscus repair Left 2004  . Shoulder surgery Right 11/2002  . Foot surgery Left 2005    Fot repair after chainsaw accident  . Pacemaker insertion  11/26/08    [name]  . Shoulder surgery Right 2011  . Cataract extraction, bilateral  08/2010  . Flexible sigmoidoscopy  2001  . Colonoscopy   2001, 2008  . Thyroglossal duct cyst    . Shoulder arthroscopy with subacromial decompression Left 10/20/2013    Procedure: LEFT SHOULDER SCOPE/DISTAL ACROMIOPLASTY, LABRAL DEBRIDMENT;  Surgeon: [name], MD;  Location: [place];  Service: Orthopedics;  Laterality: Left;    Current Outpatient Prescriptions  Medication Sig Dispense Refill  . ALPRAZolam (XANAX) 1 MG tablet Take 0.5 mg by mouth every 6 (six) hours as needed for anxiety.    Marland Kitchen aspirin 81 MG tablet Take 81 mg by mouth daily.    Marland Kitchen levothyroxine (SYNTHROID, LEVOTHROID) 175 MCG tablet Take 175 mcg by mouth daily before breakfast.     . lisinopril (PRINIVIL,ZESTRIL) 5 MG tablet Take 0.5 tablets (2.5 mg total) by mouth daily. 15 tablet 11  . nebivolol (BYSTOLIC) 10 MG tablet Take 0.5 tablets (5 mg total) by mouth daily. 15 tablet 11  . pravastatin (PRAVACHOL) 40 MG tablet Take 40 mg by mouth daily.    . tamsulosin (FLOMAX) 0.4 MG CAPS capsule Take 0.4 mg by mouth daily.     No current facility-administered medications for this visit.    Allergies  Allergen Reactions  . Lipitor [Atorvastatin] Other (See Comments)    Breast lumps    Review of Systems negative except from HPI and PMH  Physical Exam BP 132/78 mmHg  Pulse 60  Ht 6'  1" (1.854 m)  Wt 284 lb 3.2 oz (128.912 kg)  BMI 37.50 kg/m2 Well developed and  Morbidly obese  in no acute distress HENT normal E scleral and icterus clear Neck Supple JVP flat; carotids brisk and full Clear to ausculation Device pocket well healed; without hematoma or erythema.  There is no tethering  Regular rate and rhythm, no murmurs gallops or rub Soft with active bowel sounds No clubbing cyanosis Trace Edema Alert and oriented, grossly normal motor and sensory function Skin Warm and Dry  ECG was ordered today demonstrated .apacing 19/09/41  Assessment and  Plan  Sinus node dysfunction stable post pacing    pacemaker-St. Jude The patient's device was  interrogated.  The information was reviewed. No changes were made in the programming.    Atrial fibrillation  Hypertension     No recurrent atrial fibrillaton  BP well controlled

## 2015-04-02 NOTE — Patient Instructions (Signed)
Medication Instructions:  Your physician recommends that you continue on your current medications as directed. Please refer to the Current Medication list given to you today.  Labwork: None ordered  Testing/Procedures: None ordered  Follow-Up: Remote monitoring is used to monitor your Pacemaker of ICD from home. This monitoring reduces the number of office visits required to check your device to one time per year. It allows [place] to keep an eye on the functioning of your device to ensure it is working properly. You are scheduled for a device check from home on 07/05/2015. You may send your transmission at any time that day. If you have a wireless device, the transmission will be sent automatically. After your physician reviews your transmission, you will receive a postcard with your next transmission date.  Your physician wants you to follow-up in: 1 year with Dr. [name].  You will receive a reminder letter in the mail two months in advance. If you don't receive a letter, please call our office to schedule the follow-up appointment.   Any Other Special Instructions Will Be Listed Below (If Applicable).  If you need a refill on your cardiac medications before your next appointment, please call your pharmacy.  Thank you for choosing [facility]!!

## 2015-04-12 DIAGNOSIS — H6983 Other specified disorders of Eustachian tube, bilateral: Secondary | ICD-10-CM | POA: Diagnosis not present

## 2015-04-12 DIAGNOSIS — Z87891 Personal history of nicotine dependence: Secondary | ICD-10-CM | POA: Diagnosis not present

## 2015-04-12 DIAGNOSIS — H6503 Acute serous otitis media, bilateral: Secondary | ICD-10-CM | POA: Diagnosis not present

## 2015-04-12 DIAGNOSIS — H9011 Conductive hearing loss, unilateral, right ear, with unrestricted hearing on the contralateral side: Secondary | ICD-10-CM | POA: Diagnosis not present

## 2015-04-21 DIAGNOSIS — M436 Torticollis: Secondary | ICD-10-CM | POA: Diagnosis not present

## 2015-04-21 DIAGNOSIS — N401 Enlarged prostate with lower urinary tract symptoms: Secondary | ICD-10-CM | POA: Diagnosis not present

## 2015-04-21 DIAGNOSIS — E785 Hyperlipidemia, unspecified: Secondary | ICD-10-CM | POA: Diagnosis not present

## 2015-04-21 DIAGNOSIS — E890 Postprocedural hypothyroidism: Secondary | ICD-10-CM | POA: Diagnosis not present

## 2015-04-21 DIAGNOSIS — I10 Essential (primary) hypertension: Secondary | ICD-10-CM | POA: Diagnosis not present

## 2015-05-03 DIAGNOSIS — H903 Sensorineural hearing loss, bilateral: Secondary | ICD-10-CM | POA: Diagnosis not present

## 2015-05-03 DIAGNOSIS — H7011 Chronic mastoiditis, right ear: Secondary | ICD-10-CM | POA: Diagnosis not present

## 2015-05-03 DIAGNOSIS — H6982 Other specified disorders of Eustachian tube, left ear: Secondary | ICD-10-CM | POA: Diagnosis not present

## 2015-05-03 DIAGNOSIS — Z87891 Personal history of nicotine dependence: Secondary | ICD-10-CM | POA: Diagnosis not present

## 2015-05-13 DIAGNOSIS — D225 Melanocytic nevi of trunk: Secondary | ICD-10-CM | POA: Diagnosis not present

## 2015-05-13 DIAGNOSIS — L820 Inflamed seborrheic keratosis: Secondary | ICD-10-CM | POA: Diagnosis not present

## 2015-05-13 DIAGNOSIS — L918 Other hypertrophic disorders of the skin: Secondary | ICD-10-CM | POA: Diagnosis not present

## 2015-05-19 DIAGNOSIS — H7012 Chronic mastoiditis, left ear: Secondary | ICD-10-CM | POA: Diagnosis not present

## 2015-05-19 DIAGNOSIS — H903 Sensorineural hearing loss, bilateral: Secondary | ICD-10-CM | POA: Diagnosis not present

## 2015-05-19 DIAGNOSIS — K1121 Acute sialoadenitis: Secondary | ICD-10-CM | POA: Diagnosis not present

## 2015-05-19 DIAGNOSIS — Z87891 Personal history of nicotine dependence: Secondary | ICD-10-CM | POA: Diagnosis not present

## 2015-06-25 ENCOUNTER — Emergency Department (HOSPITAL_BASED_OUTPATIENT_CLINIC_OR_DEPARTMENT_OTHER)
Admission: EM | Admit: 2015-06-25 | Discharge: 2015-06-25 | Disposition: A | Discharge status: Home / Self Care | Payer: BLUE CROSS/BLUE SHIELD | Attending: Emergency Medicine | Admitting: Emergency Medicine

## 2015-06-25 ENCOUNTER — Emergency Department (HOSPITAL_BASED_OUTPATIENT_CLINIC_OR_DEPARTMENT_OTHER): Payer: BLUE CROSS/BLUE SHIELD

## 2015-06-25 ENCOUNTER — Encounter (HOSPITAL_BASED_OUTPATIENT_CLINIC_OR_DEPARTMENT_OTHER): Payer: Self-pay | Admitting: Emergency Medicine

## 2015-06-25 DIAGNOSIS — R42 Dizziness and giddiness: Secondary | ICD-10-CM | POA: Diagnosis not present

## 2015-06-25 DIAGNOSIS — E782 Mixed hyperlipidemia: Secondary | ICD-10-CM | POA: Diagnosis not present

## 2015-06-25 DIAGNOSIS — R0789 Other chest pain: Secondary | ICD-10-CM | POA: Insufficient documentation

## 2015-06-25 DIAGNOSIS — Z950 Presence of cardiac pacemaker: Secondary | ICD-10-CM | POA: Insufficient documentation

## 2015-06-25 DIAGNOSIS — Z79899 Other long term (current) drug therapy: Secondary | ICD-10-CM | POA: Insufficient documentation

## 2015-06-25 DIAGNOSIS — R0602 Shortness of breath: Secondary | ICD-10-CM | POA: Diagnosis not present

## 2015-06-25 DIAGNOSIS — E0590 Thyrotoxicosis, unspecified without thyrotoxic crisis or storm: Secondary | ICD-10-CM | POA: Diagnosis not present

## 2015-06-25 DIAGNOSIS — Z6836 Body mass index (BMI) 36.0-36.9, adult: Secondary | ICD-10-CM | POA: Diagnosis not present

## 2015-06-25 DIAGNOSIS — Z7982 Long term (current) use of aspirin: Secondary | ICD-10-CM | POA: Diagnosis not present

## 2015-06-25 DIAGNOSIS — I4891 Unspecified atrial fibrillation: Secondary | ICD-10-CM | POA: Insufficient documentation

## 2015-06-25 DIAGNOSIS — Z87891 Personal history of nicotine dependence: Secondary | ICD-10-CM | POA: Insufficient documentation

## 2015-06-25 DIAGNOSIS — E669 Obesity, unspecified: Secondary | ICD-10-CM | POA: Diagnosis not present

## 2015-06-25 DIAGNOSIS — I10 Essential (primary) hypertension: Secondary | ICD-10-CM | POA: Insufficient documentation

## 2015-06-25 LAB — TROPONIN I: Troponin I: <0.03 ng/mL (ref <0.031)

## 2015-06-25 LAB — CBC
HEMATOCRIT: 46.6 % (ref 39.0–52.0)
Hemoglobin: 15.3 g/dL (ref 13.0–17.0)
MCH: 31.1 pg (ref 26.0–34.0)
MCHC: 32.8 g/dL (ref 30.0–36.0)
MCV: 94.7 fL (ref 78.0–100.0)
Platelets: 140 10*3/uL — ABNORMAL LOW (ref 150–400)
RBC: 4.92 MIL/uL (ref 4.22–5.81)
RDW: 13.2 % (ref 11.5–15.5)
WBC: 7.4 10*3/uL (ref 4.0–10.5)

## 2015-06-25 LAB — BASIC METABOLIC PANEL
Anion gap: 6 (ref 5–15)
BUN: 14 mg/dL (ref 6–20)
CO2: 26 mmol/L (ref 22–32)
CREATININE: 0.93 mg/dL (ref 0.61–1.24)
Calcium: 9 mg/dL (ref 8.9–10.3)
Chloride: 106 mmol/L (ref 101–111)
GFR calc Af Amer: >60 mL/min (ref >60)
Glucose, Bld: 120 mg/dL — ABNORMAL HIGH (ref 65–99)
Potassium: 4.2 mmol/L (ref 3.5–5.1)
SODIUM: 138 mmol/L (ref 135–145)

## 2015-06-25 NOTE — ED Notes (Signed)
MD at bedside. 

## 2015-06-25 NOTE — ED Notes (Signed)
Pt reports chest heaviness this am that radiated to his back, + dizziness, sob ho pacemaker for low heartrate

## 2015-06-25 NOTE — ED Notes (Signed)
Pt c/o SOB and chest pain. Pt reports one episode yesterday that resolved and then started again this morning. Pt reports a non productive cough. Pt reports pain radiates to his back. Pt denies N/V.

## 2015-06-25 NOTE — ED Provider Notes (Signed)
CSN: [id number]     Arrival date & time 06/25/15  1011 History   First MD Initiated Contact with Patient 06/25/15 1035     Chief Complaint  Patient presents with  . Chest Pain     (Consider location/radiation/quality/duration/timing/severity/associated sxs/prior Treatment) HPI  Pt presenting with c/o sharp midsternal chest pain.  Symptoms came on this morning when going to breakfast with friends.  He had sharp pain in center of chest and then became dizzy.  He states the pain lasted for seconds.  He had another similar episode yesterday as well.  No fainting.  No diaphoresis.  No nausea.  He has also had a nonproductive cough associated.  He has a hx of pacemaker which was placed due to bradycardia.  Symptoms are not associated with exertion.  Pt denies raidation of pain to neck or arms.  Per triage note it radiated to back- however he denied this feeling to me.  No leg swelling.  No fever/chills.  He states he has had 2 stress tests that have both been negative- per chart review last nuclear stress was in 2015.  He currently has no symptoms.  There are no other associated systemic symptoms, there are no other alleviating or modifying factors.   Past Medical History  Diagnosis Date  . Hypercholesterolemia   . Mixed hyperlipidemia   . Lump or mass in breast   . Essential hypertension, benign   . Allergic rhinitis, cause unspecified   . Calculus of kidney   . Atrial fibrillation ([place])      Associated with hyperthyroidism/Graves' disease  . Hematuria, unspecified   . ED (erectile dysfunction)   . Postablative hypothyroidism   . Diverticulosis   . Ureteral stone     Dr. Risa Grill  . Obesity   . Pacemaker     [place], [place] 2010  . Sleep apnea     Use CPAP machine  . Hyperthyroidism    Past Surgical History  Procedure Laterality Date  . Tonsillectomy and adenoidectomy    . Anterior cruciate ligament repair Right 1994  . Other surgical history  1995    Ear surgery due to mastoiditis  .  Meniscus repair Left 2004  . Shoulder surgery Right 11/2002  . Foot surgery Left 2005    Fot repair after chainsaw accident  . Pacemaker insertion  11/26/08    [name]  . Shoulder surgery Right 2011  . Cataract extraction, bilateral  08/2010  . Flexible sigmoidoscopy  2001  . Colonoscopy  2001, 2008  . Thyroglossal duct cyst    . Shoulder arthroscopy with subacromial decompression Left 10/20/2013    Procedure: LEFT SHOULDER SCOPE/DISTAL ACROMIOPLASTY, LABRAL DEBRIDMENT;  Surgeon: [name], MD;  Location: [place];  Service: Orthopedics;  Laterality: Left;   Family History  Problem Relation Age of Onset  . CVA Mother   . Hypercholesterolemia Mother   . Goiter Mother   . Diabetes Mother     DM  . Hypertension Mother   . CAD Mother   . Cirrhosis Father     due to alcohol  . Cancer Sister     Breast  . Hypercholesterolemia Brother   . Hypertension Brother   . CAD Brother   . Heart attack Son 72   Social History  Substance Use Topics  . Smoking status: Former Smoker    Types: Cigarettes    Quit date: 01/16/2006  . Smokeless tobacco: None     Comment: 1/2-1 pack a day  .  Alcohol Use: Yes     Comment: rarely drinks wine    Review of Systems  ROS reviewed and all otherwise negative except for mentioned in HPI    Allergies  Lipitor  Home Medications   Prior to Admission medications   Medication Sig Start Date End Date Taking? Authorizing Provider  ALPRAZolam Duanne Moron) 1 MG tablet Take 0.5 mg by mouth daily.    Yes Historical Provider, MD  aspirin 81 MG tablet Take 81 mg by mouth daily.   Yes Historical Provider, MD  levothyroxine (SYNTHROID, LEVOTHROID) 175 MCG tablet Take 175 mcg by mouth daily before breakfast.    Yes Historical Provider, MD  lisinopril (PRINIVIL,ZESTRIL) 5 MG tablet Take 0.5 tablets (2.5 mg total) by mouth daily. 04/08/14  Yes Deboraha Sprang, MD  pravastatin (PRAVACHOL) 40 MG tablet Take 40 mg by mouth daily.   Yes Historical  Provider, MD  tamsulosin (FLOMAX) 0.4 MG CAPS capsule Take 0.4 mg by mouth daily. 03/23/15  Yes Historical Provider, MD  BYSTOLIC 5 MG tablet Take 2.5 mg by mouth daily.  05/29/15   Historical Provider, MD  Flaxseed, Linseed, (FLAXSEED OIL) 1200 MG CAPS Take 1 capsule by mouth daily.    Historical Provider, MD  NON FORMULARY C- PAP MACHINE    Historical Provider, MD   BP 121/76 mmHg  Pulse 62  Temp(Src) 98.6 F (37 C) (Oral)  Resp 18  Ht 6' (1.829 m)  Wt 121.564 kg  BMI 36.34 kg/m2  SpO2 98%  Vitals reviewed  Physical Exam  Physical Examination: General appearance - alert, well appearing, and in no distress Mental status - alert, oriented to person, place, and time Eyes -no conjunctival injection no scleral icterus Mouth - mucous membranes moist, pharynx normal without lesions Chest - clear to auscultation, no wheezes, rales or rhonchi, symmetric air entry Heart - normal rate, regular rhythm, normal S1, S2, no murmurs, rubs, clicks or gallops Abdomen - soft, nontender, nondistended, no masses or organomegaly Neurological - alert, oriented, normal speech Extremities - peripheral pulses normal, no pedal edema, no clubbing or cyanosis Skin - normal coloration and turgor, no rashes  ED Course  Procedures (including critical care time) Labs Review Labs Reviewed  BASIC METABOLIC PANEL - Abnormal; Notable for the following:    Glucose, Bld 120 (*)    All other components within normal limits  CBC - Abnormal; Notable for the following:    Platelets 140 (*)    All other components within normal limits  TROPONIN I  TROPONIN I    Imaging Review No results found. I have personally reviewed and evaluated these images and lab results as part of my medical decision-making.   EKG Interpretation   Date/Time:  Friday June 25 2015 10:31:24 EDT Ventricular Rate:  60 PR Interval:  202 QRS Duration: 96 QT Interval:  437 QTC Calculation: 437 R Axis:   11 Text Interpretation:  Age not  entered, assumed to be  75 years old for  purpose of ECG interpretation Sinus rhythm Borderline prolonged PR  interval Baseline wander in lead(s) V2 V4 No significant change since last  tracing Confirmed by [facility]  MD, [place] [phone]) on 06/26/2015 8:47:23 PM     ED ECG REPORT   Date: 06/25/2015  Rate: 60  Rhythm: normal sinus rhythm  QRS Axis: normal  Intervals: normal  ST/T Wave abnormalities: normal  Conduction Disutrbances: none  Narrative Interpretation: unremarkable, no significant change from prior    MDM   Final diagnoses:  Chest  discomfort    Pt presenting with c/o chest discomfort.  The symptoms were not associated with exertion, and were fleeting.  He states he symptoms came at a time of emotional distress due to an interaction with his neighbors.  He has had no further symptoms in the ED.  2 sets of troponins are negative.  I have discussed results at length with patient, he does not want to pursue admission or other testing at this time.  He has already scheduled a followup appointment in 2 days with his cardiologist.  Discharged with strict return precautions.  Pt agreeable with plan.    Alfonzo Beers, MD 07/02/15 1000

## 2015-06-25 NOTE — Discharge Instructions (Signed)
Return to the ED with any concerns including difficulty breathing, worsening chest pain, fainting, chest pain associated with nausea, sweating, decreased level of alertness/lethargy, or any other alarming symptoms

## 2015-06-27 NOTE — Progress Notes (Signed)
Cardiology Office Note   Date:  06/28/2015   ID:  Arthur Daniel, Arthur Daniel November 24, 1940, MRN [id number]  PCP:  Beatris Si    No chief complaint on file. chest pain   Wt Readings from Last 3 Encounters:  06/28/15 280 lb (127.007 kg)  06/25/15 268 lb (121.564 kg)  04/02/15 284 lb 3.2 oz (128.912 kg)       History of Present Illness: Arthur Daniel is a 75 y.o. male  with AFib and pacer. HE has not felt any significant palpitations. Thyroid has been regulated. He is active around the house. Atrial Fibrillation F/U:  Not exercising. Denies :  Dizziness.  Leg edema.  Orthopnea.  Palpitations.  Shortness of breath.  Syncope.   One episode of chest pain. He was under some emotional stress. He was having an argument with a neighbor regarding what he perceived was the mistreatment of their children. He looks at these children like his own grandkids. He feels that the mother and her boyfriend do not care for them well. Apparently, boyfriend created a confrontation and this is very stressful. Since this episode where he went to the emergency room and had a negative workup, he has been very physically active and not had any problems.    Past Medical History  Diagnosis Date  . Hypercholesterolemia   . Mixed hyperlipidemia   . Lump or mass in breast   . Essential hypertension, benign   . Allergic rhinitis, cause unspecified   . Calculus of kidney   . Atrial fibrillation ([place])      Associated with hyperthyroidism/Graves' disease  . Hematuria, unspecified   . ED (erectile dysfunction)   . Postablative hypothyroidism   . Diverticulosis   . Ureteral stone     Dr. Risa Daniel  . Obesity   . Pacemaker     [place], [place] 2010  . Sleep apnea     Use CPAP machine  . Hyperthyroidism     Past Surgical History  Procedure Laterality Date  . Tonsillectomy and adenoidectomy    . Anterior cruciate ligament repair Right 1994  . Other surgical history  1995    Ear surgery due to  mastoiditis  . Meniscus repair Left 2004  . Shoulder surgery Right 11/2002  . Foot surgery Left 2005    Fot repair after chainsaw accident  . Pacemaker insertion  11/26/08    Arthur Daniel  . Shoulder surgery Right 2011  . Cataract extraction, bilateral  08/2010  . Flexible sigmoidoscopy  2001  . Colonoscopy  2001, 2008  . Thyroglossal duct cyst    . Shoulder arthroscopy with subacromial decompression Left 10/20/2013    Procedure: LEFT SHOULDER SCOPE/DISTAL ACROMIOPLASTY, LABRAL DEBRIDMENT;  Surgeon: Arthur Salen, MD;  Location: [facility];  Service: Orthopedics;  Laterality: Left;     Current Outpatient Prescriptions  Medication Sig Dispense Refill  . ALPRAZolam (XANAX) 1 MG tablet Take 0.5 mg by mouth daily.     Arthur Daniel aspirin 81 MG tablet Take 81 mg by mouth daily.    Arthur Daniel BYSTOLIC 5 MG tablet Take 2.5 mg by mouth daily.     . Flaxseed, Linseed, (FLAXSEED OIL) 1200 MG CAPS Take 1 capsule by mouth daily.    Arthur Daniel levothyroxine (SYNTHROID, LEVOTHROID) 175 MCG tablet Take 175 mcg by mouth daily before breakfast.     . lisinopril (PRINIVIL,ZESTRIL) 5 MG tablet Take 0.5 tablets (2.5 mg total) by mouth daily. 15 tablet 11  . NON FORMULARY C- PAP  MACHINE    . pravastatin (PRAVACHOL) 40 MG tablet Take 40 mg by mouth daily.    . tamsulosin (FLOMAX) 0.4 MG CAPS capsule Take 0.4 mg by mouth daily.     No current facility-administered medications for this visit.    Allergies:   Lipitor    Social History:  The patient  reports that he quit smoking about 9 years ago. His smoking use included Cigarettes. He does not have any smokeless tobacco history on file. He reports that he drinks alcohol. He reports that he does not use illicit drugs.   Family History:  The patient's family history includes CAD in his brother and mother; CVA in his mother; Cancer in his sister; Cirrhosis in his father; Diabetes in his mother; Goiter in his mother; Heart attack (age of onset: 52) in his son;  Hypercholesterolemia in his brother and mother; Hypertension in his brother and mother.    ROS:  Please see the history of present illness.   Otherwise, review of systems are positive for episode of chest pain as noted above.   All other systems are reviewed and negative.    PHYSICAL EXAM: VS:  BP 130/80 mmHg  Pulse 62  Ht 6\' 1"  (1.854 m)  Wt 280 lb (127.007 kg)  BMI 36.95 kg/m2 , BMI Body mass index is 36.95 kg/(m^2). GEN: Well nourished, well developed, in no acute distress HEENT: normal Neck: no JVD, carotid bruits, or masses Cardiac: RRR; no murmurs, rubs, or gallops,no edema  Respiratory:  clear to auscultation bilaterally, normal work of breathing GI: soft, nontender, nondistended, + BS MS: no deformity or atrophy Skin: warm and dry, no rash Neuro:  Strength and sensation are intact Psych: euthymic mood, full affect   EKG:   The ekg ordered June 9 demonstrates normal   Recent Labs: 06/25/2015: BUN 14; Creatinine, Ser 0.93; Hemoglobin 15.3; Platelets 140*; Potassium 4.2; Sodium 138   Lipid Panel No results found for: CHOL, TRIG, HDL, CHOLHDL, VLDL, LDLCALC, LDLDIRECT   Other studies Reviewed: Additional studies/ records that were reviewed today with results demonstrating: ER records reviewed. Negative troponin; prior stress test negative in 2015   ASSESSMENT AND PLAN:  1. AFib: occurred when he was hyperthyroid.  No knownrecurrence to this point. 2. Hyperlipidemia: continue pravastatin 3. HTN: continue lisinopril. Blood pressure well controlled. 4. Chest pain: one episode under stressful circumstances. We'll continue to monitor for symptoms. If he has recurrence, plan for stress test or cath. He thinks this is all stress related. He would prefer to hold off on any testing at this time. 5. Obesity: he has lost weight through diet control. He will try to walk more. I congratulated him on his weight loss.   Current medicines are reviewed at length with the patient  today.  The patient concerns regarding his medicines were addressed.  The following changes have been made:  No change  Labs/ tests ordered today include:  No orders of the defined types were placed in this encounter.    Recommend 150 minutes/week of aerobic exercise Low fat, low carb, high fiber diet recommended  Disposition:   FU in 1 year   Signed, Larae Grooms, MD  06/28/2015 11:45 AM    [address], [place], [place]  [address] Phone: [phone]; Fax: [phone]

## 2015-06-28 ENCOUNTER — Encounter: Payer: Self-pay | Admitting: Interventional Cardiology

## 2015-06-28 ENCOUNTER — Ambulatory Visit (INDEPENDENT_AMBULATORY_CARE_PROVIDER_SITE_OTHER): Payer: BLUE CROSS/BLUE SHIELD | Admitting: Interventional Cardiology

## 2015-06-28 VITALS — BP 130/80 | HR 62 | Ht 73.0 in | Wt 280.0 lb

## 2015-06-28 DIAGNOSIS — R072 Precordial pain: Secondary | ICD-10-CM

## 2015-06-28 DIAGNOSIS — I10 Essential (primary) hypertension: Secondary | ICD-10-CM

## 2015-06-28 DIAGNOSIS — E669 Obesity, unspecified: Secondary | ICD-10-CM

## 2015-06-28 DIAGNOSIS — I480 Paroxysmal atrial fibrillation: Secondary | ICD-10-CM

## 2015-06-28 DIAGNOSIS — E785 Hyperlipidemia, unspecified: Secondary | ICD-10-CM

## 2015-06-28 NOTE — Patient Instructions (Signed)

## 2015-07-05 ENCOUNTER — Ambulatory Visit (INDEPENDENT_AMBULATORY_CARE_PROVIDER_SITE_OTHER): Payer: BLUE CROSS/BLUE SHIELD | Admitting: *Deleted

## 2015-07-05 ENCOUNTER — Telehealth: Payer: Self-pay | Admitting: Internal Medicine

## 2015-07-05 DIAGNOSIS — Z950 Presence of cardiac pacemaker: Secondary | ICD-10-CM

## 2015-07-05 DIAGNOSIS — I495 Sick sinus syndrome: Secondary | ICD-10-CM | POA: Diagnosis not present

## 2015-07-05 NOTE — Telephone Encounter (Signed)
LMOVM informing pt that his home monitor is automatic and we have already received his remote transmission.

## 2015-07-05 NOTE — Progress Notes (Signed)
Remote pacemaker transmission.   

## 2015-07-05 NOTE — Telephone Encounter (Signed)
New message  Pt is not sure how to send a signal. Please call to clarify.

## 2015-07-06 LAB — CUP PACEART REMOTE DEVICE CHECK
Battery Remaining Longevity: 77 mo
Battery Remaining Percentage: 65 %
Brady Statistic AP VS Percent: 37 %
Brady Statistic AS VS Percent: 62 %
Brady Statistic RA Percent Paced: 36 %
Brady Statistic RV Percent Paced: 1 %
Date Time Interrogation Session: 20170619060325
Implantable Lead Implant Date: 20101111
Implantable Lead Location: 753859
Implantable Lead Location: 753860
Lead Channel Impedance Value: 350 Ohm
Lead Channel Pacing Threshold Amplitude: 1 V
Lead Channel Pacing Threshold Pulse Width: 0.4 ms
Lead Channel Sensing Intrinsic Amplitude: 3.4 mV
MDC IDC LEAD IMPLANT DT: 20101111
MDC IDC MSMT BATTERY VOLTAGE: 2.9 V
MDC IDC MSMT LEADCHNL RA PACING THRESHOLD AMPLITUDE: 1 V
MDC IDC MSMT LEADCHNL RV IMPEDANCE VALUE: 380 Ohm
MDC IDC MSMT LEADCHNL RV PACING THRESHOLD PULSEWIDTH: 0.4 ms
MDC IDC MSMT LEADCHNL RV SENSING INTR AMPL: 7.3 mV
MDC IDC SET LEADCHNL RA PACING AMPLITUDE: 2 V
MDC IDC SET LEADCHNL RV PACING AMPLITUDE: 2.5 V
MDC IDC SET LEADCHNL RV PACING PULSEWIDTH: 0.4 ms
MDC IDC SET LEADCHNL RV SENSING SENSITIVITY: 2 mV
MDC IDC STAT BRADY AP VP PERCENT: 1 %
MDC IDC STAT BRADY AS VP PERCENT: 1 %
Pulse Gen Model: 2210
Pulse Gen Serial Number: 7079515

## 2015-07-09 ENCOUNTER — Encounter: Payer: Self-pay | Admitting: Cardiology

## 2015-09-10 DIAGNOSIS — G4733 Obstructive sleep apnea (adult) (pediatric): Secondary | ICD-10-CM | POA: Diagnosis not present

## 2015-09-14 DIAGNOSIS — J069 Acute upper respiratory infection, unspecified: Secondary | ICD-10-CM | POA: Diagnosis not present

## 2015-09-14 DIAGNOSIS — K429 Umbilical hernia without obstruction or gangrene: Secondary | ICD-10-CM | POA: Diagnosis not present

## 2015-09-17 ENCOUNTER — Ambulatory Visit (INDEPENDENT_AMBULATORY_CARE_PROVIDER_SITE_OTHER): Payer: BLUE CROSS/BLUE SHIELD | Admitting: Family Medicine

## 2015-09-17 ENCOUNTER — Encounter: Payer: Self-pay | Admitting: Family Medicine

## 2015-09-17 VITALS — BP 102/52 | HR 61 | Temp 97.9°F | Ht 73.0 in | Wt 278.2 lb

## 2015-09-17 DIAGNOSIS — I952 Hypotension due to drugs: Secondary | ICD-10-CM

## 2015-09-17 DIAGNOSIS — R208 Other disturbances of skin sensation: Secondary | ICD-10-CM

## 2015-09-17 NOTE — Progress Notes (Signed)
Chief Complaint  Patient presents with  . Establish Care    pt states his upper chest and stomach is sore-x 6 days       New Patient Visit SUBJECTIVE: HPI: Arthur Daniel is an 75 y.o.male who is being seen for establishing care.  The patient was previously seen at [facility]. Poor continuity and high office turnover caused him to change providers.  Concerns: Upper chest and stomach pain Duration: 6 days Getting slightly better, affected area over the chest and upper abdomen. He was told by a previous provider that is a virus. Heat helps, but pain returns immediately after removal.  Has been having cold and hot spells this past week, no fevers. There are no skin changes. Muscles are not affected. This has never happened before, no recent illness, no injury, no sun exposure,  Possible exposure to saw dust.   Notes that his energy has not been what it has in the past. His BP is also low. He states he was started on Bystolic for an irregular heart beat that turned out to be Graves Disease. He has since had radiation for his thyroid and has a pacemaker, thus eliminating his issue.  Allergies  Allergen Reactions  . Lipitor [Atorvastatin] Other (See Comments)    Breast lumps   Past Medical History:  Diagnosis Date  . Allergic rhinitis, cause unspecified   . Atrial fibrillation ([place])     Associated with hyperthyroidism/Graves' disease  . Calculus of kidney   . Diverticulosis   . ED (erectile dysfunction)   . Essential hypertension, benign   . Hematuria, unspecified   . History of chicken pox   . Hypercholesterolemia   . Hyperthyroidism   . Lump or mass in breast   . Mixed hyperlipidemia   . Obesity   . Pacemaker    [place], [place] 2010  . Postablative hypothyroidism   . Sleep apnea    Use CPAP machine  . Ureteral stone    Dr. Risa Grill   Past Surgical History:  Procedure Laterality Date  . ANTERIOR CRUCIATE LIGAMENT REPAIR Right 1994  . CATARACT EXTRACTION,  BILATERAL  08/2010  . COLONOSCOPY  2001, 2008  . FLEXIBLE SIGMOIDOSCOPY  2001  . FOOT SURGERY Left 2005   Fot repair after chainsaw accident  . MENISCUS REPAIR Left 2004  . OTHER SURGICAL HISTORY  1995   Ear surgery due to mastoiditis  . PACEMAKER INSERTION  11/26/08   [name]  . SHOULDER ARTHROSCOPY WITH SUBACROMIAL DECOMPRESSION Left 10/20/2013   Procedure: LEFT SHOULDER SCOPE/DISTAL ACROMIOPLASTY, LABRAL DEBRIDMENT;  Surgeon: [name], MD;  Location: [facility];  Service: Orthopedics;  Laterality: Left;  . SHOULDER SURGERY Right 11/2002  . SHOULDER SURGERY Right 2011  . THYROGLOSSAL DUCT CYST    . TONSILLECTOMY AND ADENOIDECTOMY     Social History   Social History  . Marital status: Married   Social History Main Topics  . Smoking status: Former Smoker    Types: Cigarettes    Quit date: 01/16/2006  . Smokeless tobacco: No     Comment: 1/2-1 pack a day  . Alcohol use Yes     Comment: rarely drinks wine  . Drug use: No   Family History  Problem Relation Age of Onset  . CVA Mother   . Hypercholesterolemia Mother   . Goiter Mother   . Diabetes Mother     DM  . Hypertension Mother   . CAD Mother   . Cirrhosis Father  due to alcohol  . Cancer Sister     Breast  . Hypercholesterolemia Brother   . Hypertension Brother   . CAD Brother   . Heart attack Son 40     Current Outpatient Prescriptions:  .  ALPRAZolam (XANAX) 1 MG tablet, Take 0.5 mg by mouth at bedtime as needed. , Disp: , Rfl:  .  aspirin 81 MG tablet, Take 81 mg by mouth daily., Disp: , Rfl:  .  Flaxseed, Linseed, (FLAXSEED OIL) 1200 MG CAPS, Take 1 capsule by mouth daily., Disp: , Rfl:  .  levothyroxine (SYNTHROID, LEVOTHROID) 175 MCG tablet, Take 175 mcg by mouth daily before breakfast. , Disp: , Rfl:  .  lisinopril (PRINIVIL,ZESTRIL) 5 MG tablet, Take 0.5 tablets (2.5 mg total) by mouth daily., Disp: 15 tablet, Rfl: 11 .  NON FORMULARY, C- PAP MACHINE, Disp: , Rfl:  .  pravastatin  (PRAVACHOL) 40 MG tablet, Take 40 mg by mouth daily., Disp: , Rfl:  .  tamsulosin (FLOMAX) 0.4 MG CAPS capsule, Take 0.4 mg by mouth daily., Disp: , Rfl:   ROS Consitutional: Denies fevers, chills  Cardiovascular: no palpitations  Abd: Denies N/V/D/C   OBJECTIVE: BP (!) 102/52 (BP Location: Left Arm, Patient Position: Sitting, Cuff Size: Large)   Pulse 61   Temp 97.9 F (36.6 C) (Oral)   Ht 6\' 1"  (1.854 m)   Wt 278 lb 3.2 oz (126.2 kg)   SpO2 98%   BMI 36.70 kg/m   Constitutional: -  VS reviewed -  Well developed, well nourished, appears stated age -  No apparent distress  Psychiatric: -  Oriented to person, place, and time -  Memory intact -  Affect and mood normal -  Fluent conversation, good eye contact -  Judgment and insight age appropriate  Eye: -  Conjunctivae clear, no discharge -  Pupils symmetric, round, reactive to light  ENMT: -  Ears are patent b/l without erythema or discharge. TM's are shiny and clear b/l without evidence of effusion or infection. -  Oral mucosa without lesions, tongue and uvula midline    Tonsils not enlarged, no erythema, no exudate, trachea midline    Pharynx moist, no lesions, no erythema  Neck: -  No gross swelling, no palpable masses -  Thyroid midline, not enlarged, mobile, no palpable masses  Cardiovascular: -  RRR, no murmurs -  No LE edema  Respiratory: -  Normal respiratory effort, no accessory muscle use, no retraction -  Breath sounds equal, no wheezes, no ronchi, no crackles  Gastrointestinal: -  Bowel sounds normal -  No tenderness, no distention, no guarding, no masses  Musculoskeletal: -  No clubbing, no cyanosis -  Gait normal  Skin: -  No significant lesion on inspection -  Under shirt, he is diaphoretic -  No TTP over chest, no erythema, scaling or drainage   ASSESSMENT/PLAN: Pain of skin  Hypotension due to drugs  Patient instructed to sign release of records form his previous PCP. Offered to call in topical  lidocaine patches or voltaren gel for relief. Pt declined as he feels he is improving adequately. I do not think this is fungal or bacterial. There are no skin changes. If things worsen or fail to improve, we could try to treat as if it were intertrigo or perhaps get a biopsy.  Stop Bystolic given his normal BP and resolution of initial issue that it was started for. May stop Lisinopril depending on records. He will continue to monitor  his home BP and let us know the readings at his f/u. Patient should return in 1-3 mo at earliest convenience for cpx (he does have [place]). The patient voiced understanding and agreement to the plan.   Crosby Oyster [place]

## 2015-09-17 NOTE — Patient Instructions (Addendum)
Stop your Bystolic for now until I can review your records from [place].  Let us know if you have any desire to have something topical called in for your chest. Call our office if your chest issue worsens or fail to improve.

## 2015-09-17 NOTE — Progress Notes (Signed)
Pre visit review using our clinic review tool, if applicable. No additional management support is needed unless otherwise documented below in the visit note. 

## 2015-09-24 ENCOUNTER — Encounter: Payer: Self-pay | Admitting: Family Medicine

## 2015-09-27 DIAGNOSIS — Z87891 Personal history of nicotine dependence: Secondary | ICD-10-CM | POA: Diagnosis not present

## 2015-09-27 DIAGNOSIS — H7011 Chronic mastoiditis, right ear: Secondary | ICD-10-CM | POA: Diagnosis not present

## 2015-10-04 ENCOUNTER — Ambulatory Visit (INDEPENDENT_AMBULATORY_CARE_PROVIDER_SITE_OTHER): Payer: BLUE CROSS/BLUE SHIELD | Admitting: *Deleted

## 2015-10-04 DIAGNOSIS — I495 Sick sinus syndrome: Secondary | ICD-10-CM | POA: Diagnosis not present

## 2015-10-04 NOTE — Progress Notes (Signed)
Remote pacemaker transmission.   

## 2015-10-06 ENCOUNTER — Encounter: Payer: Self-pay | Admitting: Cardiology

## 2015-10-07 ENCOUNTER — Encounter: Payer: Self-pay | Admitting: Family Medicine

## 2015-10-07 ENCOUNTER — Ambulatory Visit (INDEPENDENT_AMBULATORY_CARE_PROVIDER_SITE_OTHER): Payer: BLUE CROSS/BLUE SHIELD | Admitting: Family Medicine

## 2015-10-07 VITALS — BP 120/58 | HR 99 | Temp 98.5°F | Ht 73.0 in | Wt 283.2 lb

## 2015-10-07 DIAGNOSIS — M791 Myalgia, unspecified site: Secondary | ICD-10-CM

## 2015-10-07 DIAGNOSIS — J209 Acute bronchitis, unspecified: Secondary | ICD-10-CM

## 2015-10-07 MED ORDER — PREDNISONE 50 MG PO TABS: ORAL_TABLET | ORAL | 0 refills | Status: DC

## 2015-10-07 NOTE — Progress Notes (Signed)
Pre visit review using our clinic review tool, if applicable. No additional management support is needed unless otherwise documented below in the visit note. 

## 2015-10-07 NOTE — Patient Instructions (Addendum)
Keep pushing fluids. Practice good hand hygiene.  Go off of your cholesterol medication for 3-4 weeks.

## 2015-10-07 NOTE — Progress Notes (Signed)
Chief Complaint  Patient presents with  . Cough    product-yellow and wheezing-sxs started on Sun    TAVIS VANKLEECK here for URI complaints.  Duration: 4 days Associated symptoms: wheezing, sore throat, and productive cough, runny and stuffy nose Denies: Fevers, SOB, ear complaints Treatment to date: cough syrup, inhaler and amoxicillin from ENT.  Sick contacts: His friend was sick  Also states he is having issues with R side pain. He is concerned it may be related to his cholesterol medication. No injury or change in activity. No numbness or tingling.  ROS:  Const: Denies fevers HEENT: As noted in HPI Lungs: +cough  Past Medical History:  Diagnosis Date  . Allergic rhinitis, cause unspecified   . Atrial fibrillation ([place])     Associated with hyperthyroidism/Graves' disease  . Calculus of kidney   . Diverticulosis   . ED (erectile dysfunction)   . Essential hypertension, benign   . Hematuria, unspecified   . History of chicken pox   . Hypercholesterolemia   . Lump or mass in breast   . Mixed hyperlipidemia   . Obesity   . Pacemaker    [place], [place] 2010  . Postablative hypothyroidism   . Sleep apnea    Use CPAP machine  . Ureteral stone    Dr. Risa Grill   Family History  Problem Relation Age of Onset  . CVA Mother   . Hypercholesterolemia Mother   . Goiter Mother   . Diabetes Mother     DM  . Hypertension Mother   . CAD Mother   . Cirrhosis Father     due to alcohol  . Cancer Sister     Breast  . Hypercholesterolemia Brother   . Hypertension Brother   . CAD Brother   . Heart attack Son 40    BP (!) 120/58 (BP Location: Left Arm, Patient Position: Sitting, Cuff Size: Large)   Pulse 99   Temp 98.5 F (36.9 C) (Oral)   Ht 6\' 1"  (1.854 m)   Wt 283 lb 3.2 oz (128.5 kg)   SpO2 97%   BMI 37.36 kg/m  General: Awake, alert, appears stated age HEENT: AT, Gray, ears patent b/l and TM neg on L, purple appearing (recent wax removal) on the R, nares patent w/o  discharge, pharynx pink and without exudates, MMM Neck: No masses or asymmetry Heart: RRR, no murmurs, no LE edema Lungs: Faint wheezes heard in bases, no accessory muscle use MSK: No TTP over the R rib cage, no muscle group atrophy or asymmetry Psych: Age appropriate judgment and insight, normal mood and affect  Acute wheezy bronchitis - Plan: predniSONE (DELTASONE) 50 MG tablet  Myalgia  Orders as above. Stop Augmentin, OK to keep using anticholinergic nasal spray. Wash hands often, push fluids. OK to take drug holiday for next few weeks. If no improvement, will address msk cause. F/u in 1 week if symptoms worsen or fail to improve. Pt voiced understanding and agreement to the plan.  [place], DO 10/07/15 2:22 PM

## 2015-10-08 ENCOUNTER — Encounter: Payer: Self-pay | Admitting: Family Medicine

## 2015-10-18 ENCOUNTER — Encounter: Payer: Self-pay | Admitting: Family Medicine

## 2015-10-18 ENCOUNTER — Telehealth: Payer: Self-pay | Admitting: Medical

## 2015-10-18 ENCOUNTER — Ambulatory Visit (INDEPENDENT_AMBULATORY_CARE_PROVIDER_SITE_OTHER): Payer: BLUE CROSS/BLUE SHIELD | Admitting: Family Medicine

## 2015-10-18 VITALS — BP 112/68 | HR 84 | Temp 98.3°F | Ht 73.0 in | Wt 277.2 lb

## 2015-10-18 DIAGNOSIS — N401 Enlarged prostate with lower urinary tract symptoms: Secondary | ICD-10-CM

## 2015-10-18 DIAGNOSIS — Z0000 Encounter for general adult medical examination without abnormal findings: Secondary | ICD-10-CM | POA: Diagnosis not present

## 2015-10-18 DIAGNOSIS — R350 Frequency of micturition: Secondary | ICD-10-CM

## 2015-10-18 DIAGNOSIS — Z8639 Personal history of other endocrine, nutritional and metabolic disease: Secondary | ICD-10-CM

## 2015-10-18 DIAGNOSIS — E039 Hypothyroidism, unspecified: Secondary | ICD-10-CM | POA: Diagnosis not present

## 2015-10-18 DIAGNOSIS — N400 Enlarged prostate without lower urinary tract symptoms: Secondary | ICD-10-CM | POA: Insufficient documentation

## 2015-10-18 DIAGNOSIS — Z131 Encounter for screening for diabetes mellitus: Secondary | ICD-10-CM

## 2015-10-18 DIAGNOSIS — I10 Essential (primary) hypertension: Secondary | ICD-10-CM

## 2015-10-18 DIAGNOSIS — Z23 Encounter for immunization: Secondary | ICD-10-CM | POA: Diagnosis not present

## 2015-10-18 HISTORY — DX: Benign prostatic hyperplasia without lower urinary tract symptoms: N40.0

## 2015-10-18 LAB — PSA: PSA: 0.25 ng/mL (ref 0.10–4.00)

## 2015-10-18 LAB — COMPREHENSIVE METABOLIC PANEL
ALT: 16 U/L (ref 0–53)
AST: 13 U/L (ref 0–37)
Albumin: 3.7 g/dL (ref 3.5–5.2)
Alkaline Phosphatase: 32 U/L — ABNORMAL LOW (ref 39–117)
BUN: 10 mg/dL (ref 6–23)
CHLORIDE: 104 meq/L (ref 96–112)
CO2: 29 meq/L (ref 19–32)
CREATININE: 1.02 mg/dL (ref 0.40–1.50)
Calcium: 8.6 mg/dL (ref 8.4–10.5)
GFR: 76.13 mL/min (ref >60.00)
GLUCOSE: 120 mg/dL — AB (ref 70–99)
POTASSIUM: 4.6 meq/L (ref 3.5–5.1)
Sodium: 139 mEq/L (ref 135–145)
Total Bilirubin: 0.6 mg/dL (ref 0.2–1.2)
Total Protein: 6.8 g/dL (ref 6.0–8.3)

## 2015-10-18 LAB — T4, FREE: FREE T4: 1.04 ng/dL (ref 0.60–1.60)

## 2015-10-18 LAB — LIPID PANEL
CHOL/HDL RATIO: 6
Cholesterol: 239 mg/dL — ABNORMAL HIGH (ref 0–200)
HDL: 38.4 mg/dL — AB (ref >39.00)
LDL CALC: 171 mg/dL — AB (ref 0–99)
NONHDL: 200.76
Triglycerides: 149 mg/dL (ref 0.0–149.0)
VLDL: 29.8 mg/dL (ref 0.0–40.0)

## 2015-10-18 LAB — HEMOGLOBIN A1C: HEMOGLOBIN A1C: 6.2 % (ref 4.6–6.5)

## 2015-10-18 LAB — MICROALBUMIN / CREATININE URINE RATIO
Creatinine,U: 169.9 mg/dL
MICROALB/CREAT RATIO: 0.4 mg/g (ref 0.0–30.0)
Microalb, Ur: <0.7 mg/dL (ref 0.0–1.9)

## 2015-10-18 LAB — TSH: TSH: 3.51 u[IU]/mL (ref 0.35–4.50)

## 2015-10-18 MED ORDER — FINASTERIDE 5 MG PO TABS: 5.0000 mg | ORAL_TABLET | Freq: Every day | ORAL | 1 refills | Status: DC

## 2015-10-18 NOTE — Progress Notes (Signed)
Chief Complaint  Patient presents with  . Annual Exam    Well Male Arthur Daniel is here for a complete physical.   His last physical was >1 year(s) ago.  Current diet: healthy in general- Fruits, steamed vegetables, fish, chicken Current exercise: walking Weight trend: decreasing steadily Does pt snore? Yes.  - has no sleep apnea Daytime fatigue? No. Seat belt? Yes.   Concerns:  BPH Patient has a history of BPH and is still having symptoms despite being on twice daily Flomax. He has urinary frequency and intermittent incontinence. No blood in the urine, constipation, or pain with urination otherwise.   Hypertension Patient presents for hypertension follow up. He does monitor home blood pressures. Blood pressures ranging from 90-100's/60's. He is compliant with medications- Lisinopril 5 mg daily. Patient has these side effects of medication: none He specifically denies headache, visual changes, chest pain, palpitations, dyspnea, orthopnea, PND or peripheral edema. He is adhering to a low sodium and low fat diet. He exercises every other day.   Past Medical History:  Diagnosis Date  . Allergic rhinitis, cause unspecified   . Atrial fibrillation ([place])     Associated with hyperthyroidism/Graves' disease  . BPH (benign prostatic hyperplasia) 10/18/2015  . Calculus of kidney   . Diverticulosis   . ED (erectile dysfunction)   . Essential hypertension, benign   . Hematuria, unspecified   . History of chicken pox   . Hypercholesterolemia   . Lump or mass in breast   . Mixed hyperlipidemia   . Obesity   . Pacemaker    [place], [place] 2010  . Postablative hypothyroidism   . Sleep apnea    Use CPAP machine  . Ureteral stone    Dr. Risa Grill    Past Surgical History:  Procedure Laterality Date  . ANTERIOR CRUCIATE LIGAMENT REPAIR Right 1994  . CATARACT EXTRACTION, BILATERAL  08/2010  . COLONOSCOPY  2001, 2008  . FLEXIBLE SIGMOIDOSCOPY  2001  . FOOT SURGERY Left 2005   Fot  repair after chainsaw accident  . MENISCUS REPAIR Left 2004  . OTHER SURGICAL HISTORY  1995   Ear surgery due to mastoiditis  . PACEMAKER INSERTION  11/26/08   [name]  . SHOULDER ARTHROSCOPY WITH SUBACROMIAL DECOMPRESSION Left 10/20/2013   Procedure: LEFT SHOULDER SCOPE/DISTAL ACROMIOPLASTY, LABRAL DEBRIDMENT;  Surgeon: [name], MD;  Location: [facility];  Service: Orthopedics;  Laterality: Left;  . SHOULDER SURGERY Right 11/2002  . SHOULDER SURGERY Right 2011  . THYROGLOSSAL DUCT CYST    . TONSILLECTOMY AND ADENOIDECTOMY     Medications  Current Outpatient Prescriptions on File Prior to Visit  Medication Sig Dispense Refill  . aspirin 81 MG tablet Take 81 mg by mouth daily.    . Flaxseed, Linseed, (FLAXSEED OIL) 1200 MG CAPS Take 1 capsule by mouth daily.    Marland Kitchen levothyroxine (SYNTHROID, LEVOTHROID) 175 MCG tablet Take 175 mcg by mouth daily before breakfast.     . NON FORMULARY C- PAP MACHINE    . tamsulosin (FLOMAX) 0.4 MG CAPS capsule Take 0.4 mg by mouth daily.     Allergies Allergies  Allergen Reactions  . Lipitor [Atorvastatin] Other (See Comments)    Breast lumps   Family History Family History  Problem Relation Age of Onset  . CVA Mother   . Hypercholesterolemia Mother   . Goiter Mother   . Diabetes Mother     DM  . Hypertension Mother   . CAD Mother   .  Cirrhosis Father     due to alcohol  . Cancer Sister     Breast  . Hypercholesterolemia Brother   . Hypertension Brother   . CAD Brother   . Heart attack Son 40    Review of Systems: Constitutional:  no unexpected change in weight, no fevers or chills Eye:  no recent significant change in vision Ear/Nose/Mouth/Throat:  Ears:  no tinnitus or hearing loss Nose/Mouth/Throat:  no complaints of nasal congestion or bleeding, no sore throat and oral sores Cardiovascular:  no chest pain, no palpitations Respiratory:  no cough and no shortness of breath Gastrointestinal:  no abdominal pain, no  change in bowel habits, no nausea, vomiting, diarrhea, or constipation and no black or bloody stool GU:  Male: negative for dysuria, frequency, and incontinence and negative for prostate symptoms Musculoskeletal/Extremities:  +chronic joint pain, redness, or swelling of the joints Integumentary (Skin/Breast):  no abnormal skin lesions reported Neurologic:  no headaches, no numbness, tingling Endocrine:  weight changes, masses in the neck, heat/cold intolerance, bowel or skin changes, or cardiovascular system symptoms Hematologic/Lymphatic:  no abnormal bleeding, no HIV risk factors, no night sweats, no swollen nodes, no weight loss  Exam BP 112/68   Pulse 84   Temp 98.3 F (36.8 C) (Oral)   Ht 6\' 1"  (1.854 m)   Wt 277 lb 3.2 oz (125.7 kg)   SpO2 97%   BMI 36.57 kg/m  General:  well developed, well nourished, in no apparent distress Skin:  no significant moles, warts, or growths Head:  no masses, lesions, or tenderness Eyes:  pupils equal and round, sclera anicteric without injection Ears:  canals without lesions, TMs shiny without retraction, no obvious effusion, no erythema Nose:  nares patent, septum midline, mucosa normal Throat/Pharynx:  lips and gingiva without lesion; tongue and uvula midline; non-inflamed pharynx; no exudates or postnasal drainage Neck: neck supple without adenopathy, thyromegaly, or masses Lungs:  clear to auscultation, breath sounds equal bilaterally, no respiratory distress Cardio:  regular rate and rhythm without murmurs, heart sounds without clicks or rubs Abdomen:  abdomen soft, nontender; bowel sounds normal; no masses or organomegaly Genital (male): Deferred Rectal: Tone normal, no external lesions, prostate smooth and without masses, it is moderately enlarged. No rectal masses appreciated. Musculoskeletal:  symmetrical muscle groups noted without atrophy or deformity Extremities:  no clubbing, cyanosis, or edema, no deformities, no skin  discoloration Neuro:  gait normal; deep tendon reflexes normal and symmetric Psych: well oriented with normal range of affect and appropriate judgment/insight  Assessment and Plan  Well adult exam  Benign prostatic hyperplasia with urinary frequency - Plan: PSA, finasteride (PROSCAR) 5 MG tablet  Essential hypertension - Plan: Comprehensive metabolic panel, Microalbumin / creatinine urine ratio  Hypothyroidism, unspecified type - Plan: T4, free, TSH  Screening for diabetes mellitus - Plan: Hemoglobin A1c  History of hyperlipidemia - Plan: Lipid panel   Well 75 y.o. male. Counseled on diet and exercise. If he is still having symptoms on the Finasteride, will refer to Urology for another opinion. Stop Lisinopril, Xanax, OK to keep taking supplements. He is on a drug holiday from statin; he will restart in 3 weeks and stay well hydrated. He is to stop if his muscle aches return. Coenzyme Q10 if he is still having issues. After that, we could try another statin, but may have to resort to Zetia depending on his risk. Immunizations, labs, and further orders as above. Follow up in 6 weeks. The patient voiced understanding and agreement  to the plan.  [place], DO 10/18/15 9:17 AM

## 2015-10-18 NOTE — Progress Notes (Signed)
Pre visit review using our clinic tool,if applicable. No additional management support is needed unless otherwise documented below in the visit note.  

## 2015-10-18 NOTE — Addendum Note (Signed)
Addended by: Bunnie Domino on: 10/18/2015 09:55 AM   Modules accepted: Orders

## 2015-10-18 NOTE — Patient Instructions (Addendum)
OK to restart Pravastatin in 3 weeks. Remember to take it daily.  Stop the Lisinopril.  Keep exercising!

## 2015-10-18 NOTE — Telephone Encounter (Signed)
Pt had flu vaccine today. But appears saw Dr. Nani Ravens for CPE. So was the order accidentally sent to me to sign off? If so can you send it to him? Was it accidentally sent to me.

## 2015-10-29 LAB — CUP PACEART REMOTE DEVICE CHECK
Battery Remaining Longevity: 68 mo
Battery Remaining Percentage: 57 %
Brady Statistic AP VP Percent: 1 %
Brady Statistic RA Percent Paced: 42 %
Brady Statistic RV Percent Paced: 1 %
Date Time Interrogation Session: 20170918063652
Implantable Lead Implant Date: 20101111
Implantable Lead Location: 753859
Lead Channel Impedance Value: 360 Ohm
Lead Channel Impedance Value: 380 Ohm
Lead Channel Pacing Threshold Pulse Width: 0.4 ms
Lead Channel Setting Pacing Pulse Width: 0.4 ms
Lead Channel Setting Sensing Sensitivity: 2 mV
MDC IDC LEAD IMPLANT DT: 20101111
MDC IDC LEAD LOCATION: 753860
MDC IDC MSMT BATTERY VOLTAGE: 2.89 V
MDC IDC MSMT LEADCHNL RA PACING THRESHOLD AMPLITUDE: 1 V
MDC IDC MSMT LEADCHNL RA SENSING INTR AMPL: 3.3 mV
MDC IDC MSMT LEADCHNL RV PACING THRESHOLD AMPLITUDE: 1 V
MDC IDC MSMT LEADCHNL RV PACING THRESHOLD PULSEWIDTH: 0.4 ms
MDC IDC MSMT LEADCHNL RV SENSING INTR AMPL: 9 mV
MDC IDC SET LEADCHNL RA PACING AMPLITUDE: 2 V
MDC IDC SET LEADCHNL RV PACING AMPLITUDE: 2.5 V
MDC IDC STAT BRADY AP VS PERCENT: 42 %
MDC IDC STAT BRADY AS VP PERCENT: 1 %
MDC IDC STAT BRADY AS VS PERCENT: 57 %
Pulse Gen Model: 2210
Pulse Gen Serial Number: 7079515

## 2015-11-29 ENCOUNTER — Encounter: Payer: Self-pay | Admitting: Family Medicine

## 2015-11-29 ENCOUNTER — Ambulatory Visit (INDEPENDENT_AMBULATORY_CARE_PROVIDER_SITE_OTHER): Payer: BLUE CROSS/BLUE SHIELD | Admitting: Family Medicine

## 2015-11-29 VITALS — BP 138/78 | HR 64 | Temp 97.7°F | Ht 73.0 in | Wt 280.0 lb

## 2015-11-29 DIAGNOSIS — N401 Enlarged prostate with lower urinary tract symptoms: Secondary | ICD-10-CM

## 2015-11-29 DIAGNOSIS — F4320 Adjustment disorder, unspecified: Secondary | ICD-10-CM | POA: Diagnosis not present

## 2015-11-29 DIAGNOSIS — R350 Frequency of micturition: Secondary | ICD-10-CM | POA: Diagnosis not present

## 2015-11-29 DIAGNOSIS — E785 Hyperlipidemia, unspecified: Secondary | ICD-10-CM | POA: Diagnosis not present

## 2015-11-29 DIAGNOSIS — Z634 Disappearance and death of family member: Secondary | ICD-10-CM

## 2015-11-29 LAB — LIPID PANEL
CHOL/HDL RATIO: 7
Cholesterol: 287 mg/dL — ABNORMAL HIGH (ref 0–200)
HDL: 39.5 mg/dL (ref >39.00)
LDL Cholesterol: 210 mg/dL — ABNORMAL HIGH (ref 0–99)
NonHDL: 247.61
TRIGLYCERIDES: 190 mg/dL — AB (ref 0.0–149.0)
VLDL: 38 mg/dL (ref 0.0–40.0)

## 2015-11-29 MED ORDER — TRAZODONE HCL 50 MG PO TABS: 50.0000 mg | ORAL_TABLET | Freq: Every evening | ORAL | 1 refills | Status: DC | PRN

## 2015-11-29 NOTE — Progress Notes (Signed)
Chief Complaint  Patient presents with  . Follow-up    Subjective Arthur Daniel is a 75 y.o. male who presents with urinary concerns f/u Symptoms were not controlled on Flomax- he was having frequency and incomplete emptying. He was started on Finasteride 6 weeks ago.  Symptoms include urinary frequency and incomplete emptying. He denies hematuria, urinary infection and renal insufficiency. He denies a +history of prostate cancer.  Cholesterol Pt states he started taking a grape extract and feels better than ever. He has not started on back on the pravastatin that was stopped due to concern for mylagia. He already has failed Lipitor 2/2 myalgias. He does not exercise routinely and for the most part adheres to a healthy diet. He is not know to have co-existing CAD.  Bereavement 10/1, his step son committed suicide. It was very traumatic for his wife, which is causing him issues as well. She cries for much of the evening which makes it difficult for him to sleep. He has been taking Xanax from a previous rx that has been helping. Not interested in seeing a bereavement counselor at this time.  Past Medical History:  Diagnosis Date  . Allergic rhinitis, cause unspecified   . Atrial fibrillation ([place])     Associated with hyperthyroidism/Graves' disease  . BPH (benign prostatic hyperplasia) 10/18/2015  . Calculus of kidney   . Diverticulosis   . ED (erectile dysfunction)   . Essential hypertension, benign   . Hematuria, unspecified   . History of chicken pox   . Lump or mass in breast   . Mixed hyperlipidemia   . Obesity   . Pacemaker    [place], [place] 2010  . Postablative hypothyroidism   . Sleep apnea    Use CPAP machine  . Ureteral stone    Dr. Risa Grill   Medications Current Outpatient Prescriptions on File Prior to Visit  Medication Sig Dispense Refill  . aspirin 81 MG tablet Take 81 mg by mouth daily.    . finasteride (PROSCAR) 5 MG tablet Take 1 tablet (5 mg total) by mouth  daily. 90 tablet 1  . Flaxseed, Linseed, (FLAXSEED OIL) 1200 MG CAPS Take 1 capsule by mouth daily.    . Garlic 123XX123 MG CAPS Take by mouth.    . levothyroxine (SYNTHROID, LEVOTHROID) 175 MCG tablet Take 175 mcg by mouth daily before breakfast.     . NON FORMULARY C- PAP MACHINE    . tamsulosin (FLOMAX) 0.4 MG CAPS capsule Take 0.4 mg by mouth daily.     Allergies Allergies  Allergen Reactions  . Lipitor [Atorvastatin] Other (See Comments)    Breast lumps    Review of Systems Constitutional:  no unexplained fevers, sweats, or chills MSK: Denies back pain GU: as noted in HPI Hematologic/Lymphatic: no night sweats, no swollen nodes  Exam BP 138/78 (BP Location: Left Arm, Patient Position: Sitting, Cuff Size: Large)   Pulse 64   Temp 97.7 F (36.5 C) (Oral)   Ht 6\' 1"  (1.854 m)   Wt 280 lb (127 kg)   SpO2 97%   BMI 36.94 kg/m  General:  well developed, well nourished, in no apparent distress Mouth: MMM, no erythema or exudate Lungs:  clear to auscultation, breath sounds equal bilaterally; normal effort without accessory muscle use Cardio:  regular rate and rhythm without murmurs, no bruits, no LE edema MSK: Normal gait, no TTP of lumbar paraspinal musculature or spinous processes, gait normal Neuro: No cerebellar signs Psych: well oriented with normal  range of affect and appropriate judgement/insight  Assessment and Plan  Hyperlipidemia, unspecified hyperlipidemia type - Plan: Lipid panel  Benign prostatic hyperplasia with urinary frequency  Bereavement due to life event - Plan: traZODone (DESYREL) 50 MG tablet  Orders as above. OK to continue supplements, will check cholesterol and see what his 10 yr CVD is. Continue on Finasteride, offered referral to urology, but he does not think he needs it at this time. I do not recommend taking Xanax for sleep. Trazodone instead on a prn basis. Follow up 5 mo to recheck cholesterol/HTN. The patient voiced understanding and  agreement to the plan.  [place], DO 11/29/15 8:44 AM

## 2015-11-29 NOTE — Progress Notes (Signed)
Pre visit review using our clinic review tool, if applicable. No additional management support is needed unless otherwise documented below in the visit note. 

## 2015-11-29 NOTE — Patient Instructions (Signed)
I will let you know if you need to go back on your cholesterol lowering medication.

## 2016-01-03 ENCOUNTER — Ambulatory Visit (INDEPENDENT_AMBULATORY_CARE_PROVIDER_SITE_OTHER): Payer: BLUE CROSS/BLUE SHIELD | Admitting: *Deleted

## 2016-01-03 DIAGNOSIS — I495 Sick sinus syndrome: Secondary | ICD-10-CM

## 2016-01-03 NOTE — Progress Notes (Signed)
Remote pacemaker transmission.   

## 2016-01-05 ENCOUNTER — Encounter: Payer: Self-pay | Admitting: Cardiology

## 2016-01-05 LAB — CUP PACEART REMOTE DEVICE CHECK
Battery Remaining Percentage: 57 %
Battery Voltage: 2.89 V
Brady Statistic AP VP Percent: 1 %
Brady Statistic AP VS Percent: 39 %
Brady Statistic AS VS Percent: 61 %
Brady Statistic RV Percent Paced: 1 %
Date Time Interrogation Session: 20171218071022
Implantable Lead Implant Date: 20101111
Implantable Lead Location: 753859
Implantable Pulse Generator Implant Date: 20101111
Lead Channel Impedance Value: 350 Ohm
Lead Channel Pacing Threshold Amplitude: 1 V
Lead Channel Pacing Threshold Amplitude: 1 V
Lead Channel Pacing Threshold Pulse Width: 0.4 ms
Lead Channel Setting Pacing Amplitude: 2 V
Lead Channel Setting Sensing Sensitivity: 2 mV
MDC IDC LEAD IMPLANT DT: 20101111
MDC IDC LEAD LOCATION: 753860
MDC IDC MSMT BATTERY REMAINING LONGEVITY: 68 mo
MDC IDC MSMT LEADCHNL RA PACING THRESHOLD PULSEWIDTH: 0.4 ms
MDC IDC MSMT LEADCHNL RA SENSING INTR AMPL: 3.6 mV
MDC IDC MSMT LEADCHNL RV IMPEDANCE VALUE: 400 Ohm
MDC IDC MSMT LEADCHNL RV SENSING INTR AMPL: 7.5 mV
MDC IDC PG SERIAL: 7079515
MDC IDC SET LEADCHNL RV PACING AMPLITUDE: 2.5 V
MDC IDC SET LEADCHNL RV PACING PULSEWIDTH: 0.4 ms
MDC IDC STAT BRADY AS VP PERCENT: 1 %
MDC IDC STAT BRADY RA PERCENT PACED: 38 %

## 2016-01-18 ENCOUNTER — Telehealth: Payer: Self-pay | Admitting: Family Medicine

## 2016-01-18 DIAGNOSIS — N401 Enlarged prostate with lower urinary tract symptoms: Secondary | ICD-10-CM

## 2016-01-18 NOTE — Telephone Encounter (Signed)
Caller name: Relationship to patient: Self Can be reached: [phone]  Pharmacy:  Reason for call: Need referral to [facility] (Dr. Aline August)

## 2016-01-19 NOTE — Telephone Encounter (Signed)
Last seen 11/29/15 for urinary issues. Please advise if ok to place referral or if patient needs to come in to be seen. TL/CMA

## 2016-01-19 NOTE — Telephone Encounter (Signed)
OK to refer. Dx BPH.

## 2016-01-19 NOTE — Telephone Encounter (Signed)
Referral placed. I have reached out to patient to alert them referral was placed. Unable to contact. LVMOM for return call. TL/CMA

## 2016-02-22 DIAGNOSIS — N401 Enlarged prostate with lower urinary tract symptoms: Secondary | ICD-10-CM | POA: Diagnosis not present

## 2016-02-22 DIAGNOSIS — R351 Nocturia: Secondary | ICD-10-CM | POA: Diagnosis not present

## 2016-03-23 ENCOUNTER — Encounter: Payer: Self-pay | Admitting: Internal Medicine

## 2016-03-28 NOTE — Progress Notes (Signed)
Patient Care Team: Shelda Pal, DO as PCP - General (Family Medicine)   HPI  Arthur Daniel is a 76 y.o. male Seen in follow-up for pacemaker implantation for symptomatic bradycardia associated with sinus node dysfunction  He has a history of atrial fibrillation with a CHADS-VASc score is 2 for age and hypertension  however, his atrial fibrillation occurred in the context of hyperthyroidism/Graves' disease which is now quiescent. He is treated currently with aspirin.  TSH 10/17 3.5  The patient denies chest pain, shortness of breath, nocturnal dyspnea, orthopnea or peripheral edema.  There have been no palpitations, lightheadedness or syncope.  He has treated sleep apnea        Echocardiogram 2010 demonstrated normal LV function.tta DATE TEST    2010/     Echo   EF 55-60 %   6/15    Myoview   EF 61 % Low risk, small apical ischemia          ECG from 2015 was reviewed  Past Medical History:  Diagnosis Date  . Allergic rhinitis, cause unspecified   . Atrial fibrillation ([place])     Associated with hyperthyroidism/Graves' disease  . BPH (benign prostatic hyperplasia) 10/18/2015  . Calculus of kidney   . Diverticulosis   . ED (erectile dysfunction)   . Essential hypertension, benign   . Hematuria, unspecified   . History of chicken pox   . Lump or mass in breast   . Mixed hyperlipidemia   . Obesity   . Pacemaker    [place], [place] 2010  . Postablative hypothyroidism   . Sleep apnea    Use CPAP machine  . Ureteral stone    Dr. Risa Grill    Past Surgical History:  Procedure Laterality Date  . ANTERIOR CRUCIATE LIGAMENT REPAIR Right 1994  . CATARACT EXTRACTION, BILATERAL  08/2010  . COLONOSCOPY  2001, 2008  . FLEXIBLE SIGMOIDOSCOPY  2001  . FOOT SURGERY Left 2005   Fot repair after chainsaw accident  . MENISCUS REPAIR Left 2004  . OTHER SURGICAL HISTORY  1995   Ear surgery due to mastoiditis  . PACEMAKER INSERTION  11/26/08   [name]  . SHOULDER  ARTHROSCOPY WITH SUBACROMIAL DECOMPRESSION Left 10/20/2013   Procedure: LEFT SHOULDER SCOPE/DISTAL ACROMIOPLASTY, LABRAL DEBRIDMENT;  Surgeon: [name], MD;  Location: [facility];  Service: Orthopedics;  Laterality: Left;  . SHOULDER SURGERY Right 11/2002  . SHOULDER SURGERY Right 2011  . THYROGLOSSAL DUCT CYST    . TONSILLECTOMY AND ADENOIDECTOMY      Current Outpatient Prescriptions  Medication Sig Dispense Refill  . alfuzosin (UROXATRAL) 10 MG 24 hr tablet Take 10 mg by mouth daily with breakfast.    . ALPRAZolam (XANAX) 1 MG tablet Take 1 mg by mouth at bedtime as needed for anxiety.    Marland Kitchen aspirin 81 MG tablet Take 81 mg by mouth daily.    . Flaxseed, Linseed, (FLAXSEED OIL) 1200 MG CAPS Take 1 capsule by mouth daily.    . Garlic 2229 MG CAPS Take by mouth.    . levothyroxine (SYNTHROID, LEVOTHROID) 175 MCG tablet Take 175 mcg by mouth daily before breakfast.     . Multiple Vitamins-Minerals (CENTRUM SILVER 50+MEN PO) Take 1 tablet by mouth daily.    . NON FORMULARY C- PAP MACHINE    . pravastatin (PRAVACHOL) 40 MG tablet Take 40 mg by mouth daily.     No current facility-administered medications for this visit.  Allergies  Allergen Reactions  . Lipitor [Atorvastatin] Other (See Comments)    Breast lumps    Review of Systems negative except from HPI and PMH  Physical Exam BP 132/70   Pulse 70   Ht 6\' 1"  (1.854 m)   Wt 297 lb (134.7 kg)   SpO2 96%   BMI 39.18 kg/m  Well developed and Morbidly obese  in no acute distress HENT normal Neck supple with JVP-flat Carotids brisk and full without bruits Clear Regular rate and rhythm, no murmurs or gallops Abd-soft with active BS without hepatomegaly No Clubbing cyanosis edema Skin-warm and dry A & Oriented  Grossly normal sensory and motor function   ECG was ordered today demonstrated .apacing At 63  19/09/40 Otherwise normal Assessment and  Plan  Sinus node dysfunction     Pacemaker-St. Jude  The patient's device was interrogated.  The information was reviewed. No changes were made in the programming.     Atrial fibrillation  Non since euthyroid  Hypertension  Morbidly obese   Blood pressure well controlled   Reasonable heart rate excursion  with good exercise tolerance  Encouraged diet and weight loss

## 2016-03-30 ENCOUNTER — Ambulatory Visit (INDEPENDENT_AMBULATORY_CARE_PROVIDER_SITE_OTHER): Payer: BLUE CROSS/BLUE SHIELD | Admitting: Internal Medicine

## 2016-03-30 ENCOUNTER — Encounter: Payer: Self-pay | Admitting: Internal Medicine

## 2016-03-30 VITALS — BP 132/70 | HR 70 | Ht 73.0 in | Wt 297.0 lb

## 2016-03-30 DIAGNOSIS — I495 Sick sinus syndrome: Secondary | ICD-10-CM

## 2016-03-30 DIAGNOSIS — I480 Paroxysmal atrial fibrillation: Secondary | ICD-10-CM

## 2016-03-30 DIAGNOSIS — Z950 Presence of cardiac pacemaker: Secondary | ICD-10-CM | POA: Diagnosis not present

## 2016-03-30 LAB — CUP PACEART INCLINIC DEVICE CHECK
Date Time Interrogation Session: 20180315132843
Implantable Lead Implant Date: 20101111
Implantable Lead Location: 753859
Implantable Lead Location: 753860
Implantable Pulse Generator Implant Date: 20101111
Lead Channel Impedance Value: 337.5 Ohm
Lead Channel Pacing Threshold Amplitude: 0.75 V
Lead Channel Sensing Intrinsic Amplitude: 3.7 mV
Lead Channel Sensing Intrinsic Amplitude: 8.3 mV
Lead Channel Setting Pacing Amplitude: 2.5 V
Lead Channel Setting Pacing Pulse Width: 0.4 ms
MDC IDC LEAD IMPLANT DT: 20101111
MDC IDC MSMT BATTERY VOLTAGE: 2.87 V
MDC IDC MSMT LEADCHNL RA PACING THRESHOLD PULSEWIDTH: 0.4 ms
MDC IDC MSMT LEADCHNL RV IMPEDANCE VALUE: 412.5 Ohm
MDC IDC MSMT LEADCHNL RV PACING THRESHOLD AMPLITUDE: 1 V
MDC IDC MSMT LEADCHNL RV PACING THRESHOLD PULSEWIDTH: 0.4 ms
MDC IDC PG SERIAL: 7079515
MDC IDC SET LEADCHNL RA PACING AMPLITUDE: 2 V
MDC IDC SET LEADCHNL RV SENSING SENSITIVITY: 2 mV
MDC IDC STAT BRADY RA PERCENT PACED: 34 %
MDC IDC STAT BRADY RV PERCENT PACED: 0.35 %
Pulse Gen Model: 2210

## 2016-03-30 NOTE — Patient Instructions (Addendum)
Medication Instructions: - Your physician recommends that you continue on your current medications as directed. Please refer to the Current Medication list given to you today.  Labwork: - none ordered  Procedures/Testing: - none ordered  Follow-Up: - Remote monitoring is used to monitor your Pacemaker of ICD from home. This monitoring reduces the number of office visits required to check your device to one time per year. It allows [place] to keep an eye on the functioning of your device to ensure it is working properly. You are scheduled for a device check from home on 06/29/16. You may send your transmission at any time that day. If you have a wireless device, the transmission will be sent automatically. After your physician reviews your transmission, you will receive a postcard with your next transmission date.   - Your physician wants you to follow-up in: 1 year with Dr. [name]. You will receive a reminder letter in the mail two months in advance. If you don't receive a letter, please call our office to schedule the follow-up appointment.  Any Additional Special Instructions Will Be Listed Below (If Applicable).     If you need a refill on your cardiac medications before your next appointment, please call your pharmacy.

## 2016-04-04 DIAGNOSIS — R351 Nocturia: Secondary | ICD-10-CM | POA: Diagnosis not present

## 2016-04-04 DIAGNOSIS — N401 Enlarged prostate with lower urinary tract symptoms: Secondary | ICD-10-CM | POA: Diagnosis not present

## 2016-04-04 DIAGNOSIS — N3281 Overactive bladder: Secondary | ICD-10-CM | POA: Diagnosis not present

## 2016-04-28 ENCOUNTER — Encounter: Payer: Self-pay | Admitting: Family Medicine

## 2016-04-28 ENCOUNTER — Ambulatory Visit (INDEPENDENT_AMBULATORY_CARE_PROVIDER_SITE_OTHER): Payer: BLUE CROSS/BLUE SHIELD | Admitting: Family Medicine

## 2016-04-28 VITALS — BP 124/70 | HR 63 | Temp 97.9°F | Ht 73.0 in | Wt 295.6 lb

## 2016-04-28 DIAGNOSIS — I10 Essential (primary) hypertension: Secondary | ICD-10-CM | POA: Diagnosis not present

## 2016-04-28 DIAGNOSIS — Z9189 Other specified personal risk factors, not elsewhere classified: Secondary | ICD-10-CM | POA: Diagnosis not present

## 2016-04-28 HISTORY — DX: Other specified personal risk factors, not elsewhere classified: Z91.89

## 2016-04-28 NOTE — Progress Notes (Signed)
Chief Complaint  Patient presents with  . Follow-up    5 mos on HTN and cholesterol    Subjective Arthur Daniel is a 76 y.o. male who presents for hypertension follow up. He does monitor home blood pressures. Blood pressures ranging from 120's/70's on average. He is compliant with medications- currently diet controlled- was taken off of Lisinopril 5 mg daily around 5 mo ago He is adhering to a healthy diet overall. Current exercise: none  Dyslipidemia Patient presents for dyslipidemia follow up. Compliance with treatment thus far has been good- pravastatin 40 mg daily. He denies myalgias. He is not adhering to a low sodium and low fat diet. The patient exercises never.  Diet and exercise has taken a poor turn after some deaths in the family.  The patient is not known to have coexisting coronary artery disease.    Past Medical History:  Diagnosis Date  . 10 year risk of MI or stroke 7.5% or greater 04/28/2016  . Allergic rhinitis, cause unspecified   . Atrial fibrillation ([place])     Associated with hyperthyroidism/Graves' disease  . BPH (benign prostatic hyperplasia) 10/18/2015  . Calculus of kidney   . Diverticulosis   . ED (erectile dysfunction)   . Essential hypertension, benign   . Hematuria, unspecified   . History of chicken pox   . Lump or mass in breast   . Mixed hyperlipidemia   . Obesity   . Pacemaker    [place], [place] 2010  . Postablative hypothyroidism   . Sleep apnea    Use CPAP machine  . Ureteral stone    Dr. Risa Grill   Family History  Problem Relation Age of Onset  . CVA Mother   . Hypercholesterolemia Mother   . Goiter Mother   . Diabetes Mother     DM  . Hypertension Mother   . CAD Mother   . Cirrhosis Father     due to alcohol  . Cancer Sister     Breast  . Hypercholesterolemia Brother   . Hypertension Brother   . CAD Brother   . Heart attack Son 40     Medications Current Outpatient Prescriptions on File Prior to Visit  Medication  Sig Dispense Refill  . alfuzosin (UROXATRAL) 10 MG 24 hr tablet Take 10 mg by mouth at bedtime.     Marland Kitchen aspirin 81 MG tablet Take 81 mg by mouth daily.    . Flaxseed, Linseed, (FLAXSEED OIL) 1200 MG CAPS Take 1 capsule by mouth daily.    . Garlic 5956 MG CAPS Take by mouth.    . levothyroxine (SYNTHROID, LEVOTHROID) 175 MCG tablet Take 175 mcg by mouth daily before breakfast.     . Multiple Vitamins-Minerals (CENTRUM SILVER 50+MEN PO) Take 1 tablet by mouth daily.    . NON FORMULARY C- PAP MACHINE    . pravastatin (PRAVACHOL) 40 MG tablet Take 40 mg by mouth daily.      Allergies Allergies  Allergen Reactions  . Lipitor [Atorvastatin] Other (See Comments)    Breast lumps    Review of Systems Cardiovascular: no chest pain Respiratory:  no shortness of breath  Exam BP 124/70 (BP Location: Left Arm, Patient Position: Sitting, Cuff Size: Large)   Pulse 63   Temp 97.9 F (36.6 C) (Oral)   Ht 6\' 1"  (1.854 m)   Wt 295 lb 9.6 oz (134.1 kg)   SpO2 97%   BMI 39.00 kg/m  General:  well developed, well nourished, in no  apparent distress Skin:  warm, no pallor or diaphoresis Eyes:  pupils equal and round, sclera anicteric without injection Heart :RRR, no murmurs, no bruits, no LE edema Lungs:  clear to auscultation, no accessory muscle use Psych: well oriented with normal range of affect and appropriate judgment/insight  Essential hypertension  10 year risk of MI or stroke 7.5% or greater  OK to stay off of HTN medication. Check home BP prn. Cont on pravastatin.  No labs today. Counseled on diet and exercise- needs to get better with this, pt is motivated after seeing how much weight he has gained.  F/u in 6 mo. The patient voiced understanding and agreement to the plan.  [place], DO 04/28/16  8:57 AM

## 2016-04-28 NOTE — Progress Notes (Signed)
Pre visit review using our clinic review tool, if applicable. No additional management support is needed unless otherwise documented below in the visit note. 

## 2016-04-28 NOTE — Patient Instructions (Addendum)
Let us know if you need any refills.  OK to check BP whenever you want now.

## 2016-05-03 ENCOUNTER — Ambulatory Visit (INDEPENDENT_AMBULATORY_CARE_PROVIDER_SITE_OTHER): Payer: BLUE CROSS/BLUE SHIELD | Admitting: Family Medicine

## 2016-05-03 ENCOUNTER — Encounter: Payer: Self-pay | Admitting: Family Medicine

## 2016-05-03 ENCOUNTER — Other Ambulatory Visit (HOSPITAL_COMMUNITY)
Admission: RE | Admit: 2016-05-03 | Discharge: 2016-05-03 | Disposition: A | Discharge status: Home / Self Care | Payer: BLUE CROSS/BLUE SHIELD | Source: Ambulatory Visit | Attending: Family Medicine | Admitting: Family Medicine

## 2016-05-03 VITALS — BP 128/88 | HR 77 | Temp 98.0°F | Resp 16 | Ht 73.0 in | Wt 297.2 lb

## 2016-05-03 DIAGNOSIS — R0789 Other chest pain: Secondary | ICD-10-CM | POA: Diagnosis not present

## 2016-05-03 DIAGNOSIS — D489 Neoplasm of uncertain behavior, unspecified: Secondary | ICD-10-CM | POA: Insufficient documentation

## 2016-05-03 DIAGNOSIS — L918 Other hypertrophic disorders of the skin: Secondary | ICD-10-CM | POA: Diagnosis not present

## 2016-05-03 NOTE — Progress Notes (Signed)
Chief Complaint  Patient presents with  . skin tag removal  . left breast pain  . hernia pain    Subjective: Patient is a 76 y.o. male here for skin lesion removal.  Pt has been walking more often, irritating lesions on thighs. They have been there for many years. No changes in color, size, or shape. He would like them removed.  Pt also has been having several weeks of L sided chest pain, feels like it is in the muscle. No injury or change in activity. He had something similar to this in the past that resolved by going off of his statin.   ROS: Skin: As noted in HPI MSK: As noted in HPI  Family History  Problem Relation Age of Onset  . CVA Mother   . Hypercholesterolemia Mother   . Goiter Mother   . Diabetes Mother     DM  . Hypertension Mother   . CAD Mother   . Cirrhosis Father     due to alcohol  . Cancer Sister     Breast  . Hypercholesterolemia Brother   . Hypertension Brother   . CAD Brother   . Heart attack Son 82   Past Medical History:  Diagnosis Date  . 10 year risk of MI or stroke 7.5% or greater 04/28/2016  . Allergic rhinitis, cause unspecified   . Atrial fibrillation ([place])     Associated with hyperthyroidism/Graves' disease  . BPH (benign prostatic hyperplasia) 10/18/2015  . Calculus of kidney   . Diverticulosis   . ED (erectile dysfunction)   . Essential hypertension, benign   . Hematuria, unspecified   . History of chicken pox   . Lump or mass in breast   . Mixed hyperlipidemia   . Obesity   . Pacemaker    [place], [place] 2010  . Postablative hypothyroidism   . Sleep apnea    Use CPAP machine  . Ureteral stone    Dr. Risa Grill   Allergies  Allergen Reactions  . Lipitor [Atorvastatin] Other (See Comments)    Breast lumps    Current Outpatient Prescriptions:  .  alfuzosin (UROXATRAL) 10 MG 24 hr tablet, Take 10 mg by mouth at bedtime. , Disp: , Rfl:  .  ALPRAZolam (XANAX) 0.5 MG tablet, Take 1 tablet by mouth every morning., Disp: , Rfl:  .   aspirin 81 MG tablet, Take 81 mg by mouth daily., Disp: , Rfl:  .  Flaxseed, Linseed, (FLAXSEED OIL) 1200 MG CAPS, Take 1 capsule by mouth daily., Disp: , Rfl:  .  Garlic 6283 MG CAPS, Take by mouth., Disp: , Rfl:  .  levothyroxine (SYNTHROID, LEVOTHROID) 175 MCG tablet, Take 175 mcg by mouth daily before breakfast. , Disp: , Rfl:  .  Multiple Vitamins-Minerals (CENTRUM SILVER 50+MEN PO), Take 1 tablet by mouth daily., Disp: , Rfl:  .  MYRBETRIQ 50 MG TB24 tablet, Take 1 tablet by mouth every morning. , Disp: , Rfl:  .  NON FORMULARY, C- PAP MACHINE, Disp: , Rfl:  .  OVER THE COUNTER MEDICATION, Amazing Grape-Take 1 capsule by mouth daily., Disp: , Rfl:  .  pravastatin (PRAVACHOL) 40 MG tablet, Take 40 mg by mouth daily., Disp: , Rfl:   Objective: BP 128/88 (BP Location: Right Arm, Cuff Size: Large)   Pulse 77   Temp 98 F (36.7 C) (Oral)   Resp 16   Ht 6\' 1"  (1.854 m)   Wt 297 lb 3.2 oz (134.8 kg)   SpO2  97%   BMI 39.21 kg/m  General: Awake, appears stated age MSK: +TTP over L pectoral muscle over T4 distribution Skin: 2 fleshy and elevated lesions on R medial thigh proximally, no erythema or irreg borders, approx 3 mm in diameter each Lungs: No accessory muscle use Psych: Age appropriate judgment and insight, normal affect and mood  Procedure note; skin lesion removal Informed consent obtained. Areas cleaned with alcohol. Area was anesthetized with 1% lidocaine with epinephrine after being cleaned with alcohol. The skin tags were clamped at the base with a straight hemostat. There were then removed with a 15 blade scalpel. The larger of the 2 lesions were sent to pathology Hemostasis was obtained with electrical cautery. There were no complications noted. The patient tolerated the procedure well.   Assessment and Plan: Neoplasm of uncertain behavior - Plan: Dermatology pathology, PR SHAV SKIN LES < 0.5 CM TRUNK,ARM,LEG  Left-sided chest wall pain  Orders as  above. Likely skin tags. After care instructions and signs of infection verbalized and written down in AVS. Will stop pravastatin for the next 2-4 weeks. If no improvement, to RTC. May try to go back on or do q 2-3 day dosing.  The patient voiced understanding and agreement to the plan.  [place], DO 05/03/16  4:49 PM

## 2016-05-03 NOTE — Progress Notes (Signed)
Pre visit review using our clinic review tool, if applicable. No additional management support is needed unless otherwise documented below in the visit note. 

## 2016-05-03 NOTE — Patient Instructions (Signed)
Do not shower for the rest of the day. When you do wash it, use only soap and water. Do not vigorously scrub. Apply triple antibiotic ointment (like Neosporin) twice daily. Keep the area clean and dry.   Things to look out for: increasing pain not relieved by ibuprofen/acetaminophen, fevers, spreading redness, drainage of pus, or foul odor.  

## 2016-05-29 DIAGNOSIS — J384 Edema of larynx: Secondary | ICD-10-CM | POA: Diagnosis not present

## 2016-05-29 DIAGNOSIS — R490 Dysphonia: Secondary | ICD-10-CM | POA: Diagnosis not present

## 2016-05-29 DIAGNOSIS — Z87891 Personal history of nicotine dependence: Secondary | ICD-10-CM | POA: Diagnosis not present

## 2016-06-09 ENCOUNTER — Telehealth: Payer: Self-pay | Admitting: *Deleted

## 2016-06-09 NOTE — Telephone Encounter (Signed)
Called patient and left message to return call to schedule AWV w/ [facility]. 

## 2016-06-14 NOTE — Telephone Encounter (Signed)
Patient scheduled for 07/24/16 with Hoyle Sauer for AWV

## 2016-06-14 NOTE — Telephone Encounter (Signed)
Patient is schedule with you. FYI only.

## 2016-06-19 ENCOUNTER — Ambulatory Visit (INDEPENDENT_AMBULATORY_CARE_PROVIDER_SITE_OTHER): Payer: BLUE CROSS/BLUE SHIELD | Admitting: Family Medicine

## 2016-06-19 ENCOUNTER — Encounter: Payer: Self-pay | Admitting: Family Medicine

## 2016-06-19 DIAGNOSIS — K439 Ventral hernia without obstruction or gangrene: Secondary | ICD-10-CM

## 2016-06-19 DIAGNOSIS — M791 Myalgia, unspecified site: Secondary | ICD-10-CM

## 2016-06-19 DIAGNOSIS — T466X5A Adverse effect of antihyperlipidemic and antiarteriosclerotic drugs, initial encounter: Secondary | ICD-10-CM

## 2016-06-19 NOTE — Progress Notes (Signed)
Chief Complaint  Patient presents with  . Follow-up    on meds-pt stopped the cholesterol med-b/c breast lumps  . Hernia    Subjective: Patient is a 76 y.o. male here for f/u on statin induced myalgias.  Patient has a history of statin intolerance due to myalgias. He is currently on pravastatin, which he was tolerating for a while, but was having tenderness in both of his breast muscles. Once he stopped the statin, the pain went away. We did try to do every other day dosing, but the myalgias returned. He has been without the medicine for a week and his pain has resolved.  Obesity The patient is always had an issue with his weight. Admittedly, his diet is not the best. He knows what he needs to do to lose weight, his goal is less than 250 (around a 50 pound weight loss) pounds over the next year. He cannot bend over all the way to tie her shoes and does get short of breath when he bends over due to the size of his stomach. He has not been exercising routinely.  Hernia The patient has been having pain and a bulge over his bellybutton region. He used to work in [employer] and [employer]. He believes this was acquired during heavy lifting. Many of his colleagues had the same hernia. No fevers, nausea, vomiting, or skin changes.   ROS: Heart: Denies chest pain  Lungs: Denies SOB   Family History  Problem Relation Age of Onset  . CVA Mother   . Hypercholesterolemia Mother   . Goiter Mother   . Diabetes Mother        DM  . Hypertension Mother   . CAD Mother   . Cirrhosis Father        due to alcohol  . Cancer Sister        Breast  . Hypercholesterolemia Brother   . Hypertension Brother   . CAD Brother   . Heart attack Son 12   Past Medical History:  Diagnosis Date  . 10 year risk of MI or stroke 7.5% or greater 04/28/2016  . Allergic rhinitis, cause unspecified   . Atrial fibrillation ([place])     Associated with hyperthyroidism/Graves' disease  . BPH (benign prostatic hyperplasia) 10/18/2015   . Calculus of kidney   . Diverticulosis   . ED (erectile dysfunction)   . Essential hypertension, benign   . Hematuria, unspecified   . History of chicken pox   . Lump or mass in breast   . Mixed hyperlipidemia   . Obesity   . Pacemaker    [place], [place] 2010  . Postablative hypothyroidism   . Sleep apnea    Use CPAP machine  . Ureteral stone    Dr. Risa Grill   Allergies  Allergen Reactions  . Lipitor [Atorvastatin] Other (See Comments)    Breast lumps    Current Outpatient Prescriptions:  .  alfuzosin (UROXATRAL) 10 MG 24 hr tablet, Take 10 mg by mouth at bedtime. , Disp: , Rfl:  .  ALPRAZolam (XANAX) 0.5 MG tablet, Take 1 tablet by mouth every morning., Disp: , Rfl:  .  aspirin 81 MG tablet, Take 81 mg by mouth daily., Disp: , Rfl:  .  Flaxseed, Linseed, (FLAXSEED OIL) 1200 MG CAPS, Take 1 capsule by mouth daily., Disp: , Rfl:  .  Garlic 2542 MG CAPS, Take by mouth., Disp: , Rfl:  .  levothyroxine (SYNTHROID, LEVOTHROID) 175 MCG tablet, Take 175 mcg by mouth daily  before breakfast. , Disp: , Rfl:  .  Multiple Vitamins-Minerals (CENTRUM SILVER 50+MEN PO), Take 1 tablet by mouth daily., Disp: , Rfl:  .  MYRBETRIQ 50 MG TB24 tablet, Take 1 tablet by mouth every morning. , Disp: , Rfl:  .  NON FORMULARY, C- PAP MACHINE, Disp: , Rfl:  .  OVER THE COUNTER MEDICATION, Amazing Grape-Take 1 capsule by mouth daily., Disp: , Rfl:  .  pravastatin (PRAVACHOL) 40 MG tablet, Take 1 tab every 3 days., Disp: 30 tablet, Rfl:   Objective: BP (!) 146/77 (BP Location: Left Arm, Patient Position: Sitting, Cuff Size: Large)   Pulse 65   Temp 97.8 F (36.6 C) (Oral)   Ht 6\' 1"  (1.854 m)   Wt (!) 303 lb 12.8 oz (137.8 kg)   SpO2 97%   BMI 40.08 kg/m  General: Awake, appears stated age Heart: RRR Lungs: No accessory muscle use Abd: Reducible umbilical hernia, no skin changes or TTP Psych: Age appropriate judgment and insight, normal affect and mood  Assessment and Plan: Morbid obesity  ([place])  Ventral hernia without obstruction or gangrene - Plan: Ambulatory referral to General Surgery, CANCELED: Ambulatory referral to General Surgery  Myalgia due to statin - Plan: pravastatin (PRAVACHOL) 40 MG tablet  Orders as above. Pravastatin 1x/3 days.  Counseled on diet and exercise. Healthy diet plan given. Briefly brought up right elbow pain, recommended BandIT forearm brace and follow-up in one month. We'll recheck myalgias, elbow pain, and weight loss progress. The patient voiced understanding and agreement to the plan.  Greater than 25 minutes were spent face to face with the patient with greater than 50% of this time spent counseling on diet, exercise, hernias, dietitians, nonmedical weight loss physicians, statin induced myopathy, stroke/MI risk reduction with statin therapy, and follow up.    [place], DO 06/19/16  1:30 PM

## 2016-06-19 NOTE — Patient Instructions (Addendum)
Take your pravastatin once every 3 days.   If you do not hear anything about your referral in the next 1-2 weeks, call our office and ask for an update.  Band-It forearm brace for your elbow.  Healthy Eating Plan Many factors influence your heart health, including eating and exercise habits. Heart (coronary) risk increases with abnormal blood fat (lipid) levels. Heart-healthy meal planning includes limiting unhealthy fats, increasing healthy fats, and making other small dietary changes. This includes maintaining a healthy body weight to help keep lipid levels within a normal range.  WHAT IS MY PLAN?  Your health care provider recommends that you:  Drink a glass of water before meals to help with satiety.  Eat slowly.  An alternative to the water is to add Metamucil. This will help with satiety as well. It does contain calories, unlike water.  WHAT TYPES OF FAT SHOULD I CHOOSE?  Choose healthy fats more often. Choose monounsaturated and polyunsaturated fats, such as olive oil and canola oil, flaxseeds, walnuts, almonds, and seeds.  Eat more omega-3 fats. Good choices include salmon, mackerel, sardines, tuna, flaxseed oil, and ground flaxseeds. Aim to eat fish at least two times each week.  Avoid foods with partially hydrogenated oils in them. These contain trans fats. Examples of foods that contain trans fats are stick margarine, some tub margarines, cookies, crackers, and other baked goods. If you are going to avoid a fat, this is the one to avoid!  WHAT GENERAL GUIDELINES DO I NEED TO FOLLOW?  Check food labels carefully to identify foods with trans fats. Avoid these types of options when possible.  Fill one half of your plate with vegetables and green salads. Eat 4-5 servings of vegetables per day. A serving of vegetables equals 1 cup of raw leafy vegetables,  cup of raw or cooked cut-up vegetables, or  cup of vegetable juice.  Fill one fourth of your plate with whole grains. Look  for the word "whole" as the first word in the ingredient list.  Fill one fourth of your plate with lean protein foods.  Eat 4-5 servings of fruit per day. A serving of fruit equals one medium whole fruit,  cup of dried fruit,  cup of fresh, frozen, or canned fruit. Try to avoid fruits in cups/syrups as the sugar content can be high.  Eat more foods that contain soluble fiber. Examples of foods that contain this type of fiber are apples, broccoli, carrots, beans, peas, and barley. Aim to get 20-30 g of fiber per day.  Eat more home-cooked food and less restaurant, buffet, and fast food.  Limit or avoid alcohol.  Limit foods that are high in starch and sugar.  Avoid fried foods when able.  Cook foods by using methods other than frying. Baking, boiling, grilling, and broiling are all great options. Other fat-reducing suggestions include: ? Removing the skin from poultry. ? Removing all visible fats from meats. ? Skimming the fat off of stews, soups, and gravies before serving them. ? Steaming vegetables in water or broth.  Lose weight if you are overweight. Losing just 5-10% of your initial body weight can help your overall health and prevent diseases such as diabetes and heart disease.  Increase your consumption of nuts, legumes, and seeds to 4-5 servings per week. One serving of dried beans or legumes equals  cup after being cooked, one serving of nuts equals 1 ounces, and one serving of seeds equals  ounce or 1 tablespoon.  WHAT ARE GOOD FOODS  CAN I EAT? Grains Grainy breads (try to find bread that is 3 g of fiber per slice or greater), oatmeal, light popcorn. Whole-grain cereals. Rice and pasta, including brown rice and those that are made with whole wheat. Edamame pasta is a great alternative to grain pasta. It has a higher protein content. Try to avoid significant consumption of white bread, sugary cereals, or pastries/baked goods.  Vegetables All vegetables. Cooked white  potatoes do not count as vegetables.  Fruits All fruits, but limit pineapple and bananas as these fruits have a higher sugar content.  Meats and Other Protein Sources Lean, well-trimmed beef, veal, pork, and lamb. Chicken and [place] without skin. All fish and shellfish. Wild duck, rabbit, pheasant, and venison. Egg whites or low-cholesterol egg substitutes. Dried beans, peas, lentils, and tofu.Seeds and most nuts.  Dairy Low-fat or nonfat cheeses, including ricotta, string, and mozzarella. Skim or 1% milk that is liquid, powdered, or evaporated. Buttermilk that is made with low-fat milk. Nonfat or low-fat yogurt. Soy/Almond milk are good alternatives if you cannot handle dairy.  Beverages Water is the best for you. Sports drinks with less sugar are more desirable unless you are a highly active athlete.  Sweets and Desserts Sherbets and fruit ices. Honey, jam, marmalade, jelly, and syrups. Dark chocolate.  Eat all sweets and desserts in moderation.  Fats and Oils Nonhydrogenated (trans-free) margarines. Vegetable oils, including soybean, sesame, sunflower, olive, peanut, safflower, corn, canola, and cottonseed. Salad dressings or mayonnaise that are made with a vegetable oil. Limit added fats and oils that you use for cooking, baking, salads, and as spreads.  Other Cocoa powder. Coffee and tea. Most condiments.  The items listed above may not be a complete list of recommended foods or beverages. Contact your dietitian for more options.

## 2016-06-27 ENCOUNTER — Ambulatory Visit: Payer: BLUE CROSS/BLUE SHIELD | Admitting: Interventional Cardiology

## 2016-06-29 ENCOUNTER — Ambulatory Visit (INDEPENDENT_AMBULATORY_CARE_PROVIDER_SITE_OTHER): Payer: BLUE CROSS/BLUE SHIELD | Admitting: *Deleted

## 2016-06-29 DIAGNOSIS — I495 Sick sinus syndrome: Secondary | ICD-10-CM

## 2016-06-29 LAB — CUP PACEART REMOTE DEVICE CHECK
Battery Remaining Longevity: 55 mo
Battery Remaining Percentage: 46 %
Battery Voltage: 2.86 V
Brady Statistic AP VP Percent: 1 %
Brady Statistic AS VS Percent: 70 %
Brady Statistic RA Percent Paced: 29 %
Date Time Interrogation Session: 20180614061606
Implantable Lead Implant Date: 20101111
Implantable Lead Implant Date: 20101111
Implantable Lead Location: 753859
Implantable Pulse Generator Implant Date: 20101111
Lead Channel Impedance Value: 410 Ohm
Lead Channel Pacing Threshold Amplitude: 0.75 V
Lead Channel Pacing Threshold Pulse Width: 0.4 ms
Lead Channel Pacing Threshold Pulse Width: 0.4 ms
Lead Channel Sensing Intrinsic Amplitude: 4.2 mV
Lead Channel Setting Pacing Amplitude: 2.5 V
Lead Channel Setting Sensing Sensitivity: 2 mV
MDC IDC LEAD LOCATION: 753860
MDC IDC MSMT LEADCHNL RA IMPEDANCE VALUE: 350 Ohm
MDC IDC MSMT LEADCHNL RV PACING THRESHOLD AMPLITUDE: 1 V
MDC IDC MSMT LEADCHNL RV SENSING INTR AMPL: 8.7 mV
MDC IDC SET LEADCHNL RA PACING AMPLITUDE: 2 V
MDC IDC SET LEADCHNL RV PACING PULSEWIDTH: 0.4 ms
MDC IDC STAT BRADY AP VS PERCENT: 29 %
MDC IDC STAT BRADY AS VP PERCENT: 1 %
MDC IDC STAT BRADY RV PERCENT PACED: 1 %
Pulse Gen Serial Number: 7079515

## 2016-06-29 NOTE — Progress Notes (Signed)
Remote pacemaker transmission.   

## 2016-06-30 ENCOUNTER — Encounter: Payer: Self-pay | Admitting: Cardiology

## 2016-07-11 DIAGNOSIS — R351 Nocturia: Secondary | ICD-10-CM | POA: Diagnosis not present

## 2016-07-11 DIAGNOSIS — N3281 Overactive bladder: Secondary | ICD-10-CM | POA: Diagnosis not present

## 2016-07-11 DIAGNOSIS — N401 Enlarged prostate with lower urinary tract symptoms: Secondary | ICD-10-CM | POA: Diagnosis not present

## 2016-07-11 DIAGNOSIS — N5201 Erectile dysfunction due to arterial insufficiency: Secondary | ICD-10-CM | POA: Diagnosis not present

## 2016-07-14 ENCOUNTER — Encounter: Payer: Self-pay | Admitting: Family Medicine

## 2016-07-14 ENCOUNTER — Ambulatory Visit (INDEPENDENT_AMBULATORY_CARE_PROVIDER_SITE_OTHER): Payer: BLUE CROSS/BLUE SHIELD | Admitting: Family Medicine

## 2016-07-14 ENCOUNTER — Other Ambulatory Visit: Payer: Self-pay | Admitting: Family Medicine

## 2016-07-14 ENCOUNTER — Ambulatory Visit (HOSPITAL_BASED_OUTPATIENT_CLINIC_OR_DEPARTMENT_OTHER)
Admission: RE | Admit: 2016-07-14 | Discharge: 2016-07-14 | Disposition: A | Discharge status: Home / Self Care | Payer: BLUE CROSS/BLUE SHIELD | Source: Ambulatory Visit | Attending: Family Medicine | Admitting: Family Medicine

## 2016-07-14 VITALS — BP 126/60 | HR 77 | Temp 98.3°F | Ht 73.0 in | Wt 296.4 lb

## 2016-07-14 DIAGNOSIS — M25531 Pain in right wrist: Secondary | ICD-10-CM | POA: Diagnosis not present

## 2016-07-14 DIAGNOSIS — E039 Hypothyroidism, unspecified: Secondary | ICD-10-CM | POA: Diagnosis not present

## 2016-07-14 DIAGNOSIS — M79641 Pain in right hand: Secondary | ICD-10-CM | POA: Diagnosis not present

## 2016-07-14 DIAGNOSIS — M899 Disorder of bone, unspecified: Secondary | ICD-10-CM

## 2016-07-14 DIAGNOSIS — R635 Abnormal weight gain: Secondary | ICD-10-CM

## 2016-07-14 DIAGNOSIS — M85641 Other cyst of bone, right hand: Secondary | ICD-10-CM | POA: Diagnosis not present

## 2016-07-14 DIAGNOSIS — S6991XA Unspecified injury of right wrist, hand and finger(s), initial encounter: Secondary | ICD-10-CM | POA: Diagnosis not present

## 2016-07-14 LAB — T4, FREE: Free T4: 0.9 ng/dL (ref 0.60–1.60)

## 2016-07-14 LAB — TSH: TSH: 6.48 u[IU]/mL — AB (ref 0.35–4.50)

## 2016-07-14 NOTE — Addendum Note (Signed)
Addended by: Harl Bowie on: 07/14/2016 01:12 PM   Modules accepted: Orders

## 2016-07-14 NOTE — Progress Notes (Signed)
Musculoskeletal Exam  Patient: Arthur Daniel DOB: 09-Mar-1940  DOS: 07/14/2016  SUBJECTIVE:  Chief Complaint:   Chief Complaint  Patient presents with  . Fall    on yest and hit (R) hand-pain a mainly in the wrist    [name] is a 76 y.o.  male for evaluation and treatment of R wrist pain.   Onset:  1 day ago. [place] (does not remember what wrist/hand did when it struck ground) and landed on R hand  Location: dorsal R wrist Character:  sharp  Progression of issue:  is unchanged Associated symptoms: Decreased ROM, some swelling Treatment: to date has been ice.   Neurovascular symptoms: no  He has also been having weight gain and wonders if his thyroid is in order. Last check was >6 mo ago. He is taking it as instructed.  ROS: Musculoskeletal/Extremities: +R wrist pain Neurologic: no numbness, tingling no weakness   Past Medical History:  Diagnosis Date  . 10 year risk of MI or stroke 7.5% or greater 04/28/2016  . Allergic rhinitis, cause unspecified   . Atrial fibrillation ([place])     Associated with hyperthyroidism/Graves' disease  . BPH (benign prostatic hyperplasia) 10/18/2015  . Calculus of kidney   . Diverticulosis   . ED (erectile dysfunction)   . Essential hypertension, benign   . Hematuria, unspecified   . History of chicken pox   . Lump or mass in breast   . Mixed hyperlipidemia   . Obesity   . Pacemaker    [place], [place] 2010  . Postablative hypothyroidism   . Sleep apnea    Use CPAP machine  . Ureteral stone    Dr. Risa Grill   Past Surgical History:  Procedure Laterality Date  . ANTERIOR CRUCIATE LIGAMENT REPAIR Right 1994  . CATARACT EXTRACTION, BILATERAL  08/2010  . COLONOSCOPY  2001, 2008  . FLEXIBLE SIGMOIDOSCOPY  2001  . FOOT SURGERY Left 2005   Fot repair after chainsaw accident  . MENISCUS REPAIR Left 2004  . OTHER SURGICAL HISTORY  1995   Ear surgery due to mastoiditis  . PACEMAKER INSERTION  11/26/08   [name]  . SHOULDER ARTHROSCOPY  WITH SUBACROMIAL DECOMPRESSION Left 10/20/2013   Procedure: LEFT SHOULDER SCOPE/DISTAL ACROMIOPLASTY, LABRAL DEBRIDMENT;  Surgeon: [name], MD;  Location: [place];  Service: Orthopedics;  Laterality: Left;  . SHOULDER SURGERY Right 11/2002  . SHOULDER SURGERY Right 2011  . THYROGLOSSAL DUCT CYST    . TONSILLECTOMY AND ADENOIDECTOMY     Family History  Problem Relation Age of Onset  . CVA Mother   . Hypercholesterolemia Mother   . Goiter Mother   . Diabetes Mother        DM  . Hypertension Mother   . CAD Mother   . Cirrhosis Father        due to alcohol  . Cancer Sister        Breast  . Hypercholesterolemia Brother   . Hypertension Brother   . CAD Brother   . Heart attack Son 91   Current Outpatient Prescriptions  Medication Sig Dispense Refill  . alfuzosin (UROXATRAL) 10 MG 24 hr tablet Take 10 mg by mouth at bedtime.     . ALPRAZolam (XANAX) 0.5 MG tablet Take 1 tablet by mouth every morning.    Marland Kitchen aspirin 81 MG tablet Take 81 mg by mouth daily.    . Flaxseed, Linseed, (FLAXSEED OIL) 1200 MG CAPS Take 1 capsule by mouth daily.    Marland Kitchen  Garlic 3338 MG CAPS Take by mouth.    . levothyroxine (SYNTHROID, LEVOTHROID) 175 MCG tablet Take 175 mcg by mouth daily before breakfast.     . Multiple Vitamins-Minerals (CENTRUM SILVER 50+MEN PO) Take 1 tablet by mouth daily.    Marland Kitchen MYRBETRIQ 50 MG TB24 tablet Take 1 tablet by mouth every morning.     . NON FORMULARY C- PAP MACHINE    . OVER THE COUNTER MEDICATION Amazing Grape-Take 1 capsule by mouth daily.    . pravastatin (PRAVACHOL) 40 MG tablet Take 1 tab every 3 days. 30 tablet    Allergies  Allergen Reactions  . Lipitor [Atorvastatin] Other (See Comments)    Breast lumps   Social History   Social History  . Marital status: Married   Social History Main Topics  . Smoking status: Former Smoker    Packs/day: 1.00    Years: 55.00    Types: Cigarettes    Quit date: 01/16/2006  . Smokeless tobacco: Former Systems developer     Types: [place] date: 09/16/2013     Comment: 1/2-1 pack a day  . Alcohol use Yes     Comment: rarely drinks wine    Objective: VITAL SIGNS: BP 126/60 (BP Location: Left Arm, Patient Position: Sitting, Cuff Size: Large)   Pulse 77   Temp 98.3 F (36.8 C) (Oral)   Ht 6\' 1"  (1.854 m)   Wt 296 lb 6.4 oz (134.4 kg)   SpO2 94%   BMI 39.11 kg/m  Constitutional: Well formed, well developed. No acute distress. Cardiovascular: Brisk cap refill Thorax & Lungs: No accessory muscle use Extremities: No clubbing. No cyanosis.  Skin: Warm. Dry. No erythema. No rash.  Musculoskeletal: R wrist.   Normal active range of motion: no.  Decreased flexion and extension. Normal passive range of motion: no, decreased flexion and extension Tenderness to palpation: yes over dorsal carpal row near triquetrum and capitate.  Mild edema of wrist compared to L. Deformity: no Ecchymosis: no Neurologic: Normal sensory function.  Psychiatric: Normal mood. Age appropriate judgment and insight. Alert & oriented x 3.    Assessment:  Right wrist pain  Hand pain, right - Plan: DG Hand Complete Right  Weight gain - Plan: TSH, T4, free  Hypothyroidism, unspecified type - Plan: TSH, T4, free  Plan: Orders as above. XR possibly shows a displaced chip of a bone, will await final read. If neg, will not call. If positive, will refer to hand/ortho.  Ice. Tylenol. Offered stronger pain medicine but he declined. Wrist splint given. Wear at night and when in public.  F/u prn.  The patient voiced understanding and agreement to the plan.   [place], DO 07/14/16  12:17 PM

## 2016-07-14 NOTE — Progress Notes (Signed)
Discussed results of XR with patient on phone. Bone scan ordered per suggestion of reading radiologist. Pt agreed.

## 2016-07-14 NOTE — Patient Instructions (Addendum)
Where the wrist splint at night and when in public.  Ice/cold pack over area for 10-15 min every 2-3 hours while awake.  OK to take Tylenol 1000 mg (2 extra strength tabs) or 975 mg (3 regular strength tabs) every 6 hours as needed.  We will let you know when the radiologist reads the X-ray and it is broken. If it is broken, we will refer you to a hand specialist. If it is normal, we will not reach out to you.

## 2016-07-17 DIAGNOSIS — K429 Umbilical hernia without obstruction or gangrene: Secondary | ICD-10-CM | POA: Diagnosis not present

## 2016-07-20 NOTE — Progress Notes (Addendum)
Subjective:   Arthur Daniel is a 76 y.o. male who presents for an Initial Medicare Annual Wellness Visit.  The Patient was informed that the wellness visit is to identify future health risk and educate and initiate measures that can reduce risk for increased disease through the lifespan.   Describes health as fair, good or great? " good for an old man"   Review of Systems  No ROS.  Medicare Wellness Visit. Additional risk factors are reflected in the social history.  Cardiac Risk Factors include: advanced age (>59men, >20 women);hypertension;male gender;obesity (BMI >30kg/m2);dyslipidemia;sedentary lifestyle Sleep patterns:   Wears CPAP. Sleeps 6-7 hrs per night. Occasionally wakes once to urinate. Visits urology every year. Home Safety/Smoke Alarms: Feels safe in home. Smoke alarms in place.  Living environment; residence and Firearm Safety: Lives with wife. No issues with stairs. Guns safely stored. Seat Belt Safety/Bike Helmet: Wears seat belt.   Counseling:   Eye Exam- hx cataract sx. [facility] yearly.  Dental- yearly.  Male:   [facility]-  PATIENT REPORTED... 01/17/12-polyps removed   PSA-  Lab Results  Component Value Date   PSA 0.25 10/18/2015      Objective:    Today's Vitals   07/24/16 1039  BP: 130/70  Pulse: 69  Temp: 98 F (36.7 C)  TempSrc: Oral  SpO2: 95%  Weight: (!) 301 lb 6.4 oz (136.7 kg)  Height: 6\' 1"  (1.854 m)   Body mass index is 39.76 kg/m.  Current Medications (verified) Outpatient Encounter Prescriptions as of 07/24/2016  Medication Sig  . alfuzosin (UROXATRAL) 10 MG 24 hr tablet Take 10 mg by mouth at bedtime.   . ALPRAZolam (XANAX) 0.5 MG tablet Take 1 tablet by mouth every morning.  Marland Kitchen aspirin 81 MG tablet Take 81 mg by mouth daily.  . Flaxseed, Linseed, (FLAXSEED OIL) 1200 MG CAPS Take 1 capsule by mouth daily.  . Garlic 9379 MG CAPS Take by mouth.  . levothyroxine (SYNTHROID, LEVOTHROID) 175 MCG tablet Take 175 mcg by mouth daily  before breakfast.   . Multiple Vitamins-Minerals (CENTRUM SILVER 50+MEN PO) Take 1 tablet by mouth daily.  Marland Kitchen MYRBETRIQ 50 MG TB24 tablet Take 1 tablet by mouth every morning.   . NON FORMULARY C- PAP MACHINE  . OVER THE COUNTER MEDICATION Amazing Grape-Take 1 capsule by mouth daily.  . pravastatin (PRAVACHOL) 40 MG tablet Take 1 tab every 3 days.   Allergies (verified) Lipitor [atorvastatin]   History: Past Medical History:  Diagnosis Date  . 10 year risk of MI or stroke 7.5% or greater 04/28/2016  . Allergic rhinitis, cause unspecified   . Atrial fibrillation ([place])     Associated with hyperthyroidism/Graves' disease  . BPH (benign prostatic hyperplasia) 10/18/2015  . Calculus of kidney   . Diverticulosis   . ED (erectile dysfunction)   . Essential hypertension, benign   . Hematuria, unspecified   . Hernia, umbilical   . History of chicken pox   . Lump or mass in breast   . Mixed hyperlipidemia   . Obesity   . Pacemaker    [place], [place] 2010  . Postablative hypothyroidism   . Sleep apnea    Use CPAP machine  . Ureteral stone    Dr. Risa Grill   Past Surgical History:  Procedure Laterality Date  . ANTERIOR CRUCIATE LIGAMENT REPAIR Right 1994  . CATARACT EXTRACTION, BILATERAL  08/2010  . COLONOSCOPY  2001, 2008  . FLEXIBLE SIGMOIDOSCOPY  2001  . FOOT SURGERY Left  2005   Fot repair after chainsaw accident  . MENISCUS REPAIR Left 2004  . OTHER SURGICAL HISTORY  1995   Ear surgery due to mastoiditis  . PACEMAKER INSERTION  11/26/08   [name]  . SHOULDER ARTHROSCOPY WITH SUBACROMIAL DECOMPRESSION Left 10/20/2013   Procedure: LEFT SHOULDER SCOPE/DISTAL ACROMIOPLASTY, LABRAL DEBRIDMENT;  Surgeon: [name], MD;  Location: [place];  Service: Orthopedics;  Laterality: Left;  . SHOULDER SURGERY Right 11/2002  . SHOULDER SURGERY Right 2011  . THYROGLOSSAL DUCT CYST    . TONSILLECTOMY AND ADENOIDECTOMY     Family History  Problem Relation Age of Onset  . CVA  Mother   . Hypercholesterolemia Mother   . Goiter Mother   . Diabetes Mother        DM  . Hypertension Mother   . CAD Mother   . Cirrhosis Father        due to alcohol  . Cancer Sister        Breast  . Hypercholesterolemia Brother   . Hypertension Brother   . CAD Brother   . Heart attack Son 64   Social History   Social History Main Topics  . Smoking status: Former Smoker    Packs/day: 1.00    Years: 55.00    Types: Cigarettes    Quit date: 01/16/2006  . Smokeless tobacco: Former Systems developer    Types: [place] date: 09/16/2013     Comment: 1/2-1 pack a day  . Alcohol use Yes     Comment: rarely drinks wine  . Drug use: No  . Sexual activity: Yes   Tobacco Counseling Counseling given: Not Answered   Activities of Daily Living In your present state of health, do you have any difficulty performing the following activities: 07/24/2016 09/17/2015  Hearing? N Y  Vision? N N  Difficulty concentrating or making decisions? N N  Walking or climbing stairs? N N  Dressing or bathing? N N  Doing errands, shopping? N N  Preparing Food and eating ? N -  Using the Toilet? N -  In the past six months, have you accidently leaked urine? N -  Do you have problems with loss of bowel control? N -  Managing your Medications? N -  Managing your Finances? N -  Housekeeping or managing your Housekeeping? N -  Some recent data might be hidden    Immunizations and Health Maintenance Immunization History  Administered Date(s) Administered  . Influenza,inj,Quad PF,36+ Mos 10/18/2015   There are no preventive care reminders to display for this patient.  Patient Care Team: Shelda Pal, DO as PCP - General (Family Medicine) Hollar, Katharine Look, MD as Referring Physician (Dermatology) Sharol Given, MD as Referring Physician (Otolaryngology) Alyson Ingles Candee Furbish, MD as Consulting Physician (Urology) Frederik Pear, MD as Consulting Physician (Orthopedic Surgery) Charlyn Minerva  (Internal Medicine) Deboraha Sprang, MD as Consulting Physician (Cardiology)  Indicate any recent Medical Services you may have received from other than [facility] providers in the past year (date may be approximate).    Assessment:   This is a routine wellness examination for Arthur Daniel. Physical assessment deferred to PCP.   Hearing/Vision screen  Visual Acuity Screening   Right eye Left eye Both eyes  Without correction: 20/25 20/25 20/25   With correction:     Hearing Screening Comments: Wears hearing aids bilateral.  Dietary issues and exercise activities discussed: Current Exercise Habits: The patient does not participate in regular exercise at  present Diet (meal preparation, eat out, water intake, caffeinated beverages, dairy products, fruits and vegetables): Pt states his diet is healthy .       Goals      Patient Stated   . patient is going to start walking on treadmill 3x/week for 84min each time (pt-stated)      Depression Screen PHQ 2/9 Scores 07/24/2016 09/17/2015  PHQ - 2 Score 0 3  PHQ- 9 Score - 6    Fall Risk Fall Risk  07/24/2016 09/17/2015  Falls in the past year? Yes No  Number falls in past yr: 1 -  Injury with Fall? No -  Follow up Education provided;Falls prevention discussed -    Cognitive Function: Ad8 score reviewed for issues:  Issues making decisions:no  Less interest in hobbies / activities:no  Repeats questions, stories (family complaining):no  Trouble using ordinary gadgets (microwave, computer, phone):no  Forgets the month or year: no  Mismanaging finances: no  Remembering appts:no  Daily problems with thinking and/or memory:no Ad8 score is=no            Screening Tests Health Maintenance  Topic Date Due  . TETANUS/TDAP  11/28/2016 (Originally 12/23/1959)  . PNA vac Low Risk Adult (1 of 2 - PCV13) 11/28/2016 (Originally 12/24/2007)  . INFLUENZA VACCINE  08/16/2016  . COLONOSCOPY  01/16/2022        Plan:   Follow up with PCP as  directed.  Continue to eat heart healthy diet (full of fruits, vegetables, whole grains, lean protein, water--limit salt, fat, and sugar intake) and increase physical activity as tolerated.   Continue doing brain stimulating activities (puzzles, reading, adult coloring books, staying active) to keep memory sharp.    I have personally reviewed and noted the following in the patient's chart:   . Medical and social history . Use of alcohol, tobacco or illicit drugs  . Current medications and supplements . Functional ability and status . Nutritional status . Physical activity . Advanced directives . List of other physicians . Hospitalizations, surgeries, and ER visits in previous 12 months . Vitals . Screenings to include cognitive, depression, and falls . Referrals and appointments  In addition, I have reviewed and discussed with patient certain preventive protocols, quality metrics, and best practice recommendations. A written personalized care plan for preventive services as well as general preventive health recommendations were provided to patient.     Shela Nevin, [place]   07/24/2016    Noted. Agree with above.  Crosby Oyster [place], DO 1:19 PM 07/24/16

## 2016-07-24 ENCOUNTER — Ambulatory Visit: Payer: BLUE CROSS/BLUE SHIELD | Admitting: *Deleted

## 2016-07-24 ENCOUNTER — Ambulatory Visit (INDEPENDENT_AMBULATORY_CARE_PROVIDER_SITE_OTHER): Payer: BLUE CROSS/BLUE SHIELD | Admitting: Family Medicine

## 2016-07-24 ENCOUNTER — Encounter: Payer: Self-pay | Admitting: Family Medicine

## 2016-07-24 VITALS — BP 130/70 | HR 69 | Temp 98.0°F | Ht 73.0 in | Wt 301.4 lb

## 2016-07-24 DIAGNOSIS — G720 Drug-induced myopathy: Secondary | ICD-10-CM

## 2016-07-24 DIAGNOSIS — T466X5A Adverse effect of antihyperlipidemic and antiarteriosclerotic drugs, initial encounter: Secondary | ICD-10-CM | POA: Diagnosis not present

## 2016-07-24 DIAGNOSIS — Z0000 Encounter for general adult medical examination without abnormal findings: Secondary | ICD-10-CM

## 2016-07-24 DIAGNOSIS — M25531 Pain in right wrist: Secondary | ICD-10-CM | POA: Diagnosis not present

## 2016-07-24 NOTE — Progress Notes (Signed)
Chief Complaint  Patient presents with  . Follow-up    on (R) wrist-pt states doing better  . Medicare Wellness    with RN    Subjective: Patient is a 76 y.o. male here for f/u statin intolerance.  The patient is here for follow-up wrist pain. He feels much improved since I saw him last. He came out of the brace 2 days ago and feels normal since then. He has been doing normal chores and activities at home without issue. No swelling. His range of motion is good.  He is also following up on his statin intolerance. He would get muscle aches in his chest. He had bad muscle aches with Lipitor. He got some with pravastatin, however has been taking it every 3 and 2 days alternating. He is not having muscle aches with this. If he takes it every 2 days, he will have muscle aches.   ROS: MSK: Denies muscle aches  Family History  Problem Relation Age of Onset  . CVA Mother   . Hypercholesterolemia Mother   . Goiter Mother   . Diabetes Mother        DM  . Hypertension Mother   . CAD Mother   . Cirrhosis Father        due to alcohol  . Cancer Sister        Breast  . Hypercholesterolemia Brother   . Hypertension Brother   . CAD Brother   . Heart attack Son 36   Past Medical History:  Diagnosis Date  . 10 year risk of MI or stroke 7.5% or greater 04/28/2016  . Allergic rhinitis, cause unspecified   . Atrial fibrillation ([place])     Associated with hyperthyroidism/Graves' disease  . BPH (benign prostatic hyperplasia) 10/18/2015  . Calculus of kidney   . Diverticulosis   . ED (erectile dysfunction)   . Essential hypertension, benign   . Hematuria, unspecified   . Hernia, umbilical   . History of chicken pox   . Lump or mass in breast   . Mixed hyperlipidemia   . Obesity   . Pacemaker    [place], [place] 2010  . Postablative hypothyroidism   . Sleep apnea    Use CPAP machine  . Ureteral stone    Dr. Risa Grill   Allergies  Allergen Reactions  . Lipitor [Atorvastatin] Other (See  Comments)    Breast lumps    Current Outpatient Prescriptions:  .  alfuzosin (UROXATRAL) 10 MG 24 hr tablet, Take 10 mg by mouth at bedtime. , Disp: , Rfl:  .  ALPRAZolam (XANAX) 0.5 MG tablet, Take 1 tablet by mouth every morning., Disp: , Rfl:  .  aspirin 81 MG tablet, Take 81 mg by mouth daily., Disp: , Rfl:  .  Flaxseed, Linseed, (FLAXSEED OIL) 1200 MG CAPS, Take 1 capsule by mouth daily., Disp: , Rfl:  .  Garlic 8921 MG CAPS, Take by mouth., Disp: , Rfl:  .  levothyroxine (SYNTHROID, LEVOTHROID) 175 MCG tablet, Take 175 mcg by mouth daily before breakfast. , Disp: , Rfl:  .  Multiple Vitamins-Minerals (CENTRUM SILVER 50+MEN PO), Take 1 tablet by mouth daily., Disp: , Rfl:  .  MYRBETRIQ 50 MG TB24 tablet, Take 1 tablet by mouth every morning. , Disp: , Rfl:  .  NON FORMULARY, C- PAP MACHINE, Disp: , Rfl:  .  OVER THE COUNTER MEDICATION, Amazing Grape-Take 1 capsule by mouth daily., Disp: , Rfl:  .  pravastatin (PRAVACHOL) 40 MG tablet, Take  1 tab every 3 days., Disp: 30 tablet, Rfl:   Objective: BP 130/70 (BP Location: Left Arm, Patient Position: Sitting, Cuff Size: Large)   Pulse 69   Temp 98 F (36.7 C) (Oral)   Ht 6\' 1"  (1.854 m)   Wt (!) 301 lb 6.4 oz (136.7 kg)   SpO2 95%   BMI 39.76 kg/m  General: Awake, appears stated age Lungs: No accessory muscle use MSK: No edema of R wrist, no TTP, good ROM Psych: Age appropriate judgment and insight, normal affect and mood  Assessment and Plan: Right wrist pain  Statin myopathy  Encounter for Medicare annual wellness exam  Okay to come out of the brace. Use as needed. Continue pravastatin every 3 and every 2 days. Follow-up in 6 weeks for a weight check. We will discuss intermittent fasting at that visit. The patient voiced understanding and agreement to the plan.  [place], DO 07/24/16  12:19 PM

## 2016-07-24 NOTE — Patient Instructions (Addendum)
[name] , Thank you for taking time to come for your Medicare Wellness Visit. I appreciate your ongoing commitment to your health goals. Please review the following plan we discussed and let me know if I can assist you in the future.   These are the goals we discussed: Goals      Patient Stated   . patient is going to start walking on treadmill 3x/week for 44min each time (pt-stated)       This is a list of the screening recommended for you and due dates:  Health Maintenance  Topic Date Due  . Tetanus Vaccine  11/28/2016*  . Pneumonia vaccines (1 of 2 - PCV13) 11/28/2016*  . Flu Shot  08/16/2016  . Colon Cancer Screening  01/16/2022  *Topic was postponed. The date shown is not the original due date.   Continue to eat heart healthy diet (full of fruits, vegetables, whole grains, lean protein, water--limit salt, fat, and sugar intake) and increase physical activity as tolerated.   Continue doing brain stimulating activities (puzzles, reading, adult coloring books, staying active) to keep memory sharp.  Health Maintenance, Male A healthy lifestyle and preventive care is important for your health and wellness. Ask your health care provider about what schedule of regular examinations is right for you. What should I know about weight and diet? Eat a Healthy Diet  Eat plenty of vegetables, fruits, whole grains, low-fat dairy products, and lean protein.  Do not eat a lot of foods high in solid fats, added sugars, or salt.  Maintain a Healthy Weight Regular exercise can help you achieve or maintain a healthy weight. You should:  Do at least 150 minutes of exercise each week. The exercise should increase your heart rate and make you sweat (moderate-intensity exercise).  Do strength-training exercises at least twice a week.  Watch Your Levels of Cholesterol and Blood Lipids  Have your blood tested for lipids and cholesterol every 5 years starting at 74 years of age. If you are at  high risk for heart disease, you should start having your blood tested when you are 74 years old. You may need to have your cholesterol levels checked more often if: ? Your lipid or cholesterol levels are high. ? You are older than 74 years of age. ? You are at high risk for heart disease.  What should I know about cancer screening? Many types of cancers can be detected early and may often be prevented. Lung Cancer  You should be screened every year for lung cancer if: ? You are a current smoker who has smoked for at least 30 years. ? You are a former smoker who has quit within the past 15 years.  Talk to your health care provider about your screening options, when you should start screening, and how often you should be screened.  Colorectal Cancer  Routine colorectal cancer screening usually begins at 74 years of age and should be repeated every 5-10 years until you are 75 years old. You may need to be screened more often if early forms of precancerous polyps or small growths are found. Your health care provider may recommend screening at an earlier age if you have risk factors for colon cancer.  Your health care provider may recommend using home test kits to check for hidden blood in the stool.  A small camera at the end of a tube can be used to examine your colon (sigmoidoscopy or colonoscopy). This checks for the earliest forms of colorectal  cancer.  Prostate and Testicular Cancer  Depending on your age and overall health, your health care provider may do certain tests to screen for prostate and testicular cancer.  Talk to your health care provider about any symptoms or concerns you have about testicular or prostate cancer.  Skin Cancer  Check your skin from head to toe regularly.  Tell your health care provider about any new moles or changes in moles, especially if: ? There is a change in a mole's size, shape, or color. ? You have a mole that is larger than a pencil  eraser.  Always use sunscreen. Apply sunscreen liberally and repeat throughout the day.  Protect yourself by wearing long sleeves, pants, a wide-brimmed hat, and sunglasses when outside.  What should I know about heart disease, diabetes, and high blood pressure?  If you are 45-51 years of age, have your blood pressure checked every 3-5 years. If you are 33 years of age or older, have your blood pressure checked every year. You should have your blood pressure measured twice-once when you are at a hospital or clinic, and once when you are not at a hospital or clinic. Record the average of the two measurements. To check your blood pressure when you are not at a hospital or clinic, you can use: ? An automated blood pressure machine at a pharmacy. ? A home blood pressure monitor.  Talk to your health care provider about your target blood pressure.  If you are between 15-100 years old, ask your health care provider if you should take aspirin to prevent heart disease.  Have regular diabetes screenings by checking your fasting blood sugar level. ? If you are at a normal weight and have a low risk for diabetes, have this test once every three years after the age of 48. ? If you are overweight and have a high risk for diabetes, consider being tested at a younger age or more often.  A one-time screening for abdominal aortic aneurysm (AAA) by ultrasound is recommended for men aged 73-75 years who are current or former smokers. What should I know about preventing infection? Hepatitis B If you have a higher risk for hepatitis B, you should be screened for this virus. Talk with your health care provider to find out if you are at risk for hepatitis B infection. Hepatitis C Blood testing is recommended for:  Everyone born from [birth year] through 1963.  Anyone with known risk factors for hepatitis C.  Sexually Transmitted Diseases (STDs)  You should be screened each year for STDs including gonorrhea and  chlamydia if: ? You are sexually active and are younger than 74 years of age. ? You are older than 74 years of age and your health care provider tells you that you are at risk for this type of infection. ? Your sexual activity has changed since you were last screened and you are at an increased risk for chlamydia or gonorrhea. Ask your health care provider if you are at risk.  Talk with your health care provider about whether you are at high risk of being infected with HIV. Your health care provider may recommend a prescription medicine to help prevent HIV infection.  What else can I do?  Schedule regular health, dental, and eye exams.  Stay current with your vaccines (immunizations).  Do not use any tobacco products, such as cigarettes, chewing tobacco, and e-cigarettes. If you need help quitting, ask your health care provider.  Limit alcohol intake to no more  than 2 drinks per day. One drink equals 12 ounces of beer, 5 ounces of wine, or 1 ounces of hard liquor.  Do not use street drugs.  Do not share needles.  Ask your health care provider for help if you need support or information about quitting drugs.  Tell your health care provider if you often feel depressed.  Tell your health care provider if you have ever been abused or do not feel safe at home. This information is not intended to replace advice given to you by your health care provider. Make sure you discuss any questions you have with your health care provider. Document Released: 07/01/2007 Document Revised: 09/01/2015 Document Reviewed: 10/06/2014 Elsevier Interactive Patient Education  [employer].

## 2016-07-28 ENCOUNTER — Encounter (HOSPITAL_COMMUNITY)
Admission: RE | Admit: 2016-07-28 | Discharge: 2016-07-28 | Disposition: A | Discharge status: Home / Self Care | Payer: BLUE CROSS/BLUE SHIELD | Source: Ambulatory Visit | Attending: Family Medicine | Admitting: Family Medicine

## 2016-07-28 ENCOUNTER — Ambulatory Visit (HOSPITAL_COMMUNITY)
Admission: RE | Admit: 2016-07-28 | Discharge: 2016-07-28 | Disposition: A | Discharge status: Home / Self Care | Payer: BLUE CROSS/BLUE SHIELD | Source: Ambulatory Visit | Attending: Family Medicine | Admitting: Family Medicine

## 2016-07-28 DIAGNOSIS — M170 Bilateral primary osteoarthritis of knee: Secondary | ICD-10-CM | POA: Diagnosis not present

## 2016-07-28 DIAGNOSIS — M899 Disorder of bone, unspecified: Secondary | ICD-10-CM | POA: Insufficient documentation

## 2016-07-28 MED ORDER — TECHNETIUM TC 99M MEDRONATE IV KIT
22.0000 | PACK | Freq: Once | INTRAVENOUS | Status: AC | PRN
  Administered 2016-07-28: 22 via INTRAVENOUS

## 2016-08-09 ENCOUNTER — Ambulatory Visit: Payer: Self-pay | Admitting: General Surgery

## 2016-08-09 NOTE — H&P (Signed)
Arthur Daniel 07/17/2016 11:45 AM Location: [facility] Patient #: [id number] DOB: Sep 29, 1940 Married / Language: English / Race: White Male   History of Present Illness Arthur Hollingshead MD; 07/17/2016 12:17 PM) The patient is a 76 year old male.  Note:He is referred by Dr. Nani Ravens for consultation regarding a symptomatic umbilical hernia. He reports that the hernia is been present for about 30 years. Recently, he lifted a friends children and began developing pain around the hernia area. Now, when he lifts anything over 40 pounds, he gets pain around the umbilical hernia. No obstruction symptoms. He takes medicine for his BPH and does not have to strain to urinate anymore. He denies constipation. He states he has been trying to lose weight by altering his diet but has been unable to do so.  Past Surgical History [name], [place]; 07/17/2016 11:45 AM) Colon Polyp Removal - Colonoscopy  Foot Surgery  Left. Hemorrhoidectomy  Knee Surgery  Bilateral. Oral Surgery  Shoulder Surgery  Bilateral. Tonsillectomy  Vasectomy   Diagnostic Studies History [name], RMA; 07/17/2016 11:45 AM) Colonoscopy  1-5 years ago  Allergies [name], RMA; 07/17/2016 11:46 AM) No Known Allergies 07/17/2016  Medication History [name], RMA; 07/17/2016 11:47 AM) ALPRAZolam (0.5MG  Tablet, Oral) Active. Alfuzosin HCl ER (10MG  Tablet ER 24HR, Oral) Active. Myrbetriq (50MG  Tablet ER 24HR, Oral) Active. Pravastatin Sodium (40MG  Tablet, Oral) Active. Garlic (1MG  Capsule, Oral) Active. Flaxseed Oil (Oral) Specific strength unknown - Active. Medications Reconciled  Social History [name], [place]; 07/17/2016 11:45 AM) Alcohol use  Occasional alcohol use. Caffeine use  Carbonated beverages, Coffee, Tea. No drug use  Tobacco use  Former smoker.  Family History [name], [place]; 07/17/2016 11:45 AM) Alcohol Abuse  Father. Breast Cancer   Sister. Colon Polyps  Brother. Diabetes Mellitus  Brother, Mother. Heart Disease  Brother, Mother. Heart disease in male family member before age 51  Heart disease in male family member before age 78  Hypertension  Brother, Mother, Son. Prostate Cancer  Brother. Thyroid problems  Mother.  Other Problems [name], [place]; 07/17/2016 11:45 AM) Anxiety Disorder  Arthritis  Back Pain  Bladder Problems  Enlarged Prostate  Hemorrhoids  High blood pressure  Hypercholesterolemia  Kidney Stone  Melanoma  Sleep Apnea  Thyroid Disease     Review of Systems [name] RMA; 07/17/2016 11:46 AM) General Present- Weight Gain. Not Present- Appetite Loss, Chills, Fatigue, Fever, Night Sweats and Weight Loss. Skin Present- Change in Wart/Mole. Not Present- Dryness, Hives, Jaundice, New Lesions, Non-Healing Wounds, Rash and Ulcer. HEENT Present- Hearing Loss, Hoarseness, Ringing in the Ears and Wears glasses/contact lenses. Not Present- Earache, Nose Bleed, Oral Ulcers, Seasonal Allergies, Sinus Pain, Sore Throat, Visual Disturbances and Yellow Eyes. Respiratory Not Present- Bloody sputum, Chronic Cough, Difficulty Breathing, Snoring and Wheezing. Breast Not Present- Breast Mass, Breast Pain, Nipple Discharge and Skin Changes. Cardiovascular Present- Leg Cramps and Swelling of Extremities. Not Present- Chest Pain, Difficulty Breathing Lying Down, Palpitations, Rapid Heart Rate and Shortness of Breath. Gastrointestinal Present- Abdominal Pain, Bloating, Constipation, Gets full quickly at meals, Hemorrhoids and Indigestion. Not Present- Bloody Stool, Change in Bowel Habits, Chronic diarrhea, Difficulty Swallowing, Excessive gas, Nausea, Rectal Pain and Vomiting. Male Genitourinary Present- Change in Urinary Stream, Frequency, Impotence, Nocturia, Urgency and Urine Leakage. Not Present- Blood in Urine and Painful Urination.  Vitals [name] RMA; 07/17/2016 11:48  AM) 07/17/2016 11:47 AM Weight: 300 lb Height: 73in Body Surface Area: 2.56 m Body Mass Index: 39.58 kg/m  Temp.: 97.74F  Pulse: 88 (Regular)  BP: 142/90 (Sitting, Left Arm, Standard)       Physical Exam Arthur Hollingshead MD; 07/17/2016 12:19 PM) The physical exam findings are as follows: Note:GENERAL APPEARANCE: Morbidly obese male in NAD. Pleasant and cooperative.  EARS, NOSE, MOUTH THROAT: [place]/AT external ears: no lesions or deformities external nose: no lesions or deformities hearing: grossly normal lips: moist, no deformities EYES external: conjunctiva, lids, sclerae normal pupils: equal, round glasses: no  CV ascultation: RRR, no murmur extremity edema: Trace extremity varicosities: no  RESP/CHEST auscultation: breath sounds equal and clear respiratory effort: normal   GASTROINTESTINAL abdomen: Soft, obese, non-tender, non-distended, no masses liver and spleen: not enlarged. hernia: Reducible umbilical hernia scar: none present   MUSCULOSKELETAL station and gait: normal deformities: none instability: none  SKIN jaundice: none rash or lesion: none  NEUROLOGIC speech: normal  PSYCHIATRIC alertness and orientation: normal mood/affect/behavior: normal judgement and insight: normal    Assessment & Plan Arthur Hollingshead MD; 02/21/1896 42:10 PM) UMBILICAL HERNIA WITHOUT OBSTRUCTION OR GANGRENE (K42.9) Impression: This is becoming more and more symptomatic. He does any lifting over 40 pounds. He is interested in having it repaired.  Plan: We discussed outpatient open umbilical hernia repair with mesh. I have discussed the procedure, risks, and aftercare. Risks include but are not limited to bleeding, infection, wound healing problems, anesthesia, recurrence, injury to intra-abdominal organs, cosmetic deformity. He seems to understand and would like to proceed. His cardiologist, Dr. [name], feels he is okay to proceed with the  surgery.  Jackolyn Confer, M.D.

## 2016-08-17 DIAGNOSIS — H7011 Chronic mastoiditis, right ear: Secondary | ICD-10-CM | POA: Diagnosis not present

## 2016-08-17 DIAGNOSIS — J342 Deviated nasal septum: Secondary | ICD-10-CM | POA: Diagnosis not present

## 2016-08-17 DIAGNOSIS — G4733 Obstructive sleep apnea (adult) (pediatric): Secondary | ICD-10-CM | POA: Diagnosis not present

## 2016-08-24 ENCOUNTER — Encounter (HOSPITAL_COMMUNITY): Payer: Self-pay

## 2016-08-24 ENCOUNTER — Encounter (INDEPENDENT_AMBULATORY_CARE_PROVIDER_SITE_OTHER): Payer: Self-pay

## 2016-08-24 ENCOUNTER — Encounter (HOSPITAL_COMMUNITY)
Admission: RE | Admit: 2016-08-24 | Discharge: 2016-08-24 | Disposition: A | Discharge status: Home / Self Care | Payer: BLUE CROSS/BLUE SHIELD | Source: Ambulatory Visit | Attending: General Surgery | Admitting: General Surgery

## 2016-08-24 DIAGNOSIS — Z01812 Encounter for preprocedural laboratory examination: Secondary | ICD-10-CM | POA: Insufficient documentation

## 2016-08-24 DIAGNOSIS — K429 Umbilical hernia without obstruction or gangrene: Secondary | ICD-10-CM | POA: Insufficient documentation

## 2016-08-24 LAB — CBC WITH DIFFERENTIAL/PLATELET
Basophils Absolute: 0 10*3/uL (ref 0.0–0.1)
Basophils Relative: 0 %
EOS PCT: 1 %
Eosinophils Absolute: 0.1 10*3/uL (ref 0.0–0.7)
HCT: 44.5 % (ref 39.0–52.0)
Hemoglobin: 15 g/dL (ref 13.0–17.0)
LYMPHS ABS: 1.4 10*3/uL (ref 0.7–4.0)
LYMPHS PCT: 19 %
MCH: 32.2 pg (ref 26.0–34.0)
MCHC: 33.7 g/dL (ref 30.0–36.0)
MCV: 95.5 fL (ref 78.0–100.0)
MONO ABS: 0.5 10*3/uL (ref 0.1–1.0)
Monocytes Relative: 7 %
Neutro Abs: 5.4 10*3/uL (ref 1.7–7.7)
Neutrophils Relative %: 74 %
PLATELETS: 123 10*3/uL — AB (ref 150–400)
RBC: 4.66 MIL/uL (ref 4.22–5.81)
RDW: 13 % (ref 11.5–15.5)
WBC: 7.3 10*3/uL (ref 4.0–10.5)

## 2016-08-24 LAB — COMPREHENSIVE METABOLIC PANEL
ALT: 26 U/L (ref 17–63)
AST: 27 U/L (ref 15–41)
Albumin: 4.3 g/dL (ref 3.5–5.0)
Alkaline Phosphatase: 33 U/L — ABNORMAL LOW (ref 38–126)
Anion gap: 8 (ref 5–15)
BUN: 13 mg/dL (ref 6–20)
CHLORIDE: 102 mmol/L (ref 101–111)
CO2: 30 mmol/L (ref 22–32)
CREATININE: 1.12 mg/dL (ref 0.61–1.24)
Calcium: 9.2 mg/dL (ref 8.9–10.3)
Glucose, Bld: 86 mg/dL (ref 65–99)
POTASSIUM: 4.7 mmol/L (ref 3.5–5.1)
Sodium: 140 mmol/L (ref 135–145)
Total Bilirubin: 0.8 mg/dL (ref 0.3–1.2)
Total Protein: 7.4 g/dL (ref 6.5–8.1)

## 2016-08-24 NOTE — Progress Notes (Signed)
03-30-16 (EPIC) EKG-Pacer, LOV Dr. [name] (pacer interrogated) 06-29-16 [facility]) Paceart Remote Device Check

## 2016-08-24 NOTE — Patient Instructions (Addendum)
ARVON SCHREINER  08/24/2016   Your procedure is scheduled on: 08/31/16    Report to [facility] Main  Entrance Take [place]  Elevators to 3rd floor to  [place] at   10:00 AM.     Call this number if you have problems the morning of surgery [phone]    Remember: ONLY 1 PERSON MAY GO WITH YOU TO SHORT STAY TO GET  READY MORNING OF [place].  Do not eat food or drink liquids :After Midnight.     Take these medicines the morning of surgery with A SIP OF WATER: Xanax if needed, Synthroid, Myrbetriq                                 You may not have any metal on your body including hair pins and              piercings  Do not wear jewelry,  lotions, powders or perfumes, deodorant                         Men may shave face and neck.   Do not bring valuables to the hospital. [place].  Contacts, dentures or bridgework may not be worn into surgery.      Patients discharged the day of surgery will not be allowed to drive home.  Name and phone number of your driver: Sunday Spillers [phone]  Please bring your mask and tubing for your CPAP machine              Please read over the following fact sheets you were given: _____________________________________________________________________             [facility] - Preparing for Surgery Before surgery, you can play an important role.  Because skin is not sterile, your skin needs to be as free of germs as possible.  You can reduce the number of germs on your skin by washing with CHG (chlorahexidine gluconate) soap before surgery.  CHG is an antiseptic cleaner which kills germs and bonds with the skin to continue killing germs even after washing. Please DO NOT use if you have an allergy to CHG or antibacterial soaps.  If your skin becomes reddened/irritated stop using the CHG and inform your nurse when you arrive at Short Stay. Do not shave (including legs and  underarms) for at least 48 hours prior to the first CHG shower.  You may shave your face/neck. Please follow these instructions carefully:  1.  Shower with CHG Soap the night before surgery and the  morning of Surgery.  2.  If you choose to wash your hair, wash your hair first as usual with your  normal  shampoo.  3.  After you shampoo, rinse your hair and body thoroughly to remove the  shampoo.                           4.  Use CHG as you would any other liquid soap.  You can apply chg directly  to the skin and wash  Gently with a scrungie or clean washcloth.  5.  Apply the CHG Soap to your body ONLY FROM THE NECK DOWN.   Do not use on face/ open                           Wound or open sores. Avoid contact with eyes, ears mouth and genitals (private parts).                       Wash face,  Genitals (private parts) with your normal soap.             6.  Wash thoroughly, paying special attention to the area where your surgery  will be performed.  7.  Thoroughly rinse your body with warm water from the neck down.  8.  DO NOT shower/wash with your normal soap after using and rinsing off  the CHG Soap.                9.  Pat yourself dry with a clean towel.            10.  Wear clean pajamas.            11.  Place clean sheets on your bed the night of your first shower and do not  sleep with pets. Day of Surgery : Do not apply any lotions/deodorants the morning of surgery.  Please wear clean clothes to the hospital/surgery center.  FAILURE TO FOLLOW THESE INSTRUCTIONS MAY RESULT IN THE CANCELLATION OF YOUR SURGERY PATIENT SIGNATURE_________________________________  NURSE SIGNATURE__________________________________  ________________________________________________________________________

## 2016-08-24 NOTE — Progress Notes (Signed)
08-24-16 CBC w/Diff routed to Dr. Zella Richer for review

## 2016-08-30 MED ORDER — DEXTROSE 5 % IV SOLN
3.0000 g | INTRAVENOUS | Status: AC
  Administered 2016-08-31: 3 g via INTRAVENOUS
  Filled 2016-08-30: qty 3

## 2016-08-31 ENCOUNTER — Ambulatory Visit (HOSPITAL_COMMUNITY): Payer: BLUE CROSS/BLUE SHIELD | Admitting: Anesthesiology

## 2016-08-31 ENCOUNTER — Encounter (HOSPITAL_COMMUNITY): Payer: Self-pay | Admitting: *Deleted

## 2016-08-31 ENCOUNTER — Encounter (HOSPITAL_COMMUNITY)
Admission: RE | Disposition: A | Discharge status: Home / Self Care | Payer: Self-pay | Source: Ambulatory Visit | Attending: General Surgery

## 2016-08-31 ENCOUNTER — Ambulatory Visit (HOSPITAL_COMMUNITY)
Admission: RE | Admit: 2016-08-31 | Discharge: 2016-08-31 | Disposition: A | Discharge status: Home / Self Care | Payer: BLUE CROSS/BLUE SHIELD | Source: Ambulatory Visit | Attending: General Surgery | Admitting: General Surgery

## 2016-08-31 DIAGNOSIS — K429 Umbilical hernia without obstruction or gangrene: Secondary | ICD-10-CM | POA: Insufficient documentation

## 2016-08-31 DIAGNOSIS — G4730 Sleep apnea, unspecified: Secondary | ICD-10-CM | POA: Diagnosis not present

## 2016-08-31 DIAGNOSIS — I10 Essential (primary) hypertension: Secondary | ICD-10-CM | POA: Insufficient documentation

## 2016-08-31 DIAGNOSIS — Z85820 Personal history of malignant melanoma of skin: Secondary | ICD-10-CM | POA: Insufficient documentation

## 2016-08-31 DIAGNOSIS — Z87442 Personal history of urinary calculi: Secondary | ICD-10-CM | POA: Diagnosis not present

## 2016-08-31 DIAGNOSIS — Z950 Presence of cardiac pacemaker: Secondary | ICD-10-CM | POA: Diagnosis not present

## 2016-08-31 DIAGNOSIS — N400 Enlarged prostate without lower urinary tract symptoms: Secondary | ICD-10-CM | POA: Diagnosis not present

## 2016-08-31 DIAGNOSIS — Z6839 Body mass index (BMI) 39.0-39.9, adult: Secondary | ICD-10-CM | POA: Insufficient documentation

## 2016-08-31 DIAGNOSIS — Z86010 Personal history of colonic polyps: Secondary | ICD-10-CM | POA: Insufficient documentation

## 2016-08-31 DIAGNOSIS — Z7982 Long term (current) use of aspirin: Secondary | ICD-10-CM | POA: Insufficient documentation

## 2016-08-31 DIAGNOSIS — E039 Hypothyroidism, unspecified: Secondary | ICD-10-CM | POA: Diagnosis not present

## 2016-08-31 DIAGNOSIS — I4891 Unspecified atrial fibrillation: Secondary | ICD-10-CM | POA: Diagnosis not present

## 2016-08-31 DIAGNOSIS — Z87891 Personal history of nicotine dependence: Secondary | ICD-10-CM | POA: Diagnosis not present

## 2016-08-31 DIAGNOSIS — E7800 Pure hypercholesterolemia, unspecified: Secondary | ICD-10-CM | POA: Insufficient documentation

## 2016-08-31 DIAGNOSIS — Z9989 Dependence on other enabling machines and devices: Secondary | ICD-10-CM | POA: Diagnosis not present

## 2016-08-31 DIAGNOSIS — Z888 Allergy status to other drugs, medicaments and biological substances status: Secondary | ICD-10-CM | POA: Diagnosis not present

## 2016-08-31 DIAGNOSIS — Z79899 Other long term (current) drug therapy: Secondary | ICD-10-CM | POA: Diagnosis not present

## 2016-08-31 DIAGNOSIS — F419 Anxiety disorder, unspecified: Secondary | ICD-10-CM | POA: Insufficient documentation

## 2016-08-31 DIAGNOSIS — M1990 Unspecified osteoarthritis, unspecified site: Secondary | ICD-10-CM | POA: Insufficient documentation

## 2016-08-31 HISTORY — PX: INSERTION OF MESH: SHX5868

## 2016-08-31 HISTORY — PX: UMBILICAL HERNIA REPAIR: SHX196

## 2016-08-31 SURGERY — REPAIR, HERNIA, UMBILICAL, ADULT
Anesthesia: General | Site: Abdomen

## 2016-08-31 MED ORDER — CHLORHEXIDINE GLUCONATE CLOTH 2 % EX PADS: 6.0000 | MEDICATED_PAD | Freq: Once | CUTANEOUS | Status: DC

## 2016-08-31 MED ORDER — PHENYLEPHRINE 40 MCG/ML (10ML) SYRINGE FOR IV PUSH (FOR BLOOD PRESSURE SUPPORT)
PREFILLED_SYRINGE | INTRAVENOUS | Status: DC | PRN
  Administered 2016-08-31 (×4): 80 ug via INTRAVENOUS

## 2016-08-31 MED ORDER — DEXAMETHASONE SODIUM PHOSPHATE 10 MG/ML IJ SOLN
INTRAMUSCULAR | Status: AC
  Filled 2016-08-31: qty 1

## 2016-08-31 MED ORDER — BUPIVACAINE-EPINEPHRINE 0.5% -1:200000 IJ SOLN
INTRAMUSCULAR | Status: DC | PRN
  Administered 2016-08-31: 30 mL

## 2016-08-31 MED ORDER — PROPOFOL 10 MG/ML IV BOLUS
INTRAVENOUS | Status: DC | PRN
  Administered 2016-08-31: 100 mg via INTRAVENOUS
  Administered 2016-08-31: 200 mg via INTRAVENOUS

## 2016-08-31 MED ORDER — ROCURONIUM BROMIDE 10 MG/ML (PF) SYRINGE
PREFILLED_SYRINGE | INTRAVENOUS | Status: DC | PRN
  Administered 2016-08-31: 10 mg via INTRAVENOUS
  Administered 2016-08-31: 30 mg via INTRAVENOUS
  Administered 2016-08-31 (×3): 10 mg via INTRAVENOUS

## 2016-08-31 MED ORDER — SODIUM CHLORIDE 0.9 % IR SOLN
Status: DC | PRN
  Administered 2016-08-31: 1000 mL

## 2016-08-31 MED ORDER — OXYCODONE HCL 5 MG PO TABS: 5.0000 mg | ORAL_TABLET | Freq: Once | ORAL | Status: DC | PRN

## 2016-08-31 MED ORDER — ACETAMINOPHEN 650 MG RE SUPP
650.0000 mg | RECTAL | Status: DC | PRN
  Filled 2016-08-31: qty 1

## 2016-08-31 MED ORDER — ROCURONIUM BROMIDE 50 MG/5ML IV SOSY
PREFILLED_SYRINGE | INTRAVENOUS | Status: AC
  Filled 2016-08-31: qty 5

## 2016-08-31 MED ORDER — FENTANYL CITRATE (PF) 100 MCG/2ML IJ SOLN
25.0000 ug | INTRAMUSCULAR | Status: DC | PRN
  Administered 2016-08-31 (×2): 25 ug via INTRAVENOUS
  Administered 2016-08-31: 50 ug via INTRAVENOUS

## 2016-08-31 MED ORDER — ONDANSETRON HCL 4 MG/2ML IJ SOLN
INTRAMUSCULAR | Status: DC | PRN
  Administered 2016-08-31: 4 mg via INTRAVENOUS

## 2016-08-31 MED ORDER — BUPIVACAINE HCL (PF) 0.5 % IJ SOLN
INTRAMUSCULAR | Status: AC
  Filled 2016-08-31: qty 30

## 2016-08-31 MED ORDER — PROPOFOL 10 MG/ML IV BOLUS
INTRAVENOUS | Status: AC
  Filled 2016-08-31: qty 20

## 2016-08-31 MED ORDER — SUGAMMADEX SODIUM 500 MG/5ML IV SOLN
INTRAVENOUS | Status: AC
  Filled 2016-08-31: qty 5

## 2016-08-31 MED ORDER — SODIUM CHLORIDE 0.9% FLUSH: 3.0000 mL | INTRAVENOUS | Status: DC | PRN

## 2016-08-31 MED ORDER — ONDANSETRON HCL 4 MG/2ML IJ SOLN
INTRAMUSCULAR | Status: AC
Start: 2016-08-31 — End: 2016-08-31
  Filled 2016-08-31: qty 2

## 2016-08-31 MED ORDER — LACTATED RINGERS IV SOLN
INTRAVENOUS | Status: DC
  Administered 2016-08-31 (×2): via INTRAVENOUS

## 2016-08-31 MED ORDER — ACETAMINOPHEN 325 MG PO TABS: 650.0000 mg | ORAL_TABLET | ORAL | Status: DC | PRN

## 2016-08-31 MED ORDER — DEXAMETHASONE SODIUM PHOSPHATE 10 MG/ML IJ SOLN
INTRAMUSCULAR | Status: DC | PRN
  Administered 2016-08-31: 10 mg via INTRAVENOUS

## 2016-08-31 MED ORDER — FENTANYL CITRATE (PF) 250 MCG/5ML IJ SOLN
INTRAMUSCULAR | Status: AC
  Filled 2016-08-31: qty 5

## 2016-08-31 MED ORDER — OXYCODONE HCL 5 MG/5ML PO SOLN
5.0000 mg | Freq: Once | ORAL | Status: DC | PRN
  Filled 2016-08-31: qty 5

## 2016-08-31 MED ORDER — OXYCODONE HCL 5 MG PO TABS: 5.0000 mg | ORAL_TABLET | ORAL | Status: DC | PRN

## 2016-08-31 MED ORDER — LIDOCAINE 2% (20 MG/ML) 5 ML SYRINGE
INTRAMUSCULAR | Status: DC | PRN
  Administered 2016-08-31: 100 mg via INTRAVENOUS

## 2016-08-31 MED ORDER — LIDOCAINE 2% (20 MG/ML) 5 ML SYRINGE
INTRAMUSCULAR | Status: AC
  Filled 2016-08-31: qty 5

## 2016-08-31 MED ORDER — FENTANYL CITRATE (PF) 100 MCG/2ML IJ SOLN: 25.0000 ug | INTRAMUSCULAR | Status: DC | PRN

## 2016-08-31 MED ORDER — SUCCINYLCHOLINE CHLORIDE 200 MG/10ML IV SOSY
PREFILLED_SYRINGE | INTRAVENOUS | Status: DC | PRN
  Administered 2016-08-31: 200 mg via INTRAVENOUS

## 2016-08-31 MED ORDER — SUCCINYLCHOLINE CHLORIDE 200 MG/10ML IV SOSY
PREFILLED_SYRINGE | INTRAVENOUS | Status: AC
  Filled 2016-08-31: qty 10

## 2016-08-31 MED ORDER — OXYCODONE HCL 5 MG PO TABS: 5.0000 mg | ORAL_TABLET | ORAL | 0 refills | Status: DC | PRN

## 2016-08-31 MED ORDER — FENTANYL CITRATE (PF) 100 MCG/2ML IJ SOLN
INTRAMUSCULAR | Status: AC
  Filled 2016-08-31: qty 2

## 2016-08-31 MED ORDER — FENTANYL CITRATE (PF) 100 MCG/2ML IJ SOLN
INTRAMUSCULAR | Status: DC | PRN
  Administered 2016-08-31: 50 ug via INTRAVENOUS
  Administered 2016-08-31: 150 ug via INTRAVENOUS

## 2016-08-31 MED ORDER — PHENYLEPHRINE 40 MCG/ML (10ML) SYRINGE FOR IV PUSH (FOR BLOOD PRESSURE SUPPORT)
PREFILLED_SYRINGE | INTRAVENOUS | Status: AC
  Filled 2016-08-31: qty 10

## 2016-08-31 MED ORDER — SUGAMMADEX SODIUM 200 MG/2ML IV SOLN
INTRAVENOUS | Status: DC | PRN
  Administered 2016-08-31: 400 mg via INTRAVENOUS

## 2016-08-31 SURGICAL SUPPLY — 34 items
BINDER ABDOMINAL 12 ML 46-62 (SOFTGOODS) IMPLANT
BLADE HEX COATED 2.75 (ELECTRODE) ×3 IMPLANT
CHLORAPREP W/TINT 26ML (MISCELLANEOUS) ×3 IMPLANT
CLOSURE WOUND 1/2 X4 (GAUZE/BANDAGES/DRESSINGS) ×1
DRAIN CHANNEL 19F RND (DRAIN) IMPLANT
DRAPE INCISE IOBAN 66X45 STRL (DRAPES) ×2 IMPLANT
DRAPE LAPAROSCOPIC ABDOMINAL (DRAPES) ×3 IMPLANT
DRAPE UTILITY XL STRL (DRAPES) ×3 IMPLANT
DRSG PAD ABDOMINAL 8X10 ST (GAUZE/BANDAGES/DRESSINGS) IMPLANT
DRSG TEGADERM 4X4.75 (GAUZE/BANDAGES/DRESSINGS) ×5 IMPLANT
ELECT REM PT RETURN 15FT ADLT (MISCELLANEOUS) ×3 IMPLANT
EVACUATOR SILICONE 100CC (DRAIN) IMPLANT
GLOVE ECLIPSE 8.0 STRL XLNG CF (GLOVE) ×5 IMPLANT
GLOVE INDICATOR 8.0 STRL GRN (GLOVE) ×3 IMPLANT
GOWN STRL REUS W/TWL XL LVL3 (GOWN DISPOSABLE) ×6 IMPLANT
KIT BASIN OR (CUSTOM PROCEDURE TRAY) ×3 IMPLANT
MESH HERNIA 3X6 (Mesh General) ×2 IMPLANT
NDL HYPO 25X1 1.5 SAFETY (NEEDLE) ×1 IMPLANT
NEEDLE HYPO 25X1 1.5 SAFETY (NEEDLE) ×3 IMPLANT
PACK GENERAL/GYN (CUSTOM PROCEDURE TRAY) ×3 IMPLANT
SEALER TISSUE X1 CVD JAW (INSTRUMENTS) IMPLANT
STAPLER VISISTAT 35W (STAPLE) ×1 IMPLANT
STRIP CLOSURE SKIN 1/2X4 (GAUZE/BANDAGES/DRESSINGS) ×1 IMPLANT
SUT ETHILON 3 0 PS 1 (SUTURE) IMPLANT
SUT MNCRL AB 3-0 PS2 18 (SUTURE) ×2 IMPLANT
SUT MNCRL AB 4-0 PS2 18 (SUTURE) ×2 IMPLANT
SUT NOVA NAB DX-16 0-1 5-0 T12 (SUTURE) ×4 IMPLANT
SUT PROLENE 0 CT 1 30 (SUTURE) ×2 IMPLANT
SUT VIC AB 2-0 CT1 27 (SUTURE)
SUT VIC AB 2-0 CT1 TAPERPNT 27 (SUTURE) IMPLANT
SYR 20CC LL (SYRINGE) ×3 IMPLANT
TOWEL OR 17X26 10 PK STRL BLUE (TOWEL DISPOSABLE) ×3 IMPLANT
TOWEL OR NON WOVEN STRL DISP B (DISPOSABLE) ×3 IMPLANT
TRAY FOLEY W/METER SILVER 16FR (SET/KITS/TRAYS/PACK) IMPLANT

## 2016-08-31 NOTE — Anesthesia Preprocedure Evaluation (Addendum)
Anesthesia Evaluation  Patient identified by MRN, date of birth, ID band Patient awake    Reviewed: Allergy & Precautions, NPO status , Patient's Chart, lab work & pertinent test results  History of Anesthesia Complications Negative for: history of anesthetic complications  Airway Mallampati: II  TM Distance: >3 FB Neck ROM: Full    Dental  (+) Edentulous Upper, Teeth Intact   Pulmonary sleep apnea and Continuous Positive Airway Pressure Ventilation , former smoker,    breath sounds clear to auscultation       Cardiovascular hypertension, (-) angina(-) Past MI and (-) CHF + pacemaker  Rhythm:Regular     Neuro/Psych negative neurological ROS  negative psych ROS   GI/Hepatic Neg liver ROS, Umbilical hernia   Endo/Other  Hypothyroidism Morbid obesity  Renal/GU Renal InsufficiencyRenal disease     Musculoskeletal   Abdominal   Peds  Hematology negative hematology ROS (+)   Anesthesia Other Findings   Reproductive/Obstetrics                            Anesthesia Physical Anesthesia Plan  ASA: III  Anesthesia Plan: General   Post-op Pain Management:    Induction: Intravenous  PONV Risk Score and Plan: 2 and Ondansetron and Dexamethasone  Airway Management Planned: Oral ETT  Additional Equipment: None  Intra-op Plan:   Post-operative Plan: Extubation in OR  Informed Consent: I have reviewed the patients History and Physical, chart, labs and discussed the procedure including the risks, benefits and alternatives for the proposed anesthesia with the patient or authorized representative who has indicated his/her understanding and acceptance.   Dental advisory given  Plan Discussed with: CRNA and Surgeon  Anesthesia Plan Comments:         Anesthesia Quick Evaluation

## 2016-08-31 NOTE — Discharge Instructions (Signed)
[facility] _______[facility]   UMBILICAL HERNIA REPAIR: POST OP INSTRUCTIONS  Always review your discharge instruction sheet given to you by the facility where your surgery was performed. IF YOU HAVE DISABILITY OR FAMILY LEAVE FORMS, YOU MUST BRING THEM TO THE OFFICE FOR PROCESSING.   DO NOT GIVE THEM TO YOUR DOCTOR.  1. A  prescription for pain medication may be given to you upon discharge.  Take your pain medication as prescribed, if needed.  If narcotic pain medicine is not needed, then you may take acetaminophen (Tylenol) or ibuprofen (Advil) as needed. 2. Take your usually prescribed medications unless otherwise directed. 3. If you need a refill on your pain medication, please contact your pharmacy.  They will contact our office to request authorization. Prescriptions will not be filled after 5 pm or on week-ends. 4. You should follow a light diet the first 24 hours after arrival home, such as soup and crackers, etc.  Be sure to include lots of fluids daily.  Resume your normal diet the day after surgery. 5. Most patients will experience some swelling and bruising around the umbilicus (belly button).  Ice packs and reclining will help.  Swelling and bruising can take many days to resolve.  6. It is common to experience some constipation if taking pain medication after surgery.  Increasing fluid intake and taking a stool softener (such as Colace) will usually help or prevent this problem from occurring.  A mild laxative (Milk of Magnesia or Miralax) should be taken according to package directions if there are no bowel movements after 48 hours. 7. Unless discharge instructions indicate otherwise, you may remove your bandages 72 hours after surgery, and you may shower at that time.  You may have steri-strips (small skin tapes) in place directly over the incision.  These strips should be left on the skin.  If your surgeon used skin glue on the incision, you may shower in 24 hours.  The glue  will flake off over the next 2-3 weeks.  Any sutures or staples will be removed at the office during your follow-up visit. 8. ACTIVITIES:  You may resume regular (light) daily activities beginning the next day--such as daily self-care, walking, climbing stairs--gradually increasing activities as tolerated.  You may have sexual intercourse when it is comfortable.  Refrain from any heavy lifting or straining-nothing over 10 pounds for 6 weeks. Do not lie completely flat for the first 3 days. a. You may drive when you are no longer taking prescription pain medication, you can comfortably wear a seatbelt, and you can safely maneuver your car and apply brakes. b. RETURN TO WORK:  Desk work/Light work in 1-2 weeks, full duty in 6 weeks._________________________________________________________ 9. You should see your doctor in the office for a follow-up appointment approximately 2-3 weeks after your surgery.  Make sure that you call for this appointment within a day or two after you arrive home to insure a convenient appointment time. 10. OTHER INSTRUCTIONS:  __Do not lie flat. ________________________________________________________________________________________________________________________________________________________________________________________  WHEN TO CALL YOUR DOCTOR: 1. Fever over 101.0 2. Inability to urinate 3. Nausea and/or vomiting 4. Extreme swelling or bruising 5. Continued bleeding from incision. 6. Increased pain, redness, or drainage from the incision  The clinic staff is available to answer your questions during regular business hours.  Please dont hesitate to call and ask to speak to one of the nurses for clinical concerns.  If you have a medical emergency, go to the nearest emergency room or call  911.  A surgeon from [facility] is always on call at the hospital   [address], [place], [place], [place]  [address] ?  [address], [place], [place] [phone] ? [phone] ? FAX [phone] Web site: [email or web address]

## 2016-08-31 NOTE — Anesthesia Procedure Notes (Signed)
Procedure Name: Intubation Date/Time: 08/31/2016 11:46 AM Performed by: Danley Danker L Patient Re-evaluated:Patient Re-evaluated prior to induction Oxygen Delivery Method: Circle system utilized Preoxygenation: Pre-oxygenation with 100% oxygen Induction Type: IV induction Ventilation: Mask ventilation without difficulty and Oral airway inserted - appropriate to patient size Laryngoscope Size: Miller and 3 Grade View: Grade I Tube type: Oral Tube size: 8.0 mm Number of attempts: 1 Airway Equipment and Method: Stylet Placement Confirmation: ETT inserted through vocal cords under direct vision,  positive ETCO2 and breath sounds checked- equal and bilateral Secured at: 23 cm Tube secured with: Tape Dental Injury: Teeth and Oropharynx as per pre-operative assessment

## 2016-08-31 NOTE — H&P (View-Only) (Signed)
Arthur Daniel 07/17/2016 11:45 AM Location: [facility] Patient #: [id number] DOB: 07/22/1940 Married / Language: English / Race: White Male   History of Present Illness Arthur Hollingshead MD; 07/17/2016 12:17 PM) The patient is a 76 year old male.  Note:He is referred by Dr. Nani Daniel for consultation regarding a symptomatic umbilical hernia. He reports that the hernia is been present for about 30 years. Recently, he lifted a friends children and began developing pain around the hernia area. Now, when he lifts anything over 40 pounds, he gets pain around the umbilical hernia. No obstruction symptoms. He takes medicine for his BPH and does not have to strain to urinate anymore. He denies constipation. He states he has been trying to lose weight by altering his diet but has been unable to do so.  Past Surgical History Arthur Daniel, [place]; 07/17/2016 11:45 AM) Colon Polyp Removal - Colonoscopy  Foot Surgery  Left. Hemorrhoidectomy  Knee Surgery  Bilateral. Oral Surgery  Shoulder Surgery  Bilateral. Tonsillectomy  Vasectomy   Diagnostic Studies History Arthur Daniel, RMA; 07/17/2016 11:45 AM) Colonoscopy  1-5 years ago  Allergies Arthur Daniel, RMA; 07/17/2016 11:46 AM) No Known Allergies 07/17/2016  Medication History Arthur Daniel, RMA; 07/17/2016 11:47 AM) ALPRAZolam (0.5MG  Tablet, Oral) Active. Alfuzosin HCl ER (10MG  Tablet ER 24HR, Oral) Active. Myrbetriq (50MG  Tablet ER 24HR, Oral) Active. Pravastatin Sodium (40MG  Tablet, Oral) Active. Garlic (1MG  Capsule, Oral) Active. Flaxseed Oil (Oral) Specific strength unknown - Active. Medications Reconciled  Social History Arthur Daniel, [place]; 07/17/2016 11:45 AM) Alcohol use  Occasional alcohol use. Caffeine use  Carbonated beverages, Coffee, Tea. No drug use  Tobacco use  Former smoker.  Family History Arthur Daniel, [place]; 07/17/2016 11:45 AM) Alcohol Abuse  Father. Breast Cancer   Sister. Colon Polyps  Brother. Diabetes Mellitus  Brother, Mother. Heart Disease  Brother, Mother. Heart disease in male family member before age 75  Heart disease in male family member before age 80  Hypertension  Brother, Mother, Son. Prostate Cancer  Brother. Thyroid problems  Mother.  Other Problems Arthur Daniel, [place]; 07/17/2016 11:45 AM) Anxiety Disorder  Arthritis  Back Pain  Bladder Problems  Enlarged Prostate  Hemorrhoids  High blood pressure  Hypercholesterolemia  Kidney Stone  Melanoma  Sleep Apnea  Thyroid Disease     Review of Systems Arthur Daniel RMA; 07/17/2016 11:46 AM) General Present- Weight Gain. Not Present- Appetite Loss, Chills, Fatigue, Fever, Night Sweats and Weight Loss. Skin Present- Change in Wart/Mole. Not Present- Dryness, Hives, Jaundice, New Lesions, Non-Healing Wounds, Rash and Ulcer. HEENT Present- Hearing Loss, Hoarseness, Ringing in the Ears and Wears glasses/contact lenses. Not Present- Earache, Nose Bleed, Oral Ulcers, Seasonal Allergies, Sinus Pain, Sore Throat, Visual Disturbances and Yellow Eyes. Respiratory Not Present- Bloody sputum, Chronic Cough, Difficulty Breathing, Snoring and Wheezing. Breast Not Present- Breast Mass, Breast Pain, Nipple Discharge and Skin Changes. Cardiovascular Present- Leg Cramps and Swelling of Extremities. Not Present- Chest Pain, Difficulty Breathing Lying Down, Palpitations, Rapid Heart Rate and Shortness of Breath. Gastrointestinal Present- Abdominal Pain, Bloating, Constipation, Gets full quickly at meals, Hemorrhoids and Indigestion. Not Present- Bloody Stool, Change in Bowel Habits, Chronic diarrhea, Difficulty Swallowing, Excessive gas, Nausea, Rectal Pain and Vomiting. Male Genitourinary Present- Change in Urinary Stream, Frequency, Impotence, Nocturia, Urgency and Urine Leakage. Not Present- Blood in Urine and Painful Urination.  Vitals Arthur Daniel RMA; 07/17/2016 11:48  AM) 07/17/2016 11:47 AM Weight: 300 lb Height: 73in Body Surface Area: 2.56 m Body Mass Index: 39.58 kg/m  Temp.: 97.55F  Pulse: 88 (Regular)  BP: 142/90 (Sitting, Left Arm, Standard)       Physical Exam Arthur Hollingshead MD; 07/17/2016 12:19 PM) The physical exam findings are as follows: Note:GENERAL APPEARANCE: Morbidly obese male in NAD. Pleasant and cooperative.  EARS, NOSE, MOUTH THROAT: [place]/AT external ears: no lesions or deformities external nose: no lesions or deformities hearing: grossly normal lips: moist, no deformities EYES external: conjunctiva, lids, sclerae normal pupils: equal, round glasses: no  CV ascultation: RRR, no murmur extremity edema: Trace extremity varicosities: no  RESP/CHEST auscultation: breath sounds equal and clear respiratory effort: normal   GASTROINTESTINAL abdomen: Soft, obese, non-tender, non-distended, no masses liver and spleen: not enlarged. hernia: Reducible umbilical hernia scar: none present   MUSCULOSKELETAL station and gait: normal deformities: none instability: none  SKIN jaundice: none rash or lesion: none  NEUROLOGIC speech: normal  PSYCHIATRIC alertness and orientation: normal mood/affect/behavior: normal judgement and insight: normal    Assessment & Plan Arthur Hollingshead MD; 5/0/9326 71:24 PM) UMBILICAL HERNIA WITHOUT OBSTRUCTION OR GANGRENE (K42.9) Impression: This is becoming more and more symptomatic. He does any lifting over 40 pounds. He is interested in having it repaired.  Plan: We discussed outpatient open umbilical hernia repair with mesh. I have discussed the procedure, risks, and aftercare. Risks include but are not limited to bleeding, infection, wound healing problems, anesthesia, recurrence, injury to intra-abdominal organs, cosmetic deformity. He seems to understand and would like to proceed. His cardiologist, Dr. Caryl Daniel, feels he is okay to proceed with the  surgery.  Jackolyn Confer, M.D.

## 2016-08-31 NOTE — Anesthesia Procedure Notes (Signed)
Date/Time: 08/31/2016 11:58 AM Performed by: Marinus Maw, Teniya Filter L Ventilation: Nasal airway inserted- appropriate to patient size

## 2016-08-31 NOTE — Interval H&P Note (Signed)
History and Physical Interval Note:  08/31/2016 11:26 AM  Arthur Daniel  has presented today for surgery, with the diagnosis of umbilical hernia  The various methods of treatment have been discussed with the patient and family. After consideration of risks, benefits and other options for treatment, the patient has consented to  Procedure(s): UMBILICAL HERNIA REPAIR WITH MESH (N/A) INSERTION OF MESH (N/A) as a surgical intervention .  The patient's history has been reviewed, patient examined, no change in status, stable for surgery.  I have reviewed the patient's chart and labs.  Questions were answered to the patient's satisfaction.     Adasha Boehme Lenna Sciara

## 2016-08-31 NOTE — Anesthesia Postprocedure Evaluation (Signed)
Anesthesia Post Note  Patient: Arthur Daniel  Procedure(s) Performed: Procedure(s) (LRB): UMBILICAL HERNIA REPAIR WITH MESH (N/A) INSERTION OF MESH (N/A)     Patient location during evaluation: PACU Anesthesia Type: General Level of consciousness: awake and alert Pain management: pain level controlled Vital Signs Assessment: post-procedure vital signs reviewed and stable Respiratory status: spontaneous breathing, nonlabored ventilation, respiratory function stable and patient connected to nasal cannula oxygen Cardiovascular status: blood pressure returned to baseline and stable Postop Assessment: no signs of nausea or vomiting Anesthetic complications: no    Last Vitals:  Vitals:   08/31/16 1430 08/31/16 1442  BP: (!) 143/74 139/75  Pulse: 75 75  Resp: 20 16  Temp: 36.7 C 36.8 C  SpO2: 93% 95%    Last Pain:  Vitals:   08/31/16 1442  TempSrc:   PainSc: 3                  Ileta Ofarrell

## 2016-08-31 NOTE — Transfer of Care (Signed)
Immediate Anesthesia Transfer of Care Note  Patient: Arthur Daniel  Procedure(s) Performed: Procedure(s): UMBILICAL HERNIA REPAIR WITH MESH (N/A) INSERTION OF MESH (N/A)  Patient Location: PACU  Anesthesia Type:General  Level of Consciousness: awake, alert  and oriented  Airway & Oxygen Therapy: Patient Spontanous Breathing and Patient connected to face mask oxygen  Post-op Assessment: Report given to RN and Post -op Vital signs reviewed and stable  Post vital signs: Reviewed and stable  Last Vitals:  Vitals:   08/31/16 0938  BP: (!) 146/79  Pulse: 72  Resp: 18  Temp: 36.9 C  SpO2: 97%    Last Pain:  Vitals:   08/31/16 0938  TempSrc: Oral      Patients Stated Pain Goal: 4 (98/72/15 8727)  Complications: No apparent anesthesia complications

## 2016-08-31 NOTE — Op Note (Signed)
OPERATIVE NOTE- UMBILICAL HERNIA REPAIR  Preoperative diagnosis: Umbilical hernia  Postoperative diagnosis: Same  Procedure: Umbilical hernia repair with mesh.  Surgeon: Jackolyn Confer M.D.  Anesthesia: General plus Marcaine local  Findings: 3 hernias   Indication: This is a 76 year old male with a symptomatic umbilical hernia who presents for elective repair.  Technique: He was seen in the holding room and then brought to the operating room, placed supine on the operating table, and a general anesthetic was given.  The hair in the periumbilical area was clipped as was necessary.  The periumbilical area was sterilely prepped and draped. A timeout was performed.  Marcaine solution was infiltrated superficially and deep in the periumbilical area. A subumbilical transverse incision was made through the skin and subcutaneous tissue until the fascia was identified. The subcutaneous tissue was mobilized free from the fascia inferiorly and laterally. The umbilical stalk was mobilized and dissected free from the fascia exposing a 2.5 cm underlying hernia defect. Superior to this, two other small hernia defects were identified. The hernia sacs were reduced back into the abdominal cavity. One of the smaller hernias with separated from the larger hernia by a very thin fascial bridge was was divided, connecting the two hernias.  The subcutaneous tissue was freed from the fascia for 3-4 cm around the hernia defects. The larger defect was then closed with interrupted #1 Novofil sutures. The sutures were left long. The remaining smaller defect was closed with a single #1 Novofil suture  left long. A piece of polypropylene mesh was brought into the field. The hernia repair sutures were threaded up through the mesh and tied down anchoring the mesh directly over the primary repair. The periphery of the mesh was then anchored to the fascia with a running 0 Prolene suture to allow for 3-4 cm of mesh overlap. The  excess mesh was trimmed and removed.  Hemostasis was adequate at this time. The umbilicus was reimplanted onto the mesh and fascia with 3-0 Vicryl suture. The subcutaneous tissue was closed over the mesh with a running 3-0 Vicryl suture. The skin was closed with a 4-0 Monocryl subcuticular stitch. Steri-Strips and sterile dressings were applied.  He tolerated the procedure well without any apparent complications and was taken to the recovery room in satisfactory condition.

## 2016-09-04 ENCOUNTER — Ambulatory Visit: Payer: BLUE CROSS/BLUE SHIELD | Admitting: Family Medicine

## 2016-09-28 ENCOUNTER — Ambulatory Visit (INDEPENDENT_AMBULATORY_CARE_PROVIDER_SITE_OTHER): Payer: BLUE CROSS/BLUE SHIELD | Admitting: *Deleted

## 2016-09-28 DIAGNOSIS — I495 Sick sinus syndrome: Secondary | ICD-10-CM

## 2016-09-28 NOTE — Progress Notes (Signed)
Remote pacemaker transmission.   

## 2016-09-29 ENCOUNTER — Encounter: Payer: Self-pay | Admitting: Cardiology

## 2016-10-06 LAB — CUP PACEART REMOTE DEVICE CHECK
Battery Remaining Longevity: 48 mo
Battery Remaining Percentage: 40 %
Brady Statistic AP VS Percent: 27 %
Brady Statistic RA Percent Paced: 27 %
Implantable Lead Implant Date: 20101111
Implantable Lead Location: 753859
Implantable Lead Location: 753860
Lead Channel Pacing Threshold Amplitude: 1 V
Lead Channel Pacing Threshold Pulse Width: 0.4 ms
Lead Channel Setting Pacing Amplitude: 2 V
Lead Channel Setting Pacing Pulse Width: 0.4 ms
MDC IDC LEAD IMPLANT DT: 20101111
MDC IDC MSMT BATTERY VOLTAGE: 2.84 V
MDC IDC MSMT LEADCHNL RA IMPEDANCE VALUE: 340 Ohm
MDC IDC MSMT LEADCHNL RA PACING THRESHOLD AMPLITUDE: 0.75 V
MDC IDC MSMT LEADCHNL RA SENSING INTR AMPL: 4.7 mV
MDC IDC MSMT LEADCHNL RV IMPEDANCE VALUE: 410 Ohm
MDC IDC MSMT LEADCHNL RV PACING THRESHOLD PULSEWIDTH: 0.4 ms
MDC IDC MSMT LEADCHNL RV SENSING INTR AMPL: 10.9 mV
MDC IDC PG IMPLANT DT: 20101111
MDC IDC SESS DTM: 20180913064830
MDC IDC SET LEADCHNL RV PACING AMPLITUDE: 2.5 V
MDC IDC SET LEADCHNL RV SENSING SENSITIVITY: 2 mV
MDC IDC STAT BRADY AP VP PERCENT: 1 %
MDC IDC STAT BRADY AS VP PERCENT: 1 %
MDC IDC STAT BRADY AS VS PERCENT: 72 %
MDC IDC STAT BRADY RV PERCENT PACED: 1 %
Pulse Gen Model: 2210
Pulse Gen Serial Number: 7079515

## 2016-11-01 ENCOUNTER — Encounter: Payer: Self-pay | Admitting: Family Medicine

## 2016-11-01 ENCOUNTER — Ambulatory Visit (INDEPENDENT_AMBULATORY_CARE_PROVIDER_SITE_OTHER): Payer: BLUE CROSS/BLUE SHIELD | Admitting: Family Medicine

## 2016-11-01 VITALS — BP 132/72 | HR 67 | Temp 98.3°F | Ht 73.0 in | Wt 302.4 lb

## 2016-11-01 DIAGNOSIS — I10 Essential (primary) hypertension: Secondary | ICD-10-CM

## 2016-11-01 DIAGNOSIS — R0609 Other forms of dyspnea: Secondary | ICD-10-CM | POA: Diagnosis not present

## 2016-11-01 DIAGNOSIS — E890 Postprocedural hypothyroidism: Secondary | ICD-10-CM | POA: Diagnosis not present

## 2016-11-01 DIAGNOSIS — Z9189 Other specified personal risk factors, not elsewhere classified: Secondary | ICD-10-CM

## 2016-11-01 DIAGNOSIS — Z23 Encounter for immunization: Secondary | ICD-10-CM | POA: Diagnosis not present

## 2016-11-01 LAB — COMPREHENSIVE METABOLIC PANEL
ALK PHOS: 30 U/L — AB (ref 39–117)
ALT: 22 U/L (ref 0–53)
AST: 17 U/L (ref 0–37)
Albumin: 4.2 g/dL (ref 3.5–5.2)
BILIRUBIN TOTAL: 0.6 mg/dL (ref 0.2–1.2)
BUN: 14 mg/dL (ref 6–23)
CO2: 26 mEq/L (ref 19–32)
Calcium: 9.2 mg/dL (ref 8.4–10.5)
Chloride: 104 mEq/L (ref 96–112)
Creatinine, Ser: 0.95 mg/dL (ref 0.40–1.50)
GFR: 82.4 mL/min (ref >60.00)
GLUCOSE: 117 mg/dL — AB (ref 70–99)
Potassium: 3.9 mEq/L (ref 3.5–5.1)
Sodium: 139 mEq/L (ref 135–145)
TOTAL PROTEIN: 6.9 g/dL (ref 6.0–8.3)

## 2016-11-01 LAB — LIPID PANEL
Cholesterol: 217 mg/dL — ABNORMAL HIGH (ref 0–200)
HDL: 40 mg/dL (ref >39.00)
NONHDL: 176.55
TRIGLYCERIDES: 214 mg/dL — AB (ref 0.0–149.0)
Total CHOL/HDL Ratio: 5
VLDL: 42.8 mg/dL — ABNORMAL HIGH (ref 0.0–40.0)

## 2016-11-01 LAB — CBC
HCT: 45.1 % (ref 39.0–52.0)
HEMOGLOBIN: 14.9 g/dL (ref 13.0–17.0)
MCHC: 33 g/dL (ref 30.0–36.0)
MCV: 95.6 fl (ref 78.0–100.0)
PLATELETS: 147 10*3/uL — AB (ref 150.0–400.0)
RBC: 4.71 Mil/uL (ref 4.22–5.81)
RDW: 13.7 % (ref 11.5–15.5)
WBC: 4.7 10*3/uL (ref 4.0–10.5)

## 2016-11-01 LAB — TSH: TSH: 11.59 u[IU]/mL — ABNORMAL HIGH (ref 0.35–4.50)

## 2016-11-01 LAB — T4, FREE: FREE T4: 0.91 ng/dL (ref 0.60–1.60)

## 2016-11-01 LAB — LDL CHOLESTEROL, DIRECT: LDL DIRECT: 148 mg/dL

## 2016-11-01 MED ORDER — LEVOTHYROXINE SODIUM 175 MCG PO TABS: 175.0000 ug | ORAL_TABLET | Freq: Every day | ORAL | 1 refills | Status: DC

## 2016-11-01 NOTE — Patient Instructions (Signed)
Give [place] 2-3 business days to get the results of your labs back. If labs are normal, you will likely receive a letter in the mail unless you have MyChart. This can take longer than 2-3 business days.   Try to get back into walking.  Clean up the diet.  Let us know if you need anything.

## 2016-11-01 NOTE — Progress Notes (Signed)
Chief Complaint  Patient presents with  . Follow-up    Subjective Arthur Daniel is a 76 y.o. male who presents for hypertension follow up. He does monitor home blood pressures. Blood pressures ranging from 110's/60-70's on average. He is not currently on any medications. He is not adhering to a healthy diet overall. Current exercise: was walking  Dyslipidemia Patient presents for dyslipidemia follow up. Compliance with treatment thus far has been good. Taking pravastatin every 2-3 days. He denies myalgias. He is not adhering to a low sodium and low fat diet. The patient exercises: never.   Hypothyroidism Patient presents for follow-up of hypothyroidism.  Reports compliance with medication. Current symptoms include: DOE that he had before they found his low thyroid.  Denies: weight gain, feeling cold and cold intolerance, constipation, swelling, anxiousness, sweating and weight loss He believes his dose should be unchanged    Past Medical History:  Diagnosis Date  . 10 year risk of MI or stroke 7.5% or greater 04/28/2016  . Allergic rhinitis, cause unspecified   . Atrial fibrillation ([place])     Associated with hyperthyroidism/Graves' disease  . BPH (benign prostatic hyperplasia) 10/18/2015  . Calculus of kidney   . Diverticulosis   . ED (erectile dysfunction)   . Essential hypertension, benign   . Hematuria, unspecified   . Hernia, umbilical   . History of chicken pox   . Lump or mass in breast   . Mixed hyperlipidemia   . Obesity   . Pacemaker    [place], [place] 2010  . Postablative hypothyroidism   . Sleep apnea    Use CPAP machine  . Ureteral stone    Dr. Risa Grill   Family History  Problem Relation Age of Onset  . CVA Mother   . Hypercholesterolemia Mother   . Goiter Mother   . Diabetes Mother        DM  . Hypertension Mother   . CAD Mother   . Cirrhosis Father        due to alcohol  . Cancer Sister        Breast  . Hypercholesterolemia Brother   .  Hypertension Brother   . CAD Brother   . Heart attack Son 40     Medications Current Outpatient Prescriptions on File Prior to Visit  Medication Sig Dispense Refill  . alfuzosin (UROXATRAL) 10 MG 24 hr tablet Take 10 mg by mouth at bedtime.     . ALPRAZolam (XANAX) 0.5 MG tablet Take 1 tablet by mouth daily as needed (ear discomfort).     Marland Kitchen aspirin 81 MG tablet Take 81 mg by mouth daily.    . Flaxseed, Linseed, (FLAX SEED OIL) 1300 MG CAPS Take 1,300 mg by mouth daily.    . Iron-FA-B Cmp-C-Biot-Probiotic (FUSION PLUS PO) Take 2 tablets by mouth daily.    Marland Kitchen levothyroxine (SYNTHROID, LEVOTHROID) 175 MCG tablet Take 175 mcg by mouth daily before breakfast.     . MYRBETRIQ 50 MG TB24 tablet Take 50 mg by mouth daily.     . NON FORMULARY C- PAP MACHINE    . OVER THE COUNTER MEDICATION Take 1 capsule by mouth daily. Amazing Grape Supplement    . oxyCODONE (OXY IR/ROXICODONE) 5 MG immediate release tablet Take 1 tablet (5 mg total) by mouth every 4 (four) hours as needed for moderate pain, severe pain or breakthrough pain. 30 tablet 0  . pravastatin (PRAVACHOL) 40 MG tablet Take 40 mg by mouth every 3 (three) days.  Causes breast tenderness 30 tablet   . UNABLE TO FIND Take 1 each by mouth daily. [place] herbal  tea     Allergies Allergies  Allergen Reactions  . Lipitor [Atorvastatin] Other (See Comments)    Breast lumps    Review of Systems Cardiovascular: no chest pain Respiratory:  no current shortness of breath  Exam BP 132/72 (BP Location: Left Arm, Patient Position: Sitting, Cuff Size: Large)   Pulse 67   Temp 98.3 F (36.8 C) (Oral)   Ht 6\' 1"  (1.854 m)   Wt (!) 302 lb 6 oz (137.2 kg)   SpO2 96%   BMI 39.89 kg/m  General:  well developed, well nourished, in no apparent distress Skin:  warm, no pallor or diaphoresis Eyes:  pupils equal and round, sclera anicteric without injection Heart :RRR, no bruits, +1 b/l pitting LE edema Lungs:  clear to auscultation, no accessory  muscle use Psych: well oriented with normal range of affect and appropriate judgment/insight  Essential hypertension - Plan: Comprehensive metabolic panel  10 year risk of MI or stroke 7.5% or greater - Plan: Lipid panel  Postablative hypothyroidism - Plan: TSH, T4, free  DOE (dyspnea on exertion) - Plan: CBC  Need for influenza vaccination - Plan: Flu vaccine HIGH DOSE PF (Fluzone High dose)  Orders as above.  Diet controlled HTN, no changes. Cont on intermittent dosing of pravastatin 2/2 myalgias as long as he is tolerating. Refill thyroid meds, recheck tsh. Check TSH, CBC, may be due to deconditioning.  Counseled on diet and exercise F/u in 6 mo. The patient voiced understanding and agreement to the plan.  Reid Hope King, DO 11/01/16  8:29 AM

## 2016-11-01 NOTE — Progress Notes (Signed)
Pre visit review using our clinic review tool, if applicable. No additional management support is needed unless otherwise documented below in the visit note. 

## 2016-12-28 ENCOUNTER — Ambulatory Visit (INDEPENDENT_AMBULATORY_CARE_PROVIDER_SITE_OTHER): Payer: BLUE CROSS/BLUE SHIELD | Admitting: *Deleted

## 2016-12-28 DIAGNOSIS — I495 Sick sinus syndrome: Secondary | ICD-10-CM

## 2016-12-28 NOTE — Progress Notes (Signed)
Remote pacemaker transmission.   

## 2017-01-02 ENCOUNTER — Encounter: Payer: Self-pay | Admitting: Cardiology

## 2017-01-11 DIAGNOSIS — Z9889 Other specified postprocedural states: Secondary | ICD-10-CM | POA: Diagnosis not present

## 2017-01-11 DIAGNOSIS — H9201 Otalgia, right ear: Secondary | ICD-10-CM | POA: Diagnosis not present

## 2017-01-11 DIAGNOSIS — M2669 Other specified disorders of temporomandibular joint: Secondary | ICD-10-CM | POA: Diagnosis not present

## 2017-01-19 LAB — CUP PACEART REMOTE DEVICE CHECK
Battery Remaining Percentage: 35 %
Battery Voltage: 2.83 V
Brady Statistic AP VP Percent: 1 %
Brady Statistic AS VS Percent: 74 %
Brady Statistic RA Percent Paced: 25 %
Brady Statistic RV Percent Paced: 1 %
Date Time Interrogation Session: 20181213072234
Implantable Lead Implant Date: 20101111
Implantable Lead Implant Date: 20101111
Implantable Pulse Generator Implant Date: 20101111
Lead Channel Impedance Value: 410 Ohm
Lead Channel Pacing Threshold Amplitude: 0.75 V
Lead Channel Pacing Threshold Pulse Width: 0.4 ms
Lead Channel Sensing Intrinsic Amplitude: 3.9 mV
Lead Channel Setting Pacing Amplitude: 2.5 V
Lead Channel Setting Sensing Sensitivity: 2 mV
MDC IDC LEAD LOCATION: 753859
MDC IDC LEAD LOCATION: 753860
MDC IDC MSMT BATTERY REMAINING LONGEVITY: 42 mo
MDC IDC MSMT LEADCHNL RA IMPEDANCE VALUE: 360 Ohm
MDC IDC MSMT LEADCHNL RV PACING THRESHOLD AMPLITUDE: 1 V
MDC IDC MSMT LEADCHNL RV PACING THRESHOLD PULSEWIDTH: 0.4 ms
MDC IDC MSMT LEADCHNL RV SENSING INTR AMPL: 8.2 mV
MDC IDC SET LEADCHNL RA PACING AMPLITUDE: 2 V
MDC IDC SET LEADCHNL RV PACING PULSEWIDTH: 0.4 ms
MDC IDC STAT BRADY AP VS PERCENT: 25 %
MDC IDC STAT BRADY AS VP PERCENT: 1 %
Pulse Gen Model: 2210
Pulse Gen Serial Number: 7079515

## 2017-01-26 DIAGNOSIS — G4733 Obstructive sleep apnea (adult) (pediatric): Secondary | ICD-10-CM | POA: Diagnosis not present

## 2017-03-13 ENCOUNTER — Encounter: Payer: Self-pay | Admitting: Medical

## 2017-03-13 ENCOUNTER — Ambulatory Visit (INDEPENDENT_AMBULATORY_CARE_PROVIDER_SITE_OTHER): Payer: Medicare HMO | Admitting: Medical

## 2017-03-13 ENCOUNTER — Ambulatory Visit (HOSPITAL_BASED_OUTPATIENT_CLINIC_OR_DEPARTMENT_OTHER)
Admission: RE | Admit: 2017-03-13 | Discharge: 2017-03-13 | Disposition: A | Discharge status: Home / Self Care | Payer: Medicare HMO | Source: Ambulatory Visit | Attending: Medical | Admitting: Medical

## 2017-03-13 VITALS — BP 137/69 | HR 71 | Temp 98.1°F | Resp 16 | Ht 73.0 in | Wt 296.9 lb

## 2017-03-13 DIAGNOSIS — R221 Localized swelling, mass and lump, neck: Secondary | ICD-10-CM

## 2017-03-13 DIAGNOSIS — M542 Cervicalgia: Secondary | ICD-10-CM | POA: Diagnosis not present

## 2017-03-13 DIAGNOSIS — J300 Vasomotor rhinitis: Secondary | ICD-10-CM

## 2017-03-13 DIAGNOSIS — R591 Generalized enlarged lymph nodes: Secondary | ICD-10-CM

## 2017-03-13 DIAGNOSIS — R6883 Chills (without fever): Secondary | ICD-10-CM

## 2017-03-13 DIAGNOSIS — Z87891 Personal history of nicotine dependence: Secondary | ICD-10-CM | POA: Diagnosis not present

## 2017-03-13 LAB — CBC WITH DIFFERENTIAL/PLATELET
BASOS ABS: 0 10*3/uL (ref 0.0–0.1)
Basophils Relative: 0.5 % (ref 0.0–3.0)
EOS ABS: 0.1 10*3/uL (ref 0.0–0.7)
Eosinophils Relative: 1.7 % (ref 0.0–5.0)
HCT: 46.9 % (ref 39.0–52.0)
Hemoglobin: 15.7 g/dL (ref 13.0–17.0)
LYMPHS ABS: 1.2 10*3/uL (ref 0.7–4.0)
LYMPHS PCT: 20.5 % (ref 12.0–46.0)
MCHC: 33.6 g/dL (ref 30.0–36.0)
MCV: 94.6 fl (ref 78.0–100.0)
Monocytes Absolute: 0.5 10*3/uL (ref 0.1–1.0)
Monocytes Relative: 7.9 % (ref 3.0–12.0)
NEUTROS PCT: 69.4 % (ref 43.0–77.0)
Neutro Abs: 4 10*3/uL (ref 1.4–7.7)
PLATELETS: 145 10*3/uL — AB (ref 150.0–400.0)
RBC: 4.96 Mil/uL (ref 4.22–5.81)
RDW: 13.8 % (ref 11.5–15.5)
WBC: 5.8 10*3/uL (ref 4.0–10.5)

## 2017-03-13 MED ORDER — AZELASTINE HCL 0.1 % NA SOLN: 2.0000 | Freq: Two times a day (BID) | NASAL | 12 refills | Status: DC

## 2017-03-13 MED ORDER — CEPHALEXIN 500 MG PO CAPS
500.0000 mg | ORAL_CAPSULE | Freq: Two times a day (BID) | ORAL | 0 refills | Status: DC
Start: 2017-03-13 — End: 2017-03-28

## 2017-03-13 MED ORDER — LEVOCETIRIZINE DIHYDROCHLORIDE 5 MG PO TABS: 5.0000 mg | ORAL_TABLET | Freq: Every evening | ORAL | 0 refills | Status: DC

## 2017-03-13 NOTE — Progress Notes (Signed)
Subjective:    Patient ID: Arthur Daniel, male    DOB: [birth year], 77 y.o.   MRN: [id number]  HPI  Pt in states he has been sick for about one month.  Pt has runny watery nose and eyes watery as well. Pt denies any drip/pnd. He states neck pain rt side of his neck when he swollows(pain has been consistent and present for weeks when he swallows.) He points to area adjacent to rt side thyroid.(Pt is on synthroid).  History of hyperthyroidism for treatment to ablate thyroid.  Then states he was put on thyroid medication. Pt has follow up with pcp in May 02, 2017.   No direct st on swallowing. He has been feeling chills over last 4 weeks. These chills area occurring random and most of time in evening.  Does have history of smoking.  No obvius fever or chills.  He states some fatigue.  In the past pt used xanax. Former pcp rx'd. Pt has sleep apnea. He takes xanax 2-3 a week.      Review of Systems  Constitutional: Positive for chills. Negative for fever.  HENT: Positive for rhinorrhea and voice change. Negative for congestion, facial swelling, mouth sores and postnasal drip.        Hx of hoarse avoid thyroglossal duct cyst for years. Surgery 9 years ago.  Eyes:       Watery eyes  Respiratory: Negative for cough, choking, chest tightness, shortness of breath and wheezing.   Cardiovascular: Negative for chest pain and palpitations.  Gastrointestinal: Negative for abdominal pain, anal bleeding, blood in stool and nausea.  Musculoskeletal: Negative for back pain and joint swelling.  Skin: Negative for rash.  Neurological: Negative for dizziness, speech difficulty, weakness, numbness and headaches.  Hematological: Positive for adenopathy.  Psychiatric/Behavioral: Negative for agitation and confusion.    Past Medical History:  Diagnosis Date  . 10 year risk of MI or stroke 7.5% or greater 04/28/2016  . Allergic rhinitis, cause unspecified   . Atrial fibrillation ([place])    Associated with hyperthyroidism/Graves' disease  . BPH (benign prostatic hyperplasia) 10/18/2015  . Calculus of kidney   . Diverticulosis   . ED (erectile dysfunction)   . Essential hypertension, benign   . Hematuria, unspecified   . Hernia, umbilical   . History of chicken pox   . Lump or mass in breast   . Mixed hyperlipidemia   . Obesity   . Pacemaker    [place], [place] 2010  . Postablative hypothyroidism   . Sleep apnea    Use CPAP machine  . Ureteral stone    Dr. Risa Grill     Social History   Socioeconomic History  . Marital status: Married    Spouse name: Not on file  . Number of children: Not on file  . Years of education: Not on file  . Highest education level: Not on file  Social Needs  . Financial resource strain: Not on file  . Food insecurity - worry: Not on file  . Food insecurity - inability: Not on file  . Transportation needs - medical: Not on file  . Transportation needs - non-medical: Not on file  Occupational History  . Not on file  Tobacco Use  . Smoking status: Former Smoker    Packs/day: 1.00    Years: 55.00    Pack years: 55.00    Types: Cigarettes    Last attempt to quit: 01/16/2006    Years since quitting: 11.1  .  Smokeless tobacco: Former Systems developer    Types: Chew    Quit date: 09/16/2013  . Tobacco comment: 1/2-1 pack a day  Substance and Sexual Activity  . Alcohol use: Yes    Comment: rarely drinks wine  . Drug use: No  . Sexual activity: Yes  Other Topics Concern  . Not on file  Social History Narrative  . Not on file    Past Surgical History:  Procedure Laterality Date  . ANTERIOR CRUCIATE LIGAMENT REPAIR Right 1994  . CATARACT EXTRACTION, BILATERAL  08/2010  . COLONOSCOPY  2001, 2008  . FLEXIBLE SIGMOIDOSCOPY  2001  . FOOT SURGERY Left 2005   Fot repair after chainsaw accident  . INSERTION OF MESH N/A 08/31/2016   Procedure: INSERTION OF MESH;  Surgeon: Jackolyn Confer, MD;  Location: [facility];  Service: General;  Laterality: N/A;  .  MENISCUS REPAIR Left 2004  . OTHER SURGICAL HISTORY  1995   Ear surgery due to mastoiditis  . PACEMAKER INSERTION  11/26/08   [name]  . SHOULDER ARTHROSCOPY WITH SUBACROMIAL DECOMPRESSION Left 10/20/2013   Procedure: LEFT SHOULDER SCOPE/DISTAL ACROMIOPLASTY, LABRAL DEBRIDMENT;  Surgeon: [name], MD;  Location: [facility];  Service: Orthopedics;  Laterality: Left;  . SHOULDER SURGERY Right 11/2002  . SHOULDER SURGERY Right 2011  . THYROGLOSSAL DUCT CYST    . TONSILLECTOMY AND ADENOIDECTOMY    . UMBILICAL HERNIA REPAIR N/A 08/31/2016   Procedure: UMBILICAL HERNIA REPAIR WITH MESH;  Surgeon: Jackolyn Confer, MD;  Location: [facility];  Service: General;  Laterality: N/A;  . VASECTOMY      Family History  Problem Relation Age of Onset  . CVA Mother   . Hypercholesterolemia Mother   . Goiter Mother   . Diabetes Mother        DM  . Hypertension Mother   . CAD Mother   . Cirrhosis Father        due to alcohol  . Cancer Sister        Breast  . Hypercholesterolemia Brother   . Hypertension Brother   . CAD Brother   . Heart attack Son 40    Allergies  Allergen Reactions  . Lipitor [Atorvastatin] Other (See Comments)    Breast lumps    Current Outpatient Medications on File Prior to Visit  Medication Sig Dispense Refill  . alfuzosin (UROXATRAL) 10 MG 24 hr tablet Take 10 mg by mouth at bedtime.     Marland Kitchen aspirin 81 MG tablet Take 81 mg by mouth daily.    . Flaxseed, Linseed, (FLAX SEED OIL) 1300 MG CAPS Take 1,300 mg by mouth daily.    . Iron-FA-B Cmp-C-Biot-Probiotic (FUSION PLUS PO) Take 2 tablets by mouth daily.    Marland Kitchen levothyroxine (SYNTHROID, LEVOTHROID) 175 MCG tablet Take 1 tablet (175 mcg total) by mouth daily before breakfast. 90 tablet 1  . MYRBETRIQ 50 MG TB24 tablet Take 50 mg by mouth daily.     . NON FORMULARY C- PAP MACHINE    . OVER THE COUNTER MEDICATION Take 1 capsule by mouth daily. Amazing Grape Supplement    . oxyCODONE (OXY IR/ROXICODONE) 5 MG  immediate release tablet Take 1 tablet (5 mg total) by mouth every 4 (four) hours as needed for moderate pain, severe pain or breakthrough pain. 30 tablet 0  . pravastatin (PRAVACHOL) 40 MG tablet Take 40 mg by mouth every 3 (three) days. Causes breast tenderness 30 tablet   . UNABLE TO FIND Take 1 each by  mouth daily. [place] herbal  tea     No current facility-administered medications on file prior to visit.     BP 137/69   Pulse 71   Temp 98.1 F (36.7 C) (Oral)   Resp 16   Ht 6\' 1"  (1.854 m)   Wt 296 lb 14.4 oz (134.7 kg)   SpO2 97%   BMI 39.17 kg/m       Objective:   Physical Exam  General  Mental Status - Alert. General Appearance - Well groomed. Not in acute distress.  Skin Rashes- No Rashes.  HEENT Head- Normal. Ear Auditory Canal - Left- Normal. Right - Normal.Tympanic Membrane- Left- Normal. Right- Normal. Eye Sclera/Conjunctiva- Left- Normal. Right- Normal. Nose & Sinuses Nasal Mucosa- Left-  Boggy and Congested. Right-  Boggy and  Congested.Bilateral  No maxillary and no  frontal sinus pressure. Mouth & Throat Lips: Upper Lip- Normal: no dryness, cracking, pallor, cyanosis, or vesicular eruption. Lower Lip-Normal: no dryness, cracking, pallor, cyanosis or vesicular eruption. Buccal Mucosa- Bilateral- No Aphthous ulcers. Oropharynx- No Discharge or Erythema. Tonsils: Characteristics- Bilateral- No Erythema or Congestion. Size/Enlargement- Bilateral- No enlargement. Discharge- bilateral-None.  Neck Neck- Supple.  Good range of motion.  However he reports pain medial to the base of the sternocleidomastoid right side.  At the level of his thyroid.  When he swallows the pain is more significant.  Possible small lymph node versus mass in this area.  Difficult to differentiate.  He has a thick neck.   Chest and Lung Exam Auscultation: Breath Sounds:-Clear even and unlabored.  Cardiovascular Auscultation:Rythm- Regular, rate and rhythm. Murmurs & Other Heart  Sounds:Ausculatation of the heart reveal- No Murmurs.  Lymphatic Head & Neck General Head & Neck Lymphatics: Bilateral: Description-see neck exam.  Unclear if lymph node swollen.       Assessment & Plan:  For your 4-week history of runny nose and watery eyes, I am going to prescribe you Astelin nasal spray and Xyzal.  You may have recent vasomotor rhinitis type symptoms versus mild allergies.  For your recent chills intermittently for a month and mildly tender area at the base of your neck right side, I am going to order CBC and get ultrasound of the neck.  On exam the neck region feels like a lymph node might be mildly swollen for possible lump present.  In light of the chills and possible lump, I do want to go ahead and give the Keflex antibiotic to see if this area decreases in size.  We will call you on the labs when those results are back.  Please go downstairs and see when they can get you scheduled for the neck ultrasound.  No he does have a long history of smoking and history of hoarse voice but that has been for years.  He states hoarse voice occurred after surgery.  If unclear with above workup might consider ENT referral.  Follow-up 10-14 days or as needed.  Mackie Pai, PA-C

## 2017-03-13 NOTE — Patient Instructions (Addendum)
For your 4-week history of runny nose and watery eyes, I am going to prescribe you Astelin nasal spray and Xyzal.  You may have recent vasomotor rhinitis type symptoms versus mild allergies.  For your recent chills intermittently for a month and mildly tender area at the base of your neck right side, I am going to order CBC and get ultrasound of the neck.  On exam the neck region feels like a lymph node might be mildly swollen or possible lump present.  In light of the chills and possible lump, I do want to go ahead and give the Keflex antibiotic to see if this area decreases in size.  We will call you on the labs when those results are back.  Please go downstairs and see when they can get you scheduled for the neck ultrasound.  Follow-up 10-14 days or as needed.

## 2017-03-15 DIAGNOSIS — J392 Other diseases of pharynx: Secondary | ICD-10-CM | POA: Diagnosis not present

## 2017-03-15 DIAGNOSIS — H9201 Otalgia, right ear: Secondary | ICD-10-CM | POA: Diagnosis not present

## 2017-03-15 DIAGNOSIS — J029 Acute pharyngitis, unspecified: Secondary | ICD-10-CM | POA: Diagnosis not present

## 2017-03-28 ENCOUNTER — Encounter: Payer: Self-pay | Admitting: Family Medicine

## 2017-03-28 ENCOUNTER — Ambulatory Visit (INDEPENDENT_AMBULATORY_CARE_PROVIDER_SITE_OTHER): Payer: Medicare HMO | Admitting: Family Medicine

## 2017-03-28 VITALS — BP 128/86 | HR 78 | Temp 98.0°F | Ht 73.0 in | Wt 293.5 lb

## 2017-03-28 DIAGNOSIS — J301 Allergic rhinitis due to pollen: Secondary | ICD-10-CM

## 2017-03-28 MED ORDER — METHYLPREDNISOLONE ACETATE 80 MG/ML IJ SUSP
80.0000 mg | Freq: Once | INTRAMUSCULAR | Status: AC
  Administered 2017-03-28: 80 mg via INTRAMUSCULAR

## 2017-03-28 MED ORDER — FLUTICASONE PROPIONATE 50 MCG/ACT NA SUSP: 2.0000 | Freq: Every day | NASAL | 2 refills | Status: DC

## 2017-03-28 NOTE — Patient Instructions (Addendum)
Flonase (fluticasone); nasal spray that is over the counter. 2 sprays each nostril, once daily. Aim towards the same side eye when you spray.  You can continue the other nasal spray as well.   Continue the Xyzal daily.  Let us know if you need anything.

## 2017-03-28 NOTE — Progress Notes (Signed)
Pre visit review using our clinic review tool, if applicable. No additional management support is needed unless otherwise documented below in the visit note. 

## 2017-03-28 NOTE — Addendum Note (Signed)
Addended by: Sharon Seller B on: 03/28/2017 11:29 AM   Modules accepted: Orders

## 2017-03-28 NOTE — Progress Notes (Signed)
Chief Complaint  Patient presents with  . Cough    sneezing and nasal congestion    Arthur Daniel here for URI complaints.  Duration: 1 month  Associated symptoms: sinus congestion, rhinorrhea, itchy watery eyes and sneezing Denies: sinus pain, ear pain, ear drainage, sore throat, wheezing, shortness of breath and myalgia Treatment to date: Xyzal, Asteline nasal spray Sick contacts: No  ROS:  Const: Denies fevers HEENT: As noted in HPI Lungs: No SOB  Past Medical History:  Diagnosis Date  . 10 year risk of MI or stroke 7.5% or greater 04/28/2016  . Allergic rhinitis, cause unspecified   . Atrial fibrillation ([place])     Associated with hyperthyroidism/Graves' disease  . BPH (benign prostatic hyperplasia) 10/18/2015  . Calculus of kidney   . Diverticulosis   . ED (erectile dysfunction)   . Essential hypertension, benign   . Hematuria, unspecified   . Hernia, umbilical   . History of chicken pox   . Lump or mass in breast   . Mixed hyperlipidemia   . Obesity   . Pacemaker    [place], [place] 2010  . Postablative hypothyroidism   . Sleep apnea    Use CPAP machine  . Ureteral stone    Dr. Risa Grill     BP 128/86 (BP Location: Left Arm, Patient Position: Sitting, Cuff Size: Large)   Pulse 78   Temp 98 F (36.7 C) (Oral)   Ht 6\' 1"  (1.854 m)   Wt 293 lb 8 oz (133.1 kg)   SpO2 94%   BMI 38.72 kg/m  General: Awake, alert, appears stated age HEENT: Eyes are injected b/l, AT, [place], ears patent b/l and TM's neg, nares patent, turbinates swollen b/l and pale, pharynx pink and without exudates, MMM Neck: No masses or asymmetry Heart: RRR Lungs: CTAB, no accessory muscle use Psych: Age appropriate judgment and insight, normal mood and affect  Seasonal allergic rhinitis due to pollen - Plan: fluticasone (FLONASE) 50 MCG/ACT nasal spray  Orders as above. Add INCS, cont PO antihistamine. Steroid injection to help immediate symptoms. F/u prn. If starting to experience fevers,  shaking, or shortness of breath, seek immediate care. Pt voiced understanding and agreement to the plan.  [place], DO 03/28/17 11:22 AM

## 2017-03-29 ENCOUNTER — Ambulatory Visit (INDEPENDENT_AMBULATORY_CARE_PROVIDER_SITE_OTHER): Payer: Medicare HMO | Admitting: *Deleted

## 2017-03-29 DIAGNOSIS — I495 Sick sinus syndrome: Secondary | ICD-10-CM

## 2017-03-29 NOTE — Progress Notes (Signed)
Remote pacemaker transmission.   

## 2017-03-30 ENCOUNTER — Encounter: Payer: Self-pay | Admitting: Cardiology

## 2017-04-04 ENCOUNTER — Encounter: Payer: Self-pay | Admitting: Internal Medicine

## 2017-04-04 ENCOUNTER — Ambulatory Visit: Payer: Medicare HMO | Admitting: Internal Medicine

## 2017-04-04 VITALS — BP 136/64 | HR 82 | Ht 73.0 in | Wt 297.0 lb

## 2017-04-04 DIAGNOSIS — Z950 Presence of cardiac pacemaker: Secondary | ICD-10-CM

## 2017-04-04 DIAGNOSIS — I480 Paroxysmal atrial fibrillation: Secondary | ICD-10-CM

## 2017-04-04 DIAGNOSIS — I495 Sick sinus syndrome: Secondary | ICD-10-CM | POA: Diagnosis not present

## 2017-04-04 LAB — CUP PACEART INCLINIC DEVICE CHECK
Battery Remaining Longevity: 46 mo
Brady Statistic RA Percent Paced: 25 %
Brady Statistic RV Percent Paced: 0.53 %
Date Time Interrogation Session: 20190320140335
Implantable Lead Implant Date: 20101111
Implantable Lead Implant Date: 20101111
Implantable Lead Location: 753859
Implantable Lead Location: 753860
Implantable Pulse Generator Implant Date: 20101111
Lead Channel Impedance Value: 337.5 Ohm
Lead Channel Impedance Value: 375 Ohm
Lead Channel Pacing Threshold Amplitude: 1 V
Lead Channel Pacing Threshold Amplitude: 1.25 V
Lead Channel Pacing Threshold Pulse Width: 0.4 ms
Lead Channel Pacing Threshold Pulse Width: 0.4 ms
Lead Channel Pacing Threshold Pulse Width: 0.4 ms
Lead Channel Sensing Intrinsic Amplitude: 4.4 mV
Lead Channel Setting Pacing Amplitude: 2.5 V
MDC IDC MSMT BATTERY VOLTAGE: 2.83 V
MDC IDC MSMT LEADCHNL RA PACING THRESHOLD AMPLITUDE: 1 V
MDC IDC MSMT LEADCHNL RA PACING THRESHOLD PULSEWIDTH: 0.4 ms
MDC IDC MSMT LEADCHNL RV PACING THRESHOLD AMPLITUDE: 1.25 V
MDC IDC MSMT LEADCHNL RV SENSING INTR AMPL: 11.1 mV
MDC IDC PG SERIAL: 7079515
MDC IDC SET LEADCHNL RA PACING AMPLITUDE: 2 V
MDC IDC SET LEADCHNL RV PACING PULSEWIDTH: 0.4 ms
MDC IDC SET LEADCHNL RV SENSING SENSITIVITY: 2 mV

## 2017-04-04 NOTE — Patient Instructions (Addendum)
Medication Instructions:  Your physician has recommended you make the following change in your medication:   Stop Aspirin   Labwork: None ordered.  Testing/Procedures: None ordered.  Follow-Up: Your physician recommends that you schedule a follow-up appointment in: 12 months with Dr [name]  Remote monitoring is used to monitor your Pacemaker from home. This monitoring reduces the number of office visits required to check your device to one time per year. It allows [place] to keep an eye on the functioning of your device to ensure it is working properly. You are scheduled for a device check from home on 07/04/2017. You may send your transmission at any time that day. If you have a wireless device, the transmission will be sent automatically. After your physician reviews your transmission, you will receive a postcard with your next transmission date.    Any Other Special Instructions Will Be Listed Below (If Applicable).     If you need a refill on your cardiac medications before your next appointment, please call your pharmacy.

## 2017-04-04 NOTE — Progress Notes (Signed)
Patient Care Team: Shelda Pal, DO as PCP - General (Family Medicine) Hollar, Katharine Look, MD as Referring Physician (Dermatology) Sharol Given, MD as Referring Physician (Otolaryngology) Alyson Ingles Candee Furbish, MD as Consulting Physician (Urology) Frederik Pear, MD as Consulting Physician (Orthopedic Surgery) Charlyn Minerva, [place] (Internal Medicine) Deboraha Sprang, MD as Consulting Physician (Cardiology)   HPI  Arthur Daniel is a 77 y.o. male Seen in follow-up for pacemaker implantation for symptomatic bradycardia associated with sinus node dysfunction  He has a history of atrial fibrillation with a CHADS-VASc score is 2 for age and hypertension  however, his atrial fibrillation occurred in the context of hyperthyroidism/Graves' disease which is now quiescent. He is treated currently with aspirin.    TSH 10/17 3.5  Has treated sleep apnea.  Machine is monitored carefully.  Denies chest pain shortness of breath lightheadedness.  Has occasional peripheral edema.  He continues to work  and rebuild cards.  His most recent acquisition is a [place] in [place] condition     DATE TEST    2010/     Echo   EF 55-60 %   6/15    Myoview   EF 61 % Low risk, small apical ischemia           Past Medical History:  Diagnosis Date  . 10 year risk of MI or stroke 7.5% or greater 04/28/2016  . Allergic rhinitis, cause unspecified   . Atrial fibrillation ([place])     Associated with hyperthyroidism/Graves' disease  . BPH (benign prostatic hyperplasia) 10/18/2015  . Calculus of kidney   . Diverticulosis   . ED (erectile dysfunction)   . Essential hypertension, benign   . Hematuria, unspecified   . Hernia, umbilical   . History of chicken pox   . Lump or mass in breast   . Mixed hyperlipidemia   . Obesity   . Pacemaker    [name], [name]  . Postablative hypothyroidism   . Sleep apnea    Use CPAP machine  . Ureteral stone    Dr. Risa Grill    Past  Surgical History:  Procedure Laterality Date  . ANTERIOR CRUCIATE LIGAMENT REPAIR Right 1994  . CATARACT EXTRACTION, BILATERAL  08/2010  . COLONOSCOPY  2001, 2008  . FLEXIBLE SIGMOIDOSCOPY  2001  . FOOT SURGERY Left 2005   Fot repair after chainsaw accident  . INSERTION OF MESH N/A 08/31/2016   Procedure: INSERTION OF MESH;  Surgeon: Jackolyn Confer, MD;  Location: [facility];  Service: General;  Laterality: N/A;  . MENISCUS REPAIR Left 2004  . OTHER SURGICAL HISTORY  1995   Ear surgery due to mastoiditis  . PACEMAKER INSERTION  11/26/08   [name]  . SHOULDER ARTHROSCOPY WITH SUBACROMIAL DECOMPRESSION Left 10/20/2013   Procedure: LEFT SHOULDER SCOPE/DISTAL ACROMIOPLASTY, LABRAL DEBRIDMENT;  Surgeon: [name], MD;  Location: [facility];  Service: Orthopedics;  Laterality: Left;  . SHOULDER SURGERY Right 11/2002  . SHOULDER SURGERY Right 2011  . THYROGLOSSAL DUCT CYST    . TONSILLECTOMY AND ADENOIDECTOMY    . UMBILICAL HERNIA REPAIR N/A 08/31/2016   Procedure: UMBILICAL HERNIA REPAIR WITH MESH;  Surgeon: Jackolyn Confer, MD;  Location: [facility];  Service: General;  Laterality: N/A;  . VASECTOMY      Current Outpatient Medications  Medication Sig Dispense Refill  . alfuzosin (UROXATRAL) 10 MG 24 hr tablet Take 10 mg by mouth at bedtime.     Marland Kitchen aspirin  81 MG tablet Take 81 mg by mouth daily.    . Flaxseed, Linseed, (FLAX SEED OIL) 1300 MG CAPS Take 1,300 mg by mouth daily.    . fluticasone (FLONASE) 50 MCG/ACT nasal spray Place 2 sprays into both nostrils daily. 16 g 2  . levocetirizine (XYZAL) 5 MG tablet Take 1 tablet (5 mg total) by mouth every evening. 30 tablet 0  . levothyroxine (SYNTHROID, LEVOTHROID) 175 MCG tablet Take 1 tablet (175 mcg total) by mouth daily before breakfast. 90 tablet 1  . MYRBETRIQ 50 MG TB24 tablet Take 50 mg by mouth daily.     . NON FORMULARY C- PAP MACHINE    . OVER THE COUNTER MEDICATION Take 1 capsule by mouth daily. Amazing Grape Supplement     . pravastatin (PRAVACHOL) 40 MG tablet Take 40 mg by mouth every 3 (three) days. Causes breast tenderness 30 tablet   . UNABLE TO FIND Take 1 each by mouth daily. [place] herbal  tea     No current facility-administered medications for this visit.     Allergies  Allergen Reactions  . Lipitor [Atorvastatin] Other (See Comments)    Breast lumps    Review of Systems negative except from HPI and PMH  Physical Exam BP 136/64   Pulse 82   Ht 6\' 1"  (1.854 m)   Wt 297 lb (134.7 kg)   SpO2 96%   BMI 39.18 kg/m  Well developed and nourished in no acute distress HENT normal Neck supple with JVP-flat Clear Regular rate and rhythm, no murmurs or gallops Abd-soft with active BS No Clubbing cyanosis tr edema Skin-warm and dry A & Oriented  Grossly normal sensory and motor function    ECG sinus rhythm at 85 Intervals 20/09/37 Inferior Q waves Unchanged   Assessment and  Plan  Sinus node dysfunction     Pacemaker-[employer] The patient's device was interrogated.  The information was reviewed. No changes were made in the programming.     Atrial fibrillation     Hypertension  Morbidly obese   Blood pressure well controlled  No intercurrent atrial fibrillation or flutter  Will stop asa  Encouraged exercise

## 2017-04-09 ENCOUNTER — Other Ambulatory Visit: Payer: Self-pay | Admitting: Medical

## 2017-04-15 LAB — CUP PACEART REMOTE DEVICE CHECK
Brady Statistic AP VP Percent: 1 %
Brady Statistic AP VS Percent: 26 %
Brady Statistic AS VS Percent: 74 %
Brady Statistic RV Percent Paced: 1 %
Implantable Lead Implant Date: 20101111
Implantable Lead Location: 753860
Lead Channel Impedance Value: 400 Ohm
Lead Channel Pacing Threshold Amplitude: 0.75 V
Lead Channel Pacing Threshold Amplitude: 1 V
Lead Channel Pacing Threshold Pulse Width: 0.4 ms
Lead Channel Sensing Intrinsic Amplitude: 4.2 mV
Lead Channel Setting Pacing Amplitude: 2.5 V
Lead Channel Setting Pacing Pulse Width: 0.4 ms
Lead Channel Setting Sensing Sensitivity: 2 mV
MDC IDC LEAD IMPLANT DT: 20101111
MDC IDC LEAD LOCATION: 753859
MDC IDC MSMT BATTERY REMAINING LONGEVITY: 35 mo
MDC IDC MSMT BATTERY REMAINING PERCENTAGE: 29 %
MDC IDC MSMT BATTERY VOLTAGE: 2.81 V
MDC IDC MSMT LEADCHNL RA IMPEDANCE VALUE: 350 Ohm
MDC IDC MSMT LEADCHNL RA PACING THRESHOLD PULSEWIDTH: 0.4 ms
MDC IDC MSMT LEADCHNL RV SENSING INTR AMPL: 10.3 mV
MDC IDC PG IMPLANT DT: 20101111
MDC IDC PG SERIAL: 7079515
MDC IDC SESS DTM: 20190314075625
MDC IDC SET LEADCHNL RA PACING AMPLITUDE: 2 V
MDC IDC STAT BRADY AS VP PERCENT: 1 %
MDC IDC STAT BRADY RA PERCENT PACED: 25 %

## 2017-05-02 ENCOUNTER — Ambulatory Visit (INDEPENDENT_AMBULATORY_CARE_PROVIDER_SITE_OTHER): Payer: Medicare HMO | Admitting: Family Medicine

## 2017-05-02 ENCOUNTER — Encounter: Payer: Self-pay | Admitting: Family Medicine

## 2017-05-02 VITALS — BP 130/78 | HR 71 | Temp 98.4°F | Ht 73.0 in | Wt 296.0 lb

## 2017-05-02 DIAGNOSIS — E890 Postprocedural hypothyroidism: Secondary | ICD-10-CM

## 2017-05-02 DIAGNOSIS — I10 Essential (primary) hypertension: Secondary | ICD-10-CM

## 2017-05-02 DIAGNOSIS — J301 Allergic rhinitis due to pollen: Secondary | ICD-10-CM

## 2017-05-02 DIAGNOSIS — R7303 Prediabetes: Secondary | ICD-10-CM | POA: Diagnosis not present

## 2017-05-02 DIAGNOSIS — Z9189 Other specified personal risk factors, not elsewhere classified: Secondary | ICD-10-CM

## 2017-05-02 LAB — COMPREHENSIVE METABOLIC PANEL
ALT: 20 U/L (ref 0–53)
AST: 17 U/L (ref 0–37)
Albumin: 4.1 g/dL (ref 3.5–5.2)
Alkaline Phosphatase: 34 U/L — ABNORMAL LOW (ref 39–117)
BILIRUBIN TOTAL: 0.5 mg/dL (ref 0.2–1.2)
BUN: 14 mg/dL (ref 6–23)
CO2: 25 mEq/L (ref 19–32)
CREATININE: 0.97 mg/dL (ref 0.40–1.50)
Calcium: 8.8 mg/dL (ref 8.4–10.5)
Chloride: 104 mEq/L (ref 96–112)
GFR: 80.33 mL/min (ref >60.00)
GLUCOSE: 121 mg/dL — AB (ref 70–99)
Potassium: 3.9 mEq/L (ref 3.5–5.1)
SODIUM: 139 meq/L (ref 135–145)
Total Protein: 6.8 g/dL (ref 6.0–8.3)

## 2017-05-02 LAB — LIPID PANEL
CHOLESTEROL: 234 mg/dL — AB (ref 0–200)
HDL: 43.8 mg/dL (ref >39.00)
LDL Cholesterol: 164 mg/dL — ABNORMAL HIGH (ref 0–99)
NONHDL: 190.53
Total CHOL/HDL Ratio: 5
Triglycerides: 133 mg/dL (ref 0.0–149.0)
VLDL: 26.6 mg/dL (ref 0.0–40.0)

## 2017-05-02 LAB — HEMOGLOBIN A1C: Hgb A1c MFr Bld: 6.1 % (ref 4.6–6.5)

## 2017-05-02 MED ORDER — LEVOCETIRIZINE DIHYDROCHLORIDE 5 MG PO TABS: ORAL_TABLET | ORAL | 3 refills | Status: DC

## 2017-05-02 MED ORDER — FLUTICASONE PROPIONATE 50 MCG/ACT NA SUSP: 2.0000 | Freq: Every day | NASAL | 3 refills | Status: DC

## 2017-05-02 NOTE — Progress Notes (Signed)
Chief Complaint  Patient presents with  . Follow-up    allergies    Subjective Arthur Daniel is a 77 y.o. male who presents for hypertension follow up. He is diet controlled. Patient has these side effects of medication: none He is adhering to a healthy diet overall. Current exercise: walking 1 mi/d  Hyperlipidemia Patient presents for dyslipidemia follow up. Currently being treated with pravastatin 40 mg 2-3x/week and compliance with treatment thus far has been good. He denies myalgias while on this dosing. He is adhering to a healthy. The patient exercises daily.  The patient is known to have coexisting coronary artery disease.  Hypothyroidism Patient presents for follow-up of hypothyroidism.  Reports compliance with medication. Current symptoms include: none Denies: denies fatigue, weight changes, heat/cold intolerance, bowel/skin changes or CVS symptoms  Allergies got better with INCS and Xyzal, but he is out. Pollen is a trigger.    Past Medical History:  Diagnosis Date  . 10 year risk of MI or stroke 7.5% or greater 04/28/2016  . Allergic rhinitis, cause unspecified   . Atrial fibrillation ([place])     Associated with hyperthyroidism/Graves' disease  . BPH (benign prostatic hyperplasia) 10/18/2015  . Calculus of kidney   . Diverticulosis   . ED (erectile dysfunction)   . Essential hypertension, benign   . Hematuria, unspecified   . Hernia, umbilical   . History of chicken pox   . Lump or mass in breast   . Mixed hyperlipidemia   . Obesity   . Pacemaker    [place], [place] 2010  . Postablative hypothyroidism   . Sleep apnea    Use CPAP machine  . Ureteral stone    Dr. Risa Grill   Family History  Problem Relation Age of Onset  . CVA Mother   . Hypercholesterolemia Mother   . Goiter Mother   . Diabetes Mother        DM  . Hypertension Mother   . CAD Mother   . Cirrhosis Father        due to alcohol  . Cancer Sister        Breast  . Hypercholesterolemia  Brother   . Hypertension Brother   . CAD Brother   . Heart attack Son 40    Medications Current Outpatient Medications on File Prior to Visit  Medication Sig Dispense Refill  . alfuzosin (UROXATRAL) 10 MG 24 hr tablet Take 10 mg by mouth at bedtime.     . Flaxseed, Linseed, (FLAX SEED OIL) 1300 MG CAPS Take 1,300 mg by mouth daily.    . fluticasone (FLONASE) 50 MCG/ACT nasal spray Place 2 sprays into both nostrils daily. 16 g 2  . levocetirizine (XYZAL) 5 MG tablet TAKE 1 TABLET BY MOUTH EVERY DAY IN THE EVENING 30 tablet 0  . levothyroxine (SYNTHROID, LEVOTHROID) 175 MCG tablet Take 1 tablet (175 mcg total) by mouth daily before breakfast. 90 tablet 1  . MYRBETRIQ 50 MG TB24 tablet Take 50 mg by mouth daily.     . NON FORMULARY C- PAP MACHINE    . OVER THE COUNTER MEDICATION Take 1 capsule by mouth daily. Amazing Grape Supplement    . pravastatin (PRAVACHOL) 40 MG tablet Take 40 mg by mouth every 3 (three) days. Causes breast tenderness 30 tablet   . UNABLE TO FIND Take 1 each by mouth daily. [place] herbal  tea     Allergies Allergies  Allergen Reactions  . Lipitor [Atorvastatin] Other (See Comments)  Breast lumps    Review of Systems Cardiovascular: no chest pain Respiratory:  no shortness of breath  Exam BP 130/78 (BP Location: Left Arm, Patient Position: Sitting, Cuff Size: Large)   Pulse 71   Temp 98.4 F (36.9 C) (Oral)   Ht 6\' 1"  (1.854 m)   Wt 296 lb (134.3 kg)   SpO2 94%   BMI 39.05 kg/m  General:  well developed, well nourished, in no apparent distress Skin: warm, no pallor or diaphoresis Eyes: pupils equal and round, sclera anicteric without injection Heart: RRR, no bruits, +1+ pitting b/l LE edema Lungs: clear to auscultation, no accessory muscle use Psych: well oriented with normal range of affect and appropriate judgment/insight  10 year risk of MI or stroke 7.5% or greater - Plan: Lipid panel  Seasonal allergic rhinitis due to pollen - Plan:  levocetirizine (XYZAL) 5 MG tablet, fluticasone (FLONASE) 50 MCG/ACT nasal spray  Postablative hypothyroidism  Essential hypertension - Plan: Comprehensive metabolic panel  Prediabetes - Plan: Hemoglobin A1c  Orders as above. Counseled on diet and exercise, might need to switch up exercises as he does the same thing daily.  F/u in 6 mo for CPE. The patient voiced understanding and agreement to the plan.  [place], DO 05/02/17  8:21 AM

## 2017-05-02 NOTE — Progress Notes (Signed)
Pre visit review using our clinic review tool, if applicable. No additional management support is needed unless otherwise documented below in the visit note. 

## 2017-05-02 NOTE — Patient Instructions (Addendum)
Get back to drinking lots of water.  Try to keep the diet clean.  Stay active.  1-2 days to get the results of your labs back.  Let us know if you need anything.

## 2017-05-08 DIAGNOSIS — G4733 Obstructive sleep apnea (adult) (pediatric): Secondary | ICD-10-CM | POA: Diagnosis not present

## 2017-06-12 ENCOUNTER — Encounter: Payer: Self-pay | Admitting: Internal Medicine

## 2017-06-12 ENCOUNTER — Ambulatory Visit (INDEPENDENT_AMBULATORY_CARE_PROVIDER_SITE_OTHER): Payer: Medicare HMO | Admitting: Internal Medicine

## 2017-06-12 VITALS — BP 124/62 | HR 74 | Temp 97.3°F | Resp 16 | Ht 73.0 in | Wt 295.1 lb

## 2017-06-12 DIAGNOSIS — M79672 Pain in left foot: Secondary | ICD-10-CM

## 2017-06-12 NOTE — Progress Notes (Signed)
Pre visit review using our clinic review tool, if applicable. No additional management support is needed unless otherwise documented below in the visit note. 

## 2017-06-12 NOTE — Progress Notes (Signed)
Subjective:    Patient ID: Arthur Daniel, male    DOB: 14-Aug-1940, 77 y.o.   MRN: [id number]  DOS:  06/12/2017 Type of visit - description : Acute visit Interval history: Symptoms of started a month ago: Severe pain at the left heel when he walks. Has been taking occasional Advil, putting ice without much help. Has used a number of OTC creams without relief High cholesterol: Not taking Pravachol   Review of Systems No recent injury although reports a trauma at the left midfoot years ago.  Status post surgical repair by Ortho. There is no redness or swelling  Past Medical History:  Diagnosis Date  . 10 year risk of MI or stroke 7.5% or greater 04/28/2016  . Allergic rhinitis, cause unspecified   . Atrial fibrillation ([place])     Associated with hyperthyroidism/Graves' disease  . BPH (benign prostatic hyperplasia) 10/18/2015  . Calculus of kidney   . Diverticulosis   . ED (erectile dysfunction)   . Essential hypertension, benign   . Hematuria, unspecified   . Hernia, umbilical   . History of chicken pox   . Lump or mass in breast   . Mixed hyperlipidemia   . Obesity   . Pacemaker    [place], [place] 2010  . Postablative hypothyroidism   . Sleep apnea    Use CPAP machine  . Ureteral stone    Dr. Risa Grill    Past Surgical History:  Procedure Laterality Date  . ANTERIOR CRUCIATE LIGAMENT REPAIR Right 1994  . CATARACT EXTRACTION, BILATERAL  08/2010  . COLONOSCOPY  2001, 2008  . FLEXIBLE SIGMOIDOSCOPY  2001  . FOOT SURGERY Left 2005   Fot repair after chainsaw accident  . INSERTION OF MESH N/A 08/31/2016   Procedure: INSERTION OF MESH;  Surgeon: Jackolyn Confer, MD;  Location: [facility];  Service: General;  Laterality: N/A;  . MENISCUS REPAIR Left 2004  . OTHER SURGICAL HISTORY  1995   Ear surgery due to mastoiditis  . PACEMAKER INSERTION  11/26/08   [name]  . SHOULDER ARTHROSCOPY WITH SUBACROMIAL DECOMPRESSION Left 10/20/2013   Procedure: LEFT SHOULDER SCOPE/DISTAL  ACROMIOPLASTY, LABRAL DEBRIDMENT;  Surgeon: [name], MD;  Location: [facility];  Service: Orthopedics;  Laterality: Left;  . SHOULDER SURGERY Right 11/2002  . SHOULDER SURGERY Right 2011  . THYROGLOSSAL DUCT CYST    . TONSILLECTOMY AND ADENOIDECTOMY    . UMBILICAL HERNIA REPAIR N/A 08/31/2016   Procedure: UMBILICAL HERNIA REPAIR WITH MESH;  Surgeon: Jackolyn Confer, MD;  Location: [facility];  Service: General;  Laterality: N/A;  . VASECTOMY      Social History   Socioeconomic History  . Marital status: Married    Spouse name: Not on file  . Number of children: Not on file  . Years of education: Not on file  . Highest education level: Not on file  Occupational History  . Not on file  Social Needs  . Financial resource strain: Not on file  . Food insecurity:    Worry: Not on file    Inability: Not on file  . Transportation needs:    Medical: Not on file    Non-medical: Not on file  Tobacco Use  . Smoking status: Former Smoker    Packs/day: 1.00    Years: 55.00    Pack years: 55.00    Types: Cigarettes    Last attempt to quit: 01/16/2006    Years since quitting: 11.4  . Smokeless tobacco: Former Systems developer  Types: Sarina Ser    Quit date: 09/16/2013  . Tobacco comment: 1/2-1 pack a day  Substance and Sexual Activity  . Alcohol use: Yes    Comment: rarely drinks wine  . Drug use: No  . Sexual activity: Yes  Lifestyle  . Physical activity:    Days per week: Not on file    Minutes per session: Not on file  . Stress: Not on file  Relationships  . Social connections:    Talks on phone: Not on file    Gets together: Not on file    Attends religious service: Not on file    Active member of club or organization: Not on file    Attends meetings of clubs or organizations: Not on file    Relationship status: Not on file  . Intimate partner violence:    Fear of current or ex partner: Not on file    Emotionally abused: Not on file    Physically abused: Not on file     Forced sexual activity: Not on file  Other Topics Concern  . Not on file  Social History Narrative  . Not on file      Allergies as of 06/12/2017      Reactions   Lipitor [atorvastatin] Other (See Comments)   Breast lumps      Medication List        Accurate as of 06/12/17 11:59 PM. Always use your most recent med list.          alfuzosin 10 MG 24 hr tablet Commonly known as:  UROXATRAL Take 10 mg by mouth at bedtime.   Fish Oil 1000 MG Cpdr Take 1,000 mg by mouth daily.   Flax Seed Oil 1300 MG Caps Take 1,300 mg by mouth daily.   levothyroxine 175 MCG tablet Commonly known as:  SYNTHROID, LEVOTHROID Take 1 tablet (175 mcg total) by mouth daily before breakfast.   MYRBETRIQ 50 MG Tb24 tablet Generic drug:  mirabegron ER Take 50 mg by mouth daily.   NON FORMULARY C- PAP MACHINE   OVER THE COUNTER MEDICATION Take 1 capsule by mouth daily. Amazing Grape Supplement          Objective:   Physical Exam  Musculoskeletal:       Feet:   BP 124/62 (BP Location: Left Arm, Patient Position: Sitting, Cuff Size: Normal)   Pulse 74   Temp (!) 97.3 F (36.3 C) (Oral)   Resp 16   Ht 6\' 1"  (1.854 m)   Wt 295 lb 2 oz (133.9 kg)   SpO2 97%   BMI 38.94 kg/m  General:   Well developed, well nourished . NAD.  HEENT:  Normocephalic . Face symmetric, atraumatic Skin: Left foot: Normal to inspection and palpation except for a well-healed surgical scar at midfoot. Slightly TTP at the heel, plantar aspect , see graphic. Neurologic:  alert & oriented X3.  Speech normal, gait appropriate for age and unassisted Psych--  Cognition and judgment appear intact.  Cooperative with normal attention span and concentration.  Behavior appropriate. No anxious or depressed appearing.      Assessment & Plan:    77 year old gentleman , PMH includes  atrial fibrillation, pacemaker, HTN, hypothyroidism, OSA, sentence with  Heel pain: Suspect planta fasciitis, discussed  stretching, icing, Tylenol.  Also use a shoe insert.  If not better he will let us know for referral although he is established patient with Dr. Sherlene Shams cholesterol: Decided to stop statins due to side effects  described as muscle knots.  Encouraged to discuss with PCP

## 2017-06-12 NOTE — Patient Instructions (Signed)
Stretch twice a day   Ice twice a day   Ok Tylenol 500 mg 2 tablets a day  Shoe insert    Heel Spur A heel spur is a bony growth that forms on the bottom of your heel bone (calcaneus). Heel spurs are common and do not always cause pain. However, heel spurs often cause inflammation in the strong band of tissue that runs underneath the bone of your foot (plantar fascia). When this happens, you may feel pain on the bottom of your foot, near your heel. What are the causes? The cause of heel spurs is not completely understood. They may be caused by pressure on the heel. Or, they may stem from the muscle attachments (tendons) near the spur pulling on the heel. What increases the risk? You may be at risk for a heel spur if you:  Are older than 40.  Are overweight.  Have wear and tear arthritis (osteoarthritis).  Have plantar fascia inflammation.  What are the signs or symptoms? Some people have heel spurs but no symptoms. If you do have symptoms, they may include:  Pain in the bottom of your heel.  Pain that is worse when you first get out of bed.  Pain that gets worse after walking or standing.  How is this diagnosed? Your health care provider may diagnose a heel spur based on your symptoms and a physical exam. You may also have an X-ray of your foot to check for a bony growth coming from the calcaneus. How is this treated? Treatment aims to relieve the pain from the heel spur. This may include:  Stretching exercises.  Losing weight.  Wearing specific shoes, inserts, or orthotics for comfort and support.  Wearing splints at night to properly position your feet.  Taking over-the-counter medicine to relieve pain.  Being treated with high-intensity sound waves to break up the heel spur (extracorporeal shock wave therapy).  Getting steroid injections in your heel to reduce swelling and ease pain.  Having surgery if your heel spur causes long-term (chronic) pain.  Follow  these instructions at home:  Take medicines only as directed by your health care provider.  Ask your health care provider if you should use ice or cold packs on the painful areas of your heel or foot.  Avoid activities that cause you pain until you recover or as directed by your health care provider.  Stretch before exercising or being physically active.  Wear supportive shoes that fit well as directed by your health care provider. You might need to buy new shoes. Wearing old shoes or shoes that do not fit correctly may not provide the support that you need.  Lose weight if your health care provider thinks you should. This can relieve pressure on your foot that may be causing pain and discomfort. Contact a health care provider if:  Your pain continues or gets worse. This information is not intended to replace advice given to you by your health care provider. Make sure you discuss any questions you have with your health care provider. Document Released: 02/08/2005 Document Revised: 06/10/2015 Document Reviewed: 03/05/2013 Elsevier Interactive Patient Education  [employer].

## 2017-06-28 ENCOUNTER — Encounter: Payer: Medicare HMO | Admitting: Cardiology

## 2017-07-12 DIAGNOSIS — N401 Enlarged prostate with lower urinary tract symptoms: Secondary | ICD-10-CM | POA: Diagnosis not present

## 2017-07-12 DIAGNOSIS — N3281 Overactive bladder: Secondary | ICD-10-CM | POA: Diagnosis not present

## 2017-07-12 DIAGNOSIS — R351 Nocturia: Secondary | ICD-10-CM | POA: Diagnosis not present

## 2017-07-12 DIAGNOSIS — N5201 Erectile dysfunction due to arterial insufficiency: Secondary | ICD-10-CM | POA: Diagnosis not present

## 2017-07-16 ENCOUNTER — Telehealth: Payer: Self-pay | Admitting: Family Medicine

## 2017-07-16 NOTE — Telephone Encounter (Signed)
iCopied from [place]. Topic: General - Other >> Jul 16, 2017  3:51 PM Marin Olp L wrote: Reason for CRM: Patient calling to let Dr. Nani Ravens know he does not need a refill on flonase. Any correspondence faxed regarding this can be disregarded.   Will note

## 2017-07-18 NOTE — Progress Notes (Signed)
Subjective:   Arthur Daniel is a 77 y.o. male who presents for Medicare Annual/Subsequent preventive examination.  Review of Systems: No ROS.  Medicare Wellness Visit. Additional risk factors are reflected in the social history. Cardiac Risk Factors include: advanced age (>55men, >24 women);dyslipidemia;hypertension;male gender;obesity (BMI >30kg/m2) Sleep patterns: Wears CPAP. Sleeps 6-7 hrs.  Home Safety/Smoke Alarms: Feels safe in home. Smoke alarms in place.  Living environment; residence and Firearm Safety: Lives with wife. No issues with stairs.   Male:   CCS- last reported 01/17/12    PSA-  Lab Results  Component Value Date   PSA 0.25 10/18/2015       Objective:    Vitals: BP (!) 148/79 (BP Location: Left Wrist, Patient Position: Sitting, Cuff Size: Normal)   Pulse 68   Ht 6\' 1"  (1.854 m)   Wt (!) 300 lb 3.2 oz (136.2 kg)   SpO2 95%   BMI 39.61 kg/m   Body mass index is 39.61 kg/m.  Advanced Directives 07/26/2017 08/24/2016 07/24/2016 06/25/2015 10/16/2013  Does Patient Have a Medical Advance Directive? Yes Yes Yes No Yes  Type of Paramedic of [place];Living will Living will [place];Living will - Living will  Does patient want to make changes to medical advance directive? - No - Patient declined - - -  Copy of [place] in Chart? No - copy requested - No - copy requested - No - copy requested  Would patient like information on creating a medical advance directive? - - - No - patient declined information -    Tobacco Social History   Tobacco Use  Smoking Status Former Smoker  . Packs/day: 1.00  . Years: 55.00  . Pack years: 55.00  . Types: Cigarettes  . Last attempt to quit: 01/16/2006  . Years since quitting: 11.5  Smokeless Tobacco Former Systems developer  . Types: Chew  . Quit date: 09/16/2013  Tobacco Comment   1/2-1 pack a day     Counseling given: Not Answered Comment: 1/2-1 pack a day   Clinical  Intake: Pain : No/denies pain    Past Medical History:  Diagnosis Date  . 10 year risk of MI or stroke 7.5% or greater 04/28/2016  . Allergic rhinitis, cause unspecified   . Atrial fibrillation ([place])     Associated with hyperthyroidism/Graves' disease  . BPH (benign prostatic hyperplasia) 10/18/2015  . Calculus of kidney   . Diverticulosis   . ED (erectile dysfunction)   . Essential hypertension, benign   . Hematuria, unspecified   . Hernia, umbilical   . History of chicken pox   . Lump or mass in breast   . Mixed hyperlipidemia   . Obesity   . Pacemaker    [place], [place] 2010  . Postablative hypothyroidism   . Sleep apnea    Use CPAP machine  . Ureteral stone    Dr. Risa Grill   Past Surgical History:  Procedure Laterality Date  . ANTERIOR CRUCIATE LIGAMENT REPAIR Right 1994  . CATARACT EXTRACTION, BILATERAL  08/2010  . COLONOSCOPY  2001, 2008  . FLEXIBLE SIGMOIDOSCOPY  2001  . FOOT SURGERY Left 2005   Fot repair after chainsaw accident  . INSERTION OF MESH N/A 08/31/2016   Procedure: INSERTION OF MESH;  Surgeon: Jackolyn Confer, MD;  Location: [facility];  Service: General;  Laterality: N/A;  . MENISCUS REPAIR Left 2004  . OTHER SURGICAL HISTORY  1995   Ear surgery due to mastoiditis  .  PACEMAKER INSERTION  11/26/08   [name]  . SHOULDER ARTHROSCOPY WITH SUBACROMIAL DECOMPRESSION Left 10/20/2013   Procedure: LEFT SHOULDER SCOPE/DISTAL ACROMIOPLASTY, LABRAL DEBRIDMENT;  Surgeon: [name], MD;  Location: [facility];  Service: Orthopedics;  Laterality: Left;  . SHOULDER SURGERY Right 11/2002  . SHOULDER SURGERY Right 2011  . THYROGLOSSAL DUCT CYST    . TONSILLECTOMY AND ADENOIDECTOMY    . UMBILICAL HERNIA REPAIR N/A 08/31/2016   Procedure: UMBILICAL HERNIA REPAIR WITH MESH;  Surgeon: Jackolyn Confer, MD;  Location: [facility];  Service: General;  Laterality: N/A;  . VASECTOMY     Family History  Problem Relation Age of Onset  . CVA Mother   .  Hypercholesterolemia Mother   . Goiter Mother   . Diabetes Mother        DM  . Hypertension Mother   . CAD Mother   . Cirrhosis Father        due to alcohol  . Cancer Sister        Breast  . Hypercholesterolemia Brother   . Hypertension Brother   . CAD Brother   . Heart attack Son 75   Social History   Socioeconomic History  . Marital status: Married    Spouse name: Not on file  . Number of children: Not on file  . Years of education: Not on file  . Highest education level: Not on file  Occupational History  . Not on file  Social Needs  . Financial resource strain: Not on file  . Food insecurity:    Worry: Not on file    Inability: Not on file  . Transportation needs:    Medical: Not on file    Non-medical: Not on file  Tobacco Use  . Smoking status: Former Smoker    Packs/day: 1.00    Years: 55.00    Pack years: 55.00    Types: Cigarettes    Last attempt to quit: 01/16/2006    Years since quitting: 11.5  . Smokeless tobacco: Former Systems developer    Types: Chew    Quit date: 09/16/2013  . Tobacco comment: 1/2-1 pack a day  Substance and Sexual Activity  . Alcohol use: Yes    Comment: rarely drinks wine  . Drug use: No  . Sexual activity: Yes  Lifestyle  . Physical activity:    Days per week: Not on file    Minutes per session: Not on file  . Stress: Not on file  Relationships  . Social connections:    Talks on phone: Not on file    Gets together: Not on file    Attends religious service: Not on file    Active member of club or organization: Not on file    Attends meetings of clubs or organizations: Not on file    Relationship status: Not on file  Other Topics Concern  . Not on file  Social History Narrative  . Not on file    Outpatient Encounter Medications as of 07/26/2017  Medication Sig  . alfuzosin (UROXATRAL) 10 MG 24 hr tablet Take 10 mg by mouth at bedtime.  . Flaxseed, Linseed, (FLAX SEED OIL) 1300 MG CAPS Take 1,300 mg by mouth daily.  Marland Kitchen  levothyroxine (SYNTHROID, LEVOTHROID) 175 MCG tablet Take 1 tablet (175 mcg total) by mouth daily before breakfast.  . MYRBETRIQ 50 MG TB24 tablet Take 50 mg by mouth daily.   . NON FORMULARY C- PAP MACHINE  . Omega-3 Fatty Acids (FISH OIL)  1000 MG CPDR Take 1,000 mg by mouth daily.  . OMEGA-3 KRILL OIL PO Take by mouth.  Marland Kitchen OVER THE COUNTER MEDICATION Take 1 capsule by mouth daily. Amazing Grape Supplement  . Tadalafil (CIALIS PO) Take by mouth as needed.  . TURMERIC PO Take by mouth.  . [DISCONTINUED] alfuzosin (UROXATRAL) 10 MG 24 hr tablet Take 10 mg by mouth at bedtime.    No facility-administered encounter medications on file as of 07/26/2017.     Activities of Daily Living In your present state of health, do you have any difficulty performing the following activities: 07/26/2017 08/24/2016  Hearing? Y Y  Comment Has hearing aids. Not wearing them.  Bilateral Hearing Aids  Vision? N N  Difficulty concentrating or making decisions? N N  Walking or climbing stairs? N Y  Dressing or bathing? N N  Doing errands, shopping? N N  Preparing Food and eating ? N -  Using the Toilet? N -  In the past six months, have you accidently leaked urine? N -  Do you have problems with loss of bowel control? N -  Managing your Medications? N -  Managing your Finances? N -  Housekeeping or managing your Housekeeping? N -  Some recent data might be hidden    Patient Care Team: Shelda Pal, DO as PCP - General (Family Medicine) Hollar, Katharine Look, MD as Referring Physician (Dermatology) McKenzie, Candee Furbish, MD as Consulting Physician (Urology) Frederik Pear, MD as Consulting Physician (Orthopedic Surgery) Charlyn Minerva, [place] (Internal Medicine) Deboraha Sprang, MD as Consulting Physician (Cardiology)   Assessment:   This is a routine wellness examination for [place]. Physical assessment deferred to PCP.  Exercise Activities and Dietary recommendations Current Exercise Habits:  Home exercise routine, Type of exercise: treadmill, Time (Minutes): 15, Frequency (Times/Week): 3, Weekly Exercise (Minutes/Week): 45, Intensity: Mild, Exercise limited by: None identified   Diet (meal preparation, eat out, water intake, caffeinated beverages, dairy products, fruits and vegetables): 148/79 Breakfast: Berniece Salines, country ham, egg, toast and butter Lunch: vegetables Dinner:  Fruit or sweet Drinks at least a gallon of water per day.  Goals    . maintain current active lifestyle.       Fall Risk Fall Risk  07/26/2017 07/24/2016 09/17/2015  Falls in the past year? No Yes No  Number falls in past yr: - 1 -  Injury with Fall? - No -  Follow up - Education provided;Falls prevention discussed -    Depression Screen PHQ 2/9 Scores 07/26/2017 07/24/2016 09/17/2015  PHQ - 2 Score 0 0 3  PHQ- 9 Score - - 6    Cognitive Function Ad8 score reviewed for issues:  Issues making decisions:no  Less interest in hobbies / activities:no  Repeats questions, stories (family complaining):no  Trouble using ordinary gadgets (microwave, computer, phone):no  Forgets the month or year: no  Remembering appts:no  Daily problems with thinking and/or memory:no Ad8 score is=0        Immunization History  Administered Date(s) Administered  . Influenza, High Dose Seasonal PF 11/01/2016  . Influenza,inj,Quad PF,6+ Mos 10/18/2015     Screening Tests Health Maintenance  Topic Date Due  . TETANUS/TDAP  12/23/1961  . PNA vac Low Risk Adult (1 of 2 - PCV13) 07/27/2018 (Originally 12/24/2007)  . INFLUENZA VACCINE  08/16/2017  . COLONOSCOPY  01/16/2022      Plan:    Please schedule your next medicare wellness visit with me in 1 yr.  Eat heart healthy diet (full  of fruits, vegetables, whole grains, lean protein, water--limit salt, fat, and sugar intake) and increase physical activity as tolerated.  Do brain stimulating activities (puzzles, reading, adult coloring books, staying active) to keep  memory sharp.   Bring a copy of your living will and/or healthcare power of attorney to your next office visit.   I have personally reviewed and noted the following in the patient's chart:   . Medical and social history . Use of alcohol, tobacco or illicit drugs  . Current medications and supplements . Functional ability and status . Nutritional status . Physical activity . Advanced directives . List of other physicians . Hospitalizations, surgeries, and ER visits in previous 12 months . Vitals . Screenings to include cognitive, depression, and falls . Referrals and appointments  In addition, I have reviewed and discussed with patient certain preventive protocols, quality metrics, and best practice recommendations. A written personalized care plan for preventive services as well as general preventive health recommendations were provided to patient.     Shela Nevin, [place]  07/26/2017

## 2017-07-25 ENCOUNTER — Ambulatory Visit: Payer: BLUE CROSS/BLUE SHIELD | Admitting: *Deleted

## 2017-07-26 ENCOUNTER — Ambulatory Visit (INDEPENDENT_AMBULATORY_CARE_PROVIDER_SITE_OTHER): Payer: Medicare HMO | Admitting: *Deleted

## 2017-07-26 ENCOUNTER — Encounter: Payer: Self-pay | Admitting: *Deleted

## 2017-07-26 VITALS — BP 148/79 | HR 68 | Ht 73.0 in | Wt 300.2 lb

## 2017-07-26 DIAGNOSIS — Z0000 Encounter for general adult medical examination without abnormal findings: Secondary | ICD-10-CM

## 2017-07-26 NOTE — Progress Notes (Signed)
Noted. Agree with above.  [place], DO 07/26/17 4:53 PM

## 2017-07-26 NOTE — Patient Instructions (Signed)
Please schedule your next medicare wellness visit with me in 1 yr.  Eat heart healthy diet (full of fruits, vegetables, whole grains, lean protein, water--limit salt, fat, and sugar intake) and increase physical activity as tolerated.  Do brain stimulating activities (puzzles, reading, adult coloring books, staying active) to keep memory sharp.   Bring a copy of your living will and/or healthcare power of attorney to your next office visit.   Arthur Daniel , Thank you for taking time to come for your Medicare Wellness Visit. I appreciate your ongoing commitment to your health goals. Please review the following plan we discussed and let me know if I can assist you in the future.   These are the goals we discussed: Goals    . maintain current active lifestyle.       This is a list of the screening recommended for you and due dates:  Health Maintenance  Topic Date Due  . Tetanus Vaccine  12/23/1961  . Pneumonia vaccines (1 of 2 - PCV13) 07/27/2018*  . Flu Shot  08/16/2017  . Colon Cancer Screening  01/16/2022  *Topic was postponed. The date shown is not the original due date.    Health Maintenance, Male A healthy lifestyle and preventive care is important for your health and wellness. Ask your health care provider about what schedule of regular examinations is right for you. What should I know about weight and diet? Eat a Healthy Diet  Eat plenty of vegetables, fruits, whole grains, low-fat dairy products, and lean protein.  Do not eat a lot of foods high in solid fats, added sugars, or salt.  Maintain a Healthy Weight Regular exercise can help you achieve or maintain a healthy weight. You should:  Do at least 150 minutes of exercise each week. The exercise should increase your heart rate and make you sweat (moderate-intensity exercise).  Do strength-training exercises at least twice a week.  Watch Your Levels of Cholesterol and Blood Lipids  Have your blood tested for lipids  and cholesterol every 5 years starting at 75 years of age. If you are at high risk for heart disease, you should start having your blood tested when you are 75 years old. You may need to have your cholesterol levels checked more often if: ? Your lipid or cholesterol levels are high. ? You are older than 75 years of age. ? You are at high risk for heart disease.  What should I know about cancer screening? Many types of cancers can be detected early and may often be prevented. Lung Cancer  You should be screened every year for lung cancer if: ? You are a current smoker who has smoked for at least 30 years. ? You are a former smoker who has quit within the past 15 years.  Talk to your health care provider about your screening options, when you should start screening, and how often you should be screened.  Colorectal Cancer  Routine colorectal cancer screening usually begins at 75 years of age and should be repeated every 5-10 years until you are 75 years old. You may need to be screened more often if early forms of precancerous polyps or small growths are found. Your health care provider may recommend screening at an earlier age if you have risk factors for colon cancer.  Your health care provider may recommend using home test kits to check for hidden blood in the stool.  A small camera at the end of a tube can be used to  examine your colon (sigmoidoscopy or colonoscopy). This checks for the earliest forms of colorectal cancer.  Prostate and Testicular Cancer  Depending on your age and overall health, your health care provider may do certain tests to screen for prostate and testicular cancer.  Talk to your health care provider about any symptoms or concerns you have about testicular or prostate cancer.  Skin Cancer  Check your skin from head to toe regularly.  Tell your health care provider about any new moles or changes in moles, especially if: ? There is a change in a mole's size,  shape, or color. ? You have a mole that is larger than a pencil eraser.  Always use sunscreen. Apply sunscreen liberally and repeat throughout the day.  Protect yourself by wearing long sleeves, pants, a wide-brimmed hat, and sunglasses when outside.  What should I know about heart disease, diabetes, and high blood pressure?  If you are 32-75 years of age, have your blood pressure checked every 3-5 years. If you are 42 years of age or older, have your blood pressure checked every year. You should have your blood pressure measured twice-once when you are at a hospital or clinic, and once when you are not at a hospital or clinic. Record the average of the two measurements. To check your blood pressure when you are not at a hospital or clinic, you can use: ? An automated blood pressure machine at a pharmacy. ? A home blood pressure monitor.  Talk to your health care provider about your target blood pressure.  If you are between 12-35 years old, ask your health care provider if you should take aspirin to prevent heart disease.  Have regular diabetes screenings by checking your fasting blood sugar level. ? If you are at a normal weight and have a low risk for diabetes, have this test once every three years after the age of 41. ? If you are overweight and have a high risk for diabetes, consider being tested at a younger age or more often.  A one-time screening for abdominal aortic aneurysm (AAA) by ultrasound is recommended for men aged 45-75 years who are current or former smokers. What should I know about preventing infection? Hepatitis B If you have a higher risk for hepatitis B, you should be screened for this virus. Talk with your health care provider to find out if you are at risk for hepatitis B infection. Hepatitis C Blood testing is recommended for:  Everyone born from [birth year] through 1963.  Anyone with known risk factors for hepatitis C.  Sexually Transmitted Diseases (STDs)  You  should be screened each year for STDs including gonorrhea and chlamydia if: ? You are sexually active and are younger than 75 years of age. ? You are older than 75 years of age and your health care provider tells you that you are at risk for this type of infection. ? Your sexual activity has changed since you were last screened and you are at an increased risk for chlamydia or gonorrhea. Ask your health care provider if you are at risk.  Talk with your health care provider about whether you are at high risk of being infected with HIV. Your health care provider may recommend a prescription medicine to help prevent HIV infection.  What else can I do?  Schedule regular health, dental, and eye exams.  Stay current with your vaccines (immunizations).  Do not use any tobacco products, such as cigarettes, chewing tobacco, and e-cigarettes. If you need  help quitting, ask your health care provider.  Limit alcohol intake to no more than 2 drinks per day. One drink equals 12 ounces of beer, 5 ounces of wine, or 1 ounces of hard liquor.  Do not use street drugs.  Do not share needles.  Ask your health care provider for help if you need support or information about quitting drugs.  Tell your health care provider if you often feel depressed.  Tell your health care provider if you have ever been abused or do not feel safe at home. This information is not intended to replace advice given to you by your health care provider. Make sure you discuss any questions you have with your health care provider. Document Released: 07/01/2007 Document Revised: 09/01/2015 Document Reviewed: 10/06/2014 Elsevier Interactive Patient Education  [employer].

## 2017-07-31 DIAGNOSIS — H95191 Other disorders following mastoidectomy, right ear: Secondary | ICD-10-CM | POA: Diagnosis not present

## 2017-08-07 DIAGNOSIS — Z9089 Acquired absence of other organs: Secondary | ICD-10-CM | POA: Diagnosis not present

## 2017-08-07 DIAGNOSIS — H95191 Other disorders following mastoidectomy, right ear: Secondary | ICD-10-CM | POA: Diagnosis not present

## 2017-08-08 ENCOUNTER — Ambulatory Visit (HOSPITAL_BASED_OUTPATIENT_CLINIC_OR_DEPARTMENT_OTHER)
Admission: RE | Admit: 2017-08-08 | Discharge: 2017-08-08 | Disposition: A | Discharge status: Home / Self Care | Payer: Medicare HMO | Source: Ambulatory Visit | Attending: Family Medicine | Admitting: Family Medicine

## 2017-08-08 ENCOUNTER — Encounter: Payer: Self-pay | Admitting: Family Medicine

## 2017-08-08 ENCOUNTER — Ambulatory Visit (INDEPENDENT_AMBULATORY_CARE_PROVIDER_SITE_OTHER): Payer: Medicare HMO | Admitting: Family Medicine

## 2017-08-08 VITALS — BP 120/80 | HR 70 | Temp 98.4°F | Ht 73.0 in | Wt 300.0 lb

## 2017-08-08 DIAGNOSIS — M79672 Pain in left foot: Secondary | ICD-10-CM

## 2017-08-08 DIAGNOSIS — T300 Burn of unspecified body region, unspecified degree: Secondary | ICD-10-CM

## 2017-08-08 DIAGNOSIS — Y93G2 Activity, grilling and smoking food: Secondary | ICD-10-CM

## 2017-08-08 DIAGNOSIS — D489 Neoplasm of uncertain behavior, unspecified: Secondary | ICD-10-CM | POA: Diagnosis not present

## 2017-08-08 DIAGNOSIS — G8929 Other chronic pain: Secondary | ICD-10-CM | POA: Diagnosis not present

## 2017-08-08 MED ORDER — SILVER SULFADIAZINE 1 % EX CREA: 1.0000 "application " | TOPICAL_CREAM | Freq: Every day | CUTANEOUS | 0 refills | Status: AC

## 2017-08-08 MED ORDER — ALPRAZOLAM 0.5 MG PO TABS: 0.5000 mg | ORAL_TABLET | Freq: Every day | ORAL | 0 refills | Status: DC | PRN

## 2017-08-08 NOTE — Patient Instructions (Addendum)
Continue with ice.  PowerStep inserts may be more beneficial.  OK to continue   Plantar Fasciitis Stretches/exercises Do exercises exactly as told by your health care provider and adjust them as directed. It is normal to feel mild stretching, pulling, tightness, or discomfort as you do these exercises, but you should stop right away if you feel sudden pain or your pain gets worse.   Stretching and range of motion exercises These exercises warm up your muscles and joints and improve the movement and flexibility of your foot. These exercises also help to relieve pain.  Exercise A: Plantar fascia stretch 1. Sit with your left / right leg crossed over your opposite knee. 2. Hold your heel with one hand with that thumb near your arch. With your other hand, hold your toes and gently pull them back toward the top of your foot. You should feel a stretch on the bottom of your toes or your foot or both. 3. Hold this stretch for 30 seconds. 4. Slowly release your toes and return to the starting position. Repeat 2 times. Complete this exercise 3 times per week.  Exercise B: Gastroc, standing 1. Stand with your hands against a wall. 2. Extend your left / right leg behind you, and bend your front knee slightly. 3. Keeping your heels on the floor and keeping your back knee straight, shift your weight toward the wall without arching your back. You should feel a gentle stretch in your left / right calf. 4. Hold this position for 30 seconds. Repeat 2 times. Complete this exercise 3 times a week. Exercise C: Soleus, standing 1. Stand with your hands against a wall. 2. Extend your left / right leg behind you, and bend your front knee slightly. 3. Keeping your heels on the floor, bend your back knee and slightly shift your weight over the back leg. You should feel a gentle stretch deep in your calf. 4. Hold this position for 30 seconds. Repeat 2 times. Complete this exercise 3 times per week. Exercise D:  Gastrocsoleus, standing 1. Stand with the ball of your left / right foot on a step. The ball of your foot is on the walking surface, right under your toes. 2. Keep your other foot firmly on the same step. 3. Hold onto the wall or a railing for balance. 4. Slowly lift your other foot, allowing your body weight to press your heel down over the edge of the step. You should feel a stretch in your left / right calf. 5. Hold this position for 30 seconds. 6. Return both feet to the step. 7. Repeat this exercise with a slight bend in your left / right knee. Repeat 2 times with your left / right knee straight and 2times with your left / right knee bent. Complete this exercise 3 times a week.  Balance exercise This exercise builds your balance and strength control of your arch to help take pressure off your plantar fascia. Exercise E: Single leg stand 1. Without shoes, stand near a railing or in a doorway. You may hold onto the railing or door frame as needed. 2. Stand on your left / right foot. Keep your big toe down on the floor and try to keep your arch lifted. Do not let your foot roll inward. 3. Hold this position for 30 seconds. 4. If this exercise is too easy, you can try it with your eyes closed or while standing on a pillow. Repeat 2 times. Complete this exercise 3 times  per week. This information is not intended to replace advice given to you by your health care provider. Make sure you discuss any questions you have with your health care provider. Document Released: 01/02/2005 Document Revised: 09/07/2015 Document Reviewed: 11/16/2014 Elsevier Interactive Patient Education  2017 [employer].

## 2017-08-08 NOTE — Progress Notes (Signed)
Pre visit review using our clinic review tool, if applicable. No additional management support is needed unless otherwise documented below in the visit note. 
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
## 2017-08-10 ENCOUNTER — Telehealth: Payer: Self-pay | Admitting: Family Medicine

## 2017-08-10 NOTE — Telephone Encounter (Signed)
Copied from [place] [id number]. Topic: Quick Communication - Lab Results >> Aug 10, 2017  8:49 AM Ewing, Donell Sievert, CMA wrote: Called patient to inform them of 08/10/2017 lab results. When patient returns call, triage nurse may disclose results. >> Aug 10, 2017  8:54 AM Bea Graff, NT wrote: Pt calling back to get xray results.

## 2017-08-10 NOTE — Telephone Encounter (Signed)
See result note for documentation

## 2017-08-14 DIAGNOSIS — G4733 Obstructive sleep apnea (adult) (pediatric): Secondary | ICD-10-CM | POA: Diagnosis not present

## 2017-08-17 ENCOUNTER — Encounter: Payer: Self-pay | Admitting: Family Medicine

## 2017-08-17 ENCOUNTER — Ambulatory Visit (INDEPENDENT_AMBULATORY_CARE_PROVIDER_SITE_OTHER): Payer: Medicare HMO | Admitting: Family Medicine

## 2017-08-17 DIAGNOSIS — L820 Inflamed seborrheic keratosis: Secondary | ICD-10-CM | POA: Diagnosis not present

## 2017-08-17 DIAGNOSIS — M722 Plantar fascial fibromatosis: Secondary | ICD-10-CM | POA: Insufficient documentation

## 2017-08-17 DIAGNOSIS — D489 Neoplasm of uncertain behavior, unspecified: Secondary | ICD-10-CM

## 2017-08-17 MED ORDER — METHYLPREDNISOLONE ACETATE 40 MG/ML IJ SUSP
20.0000 mg | Freq: Once | INTRAMUSCULAR | Status: AC
  Administered 2017-08-17: 20 mg via INTRA_ARTICULAR

## 2017-08-17 NOTE — Patient Instructions (Signed)
Do not shower for the rest of the day. When you do wash it, use only soap and water. Do not vigorously scrub. Apply triple antibiotic ointment (like Neosporin) twice daily. Keep the area clean and dry.   Things to look out for: increasing pain not relieved by ibuprofen/acetaminophen, fevers, spreading redness, drainage of pus, or foul odor.  1 business week to get results of biopsy back.   Let us know if you need anything.

## 2017-08-17 NOTE — Progress Notes (Signed)
Chief Complaint  Patient presents with  . Procedure   Pt here for shave biopsy.  He also continues to have left heel pain.  His x-ray did show spurs in his heel, he has been compliant with ice, Tylenol, and stretches/exercises.  He does have inserts in his left shoe.  He still having pain and is no longer able to walk.  He is wondering if there is anything we can do form today.  ROS: MSK: +foot pain  Past Medical History:  Diagnosis Date  . 10 year risk of MI or stroke 7.5% or greater 04/28/2016  . Allergic rhinitis, cause unspecified   . Atrial fibrillation ([place])     Associated with hyperthyroidism/Graves' disease  . BPH (benign prostatic hyperplasia) 10/18/2015  . Calculus of kidney   . Diverticulosis   . ED (erectile dysfunction)   . Essential hypertension, benign   . Hematuria, unspecified   . Hernia, umbilical   . History of chicken pox   . Lump or mass in breast   . Mixed hyperlipidemia   . Obesity   . Pacemaker    [place], [place] 2010  . Postablative hypothyroidism   . Sleep apnea    Use CPAP machine  . Ureteral stone    Dr. Risa Grill    Exam Gen- awake, alert MSK- +ttp over prox med insertion of L plantar fascia Neuro- sensation intact to light touch Lungs- no access musc use Psych- age appropriate judgment and insight Skin- See below.   R forearm  Procedure note; shave biopsy Informed consent was obtained. The area was cleaned with alcohol and injected with 1 mL of 1% lidocaine with epinephrine. A Dermablade was slightly bent and used to cut under the area of interest. The specimen was placed in a sterile specimen cup and sent to the lab. The area was then cauterized ensuring adequate hemostasis. The area was dressed with triple antibiotic ointment and a bandage. There were no complications noted. The patient tolerated the procedure well.  Procedure note; plantar fascia injection, L Verbal consent obtained The area of interest was palpated and marked with  an otoscope speculum It was then cleaned with alcohol x1 Free spray was then used for topical anesthesia 20 mg of Depo-Medrol with 1 mL of 2% lidocaine without epinephrine was injected with a 30-gauge needle The area was then bandaged The patient tolerated the procedure well There were no immediate complications noted   Neoplasm of uncertain behavior - Plan: Dermatology pathology, PR SHAV SKIN LES < 0.5 CM TRUNK,ARM,LEG  Plantar fasciitis - Plan: methylPREDNISolone acetate (DEPO-MEDROL) injection 20 mg, PR INJECT TENDON SHEATH/LIGAMENT  Orders as above. Aftercare instructions verbalized and written down. 1 week for bx results. Patient reported immediate improvement after injection.  He was warned that this could be worse over the next week before the steroid kicks in.  Continue ice, Tylenol, stretches/exercises, and inserts. F/u prn. Pt voiced understanding and agreement to the plan.  Arthur Daniel 4:41 PM 08/17/17

## 2017-08-23 ENCOUNTER — Telehealth: Payer: Self-pay | Admitting: *Deleted

## 2017-08-23 NOTE — Telephone Encounter (Signed)
Received Dermatopathology Report results from [facility]; forwarded to provider/SLS 08/08

## 2017-09-04 ENCOUNTER — Telehealth: Payer: Self-pay | Admitting: Family Medicine

## 2017-09-04 DIAGNOSIS — E890 Postprocedural hypothyroidism: Secondary | ICD-10-CM

## 2017-09-04 MED ORDER — LEVOTHYROXINE SODIUM 175 MCG PO TABS: 175.0000 ug | ORAL_TABLET | Freq: Every day | ORAL | 1 refills | Status: DC

## 2017-09-04 NOTE — Telephone Encounter (Signed)
Copied from [place] [id number]. Topic: Quick Communication - Rx Refill/Question >> Sep 04, 2017  8:55 AM [name] wrote: Medication levothyroxine (SYNTHROID, LEVOTHROID) 175 MCG tablet  Has the patient contacted their pharmacy yes     Preferred Pharmacy  [facility] [place] Agent: Please be advised that RX refills may take up to 3 business days. We ask that you follow-up with your pharmacy.

## 2017-09-05 ENCOUNTER — Other Ambulatory Visit: Payer: Self-pay | Admitting: Family Medicine

## 2017-09-05 DIAGNOSIS — E890 Postprocedural hypothyroidism: Secondary | ICD-10-CM

## 2017-09-05 MED ORDER — LEVOTHYROXINE SODIUM 175 MCG PO TABS: 175.0000 ug | ORAL_TABLET | Freq: Every day | ORAL | 1 refills | Status: DC

## 2017-09-11 DIAGNOSIS — G4733 Obstructive sleep apnea (adult) (pediatric): Secondary | ICD-10-CM | POA: Diagnosis not present

## 2017-09-27 ENCOUNTER — Ambulatory Visit (INDEPENDENT_AMBULATORY_CARE_PROVIDER_SITE_OTHER): Payer: Medicare HMO | Admitting: *Deleted

## 2017-09-27 DIAGNOSIS — I495 Sick sinus syndrome: Secondary | ICD-10-CM

## 2017-09-27 NOTE — Progress Notes (Signed)
Remote pacemaker transmission.   

## 2017-10-17 LAB — CUP PACEART REMOTE DEVICE CHECK
Battery Remaining Longevity: 27 mo
Battery Remaining Percentage: 22 %
Brady Statistic AP VS Percent: 24 %
Brady Statistic AS VS Percent: 75 %
Date Time Interrogation Session: 20190912065840
Implantable Lead Implant Date: 20101111
Implantable Lead Location: 753859
Implantable Pulse Generator Implant Date: 20101111
Lead Channel Impedance Value: 340 Ohm
Lead Channel Pacing Threshold Amplitude: 1.25 V
Lead Channel Pacing Threshold Pulse Width: 0.4 ms
Lead Channel Sensing Intrinsic Amplitude: 4.1 mV
Lead Channel Setting Pacing Amplitude: 2 V
Lead Channel Setting Sensing Sensitivity: 2 mV
MDC IDC LEAD IMPLANT DT: 20101111
MDC IDC LEAD LOCATION: 753860
MDC IDC MSMT BATTERY VOLTAGE: 2.78 V
MDC IDC MSMT LEADCHNL RA PACING THRESHOLD AMPLITUDE: 1 V
MDC IDC MSMT LEADCHNL RA PACING THRESHOLD PULSEWIDTH: 0.4 ms
MDC IDC MSMT LEADCHNL RV IMPEDANCE VALUE: 380 Ohm
MDC IDC MSMT LEADCHNL RV SENSING INTR AMPL: 9.7 mV
MDC IDC PG SERIAL: 7079515
MDC IDC SET LEADCHNL RV PACING AMPLITUDE: 2.5 V
MDC IDC SET LEADCHNL RV PACING PULSEWIDTH: 0.4 ms
MDC IDC STAT BRADY AP VP PERCENT: 1 %
MDC IDC STAT BRADY AS VP PERCENT: 1 %
MDC IDC STAT BRADY RA PERCENT PACED: 24 %
MDC IDC STAT BRADY RV PERCENT PACED: 1 %

## 2017-10-30 ENCOUNTER — Ambulatory Visit (INDEPENDENT_AMBULATORY_CARE_PROVIDER_SITE_OTHER): Payer: Medicare HMO

## 2017-10-30 ENCOUNTER — Telehealth: Payer: Self-pay | Admitting: Family Medicine

## 2017-10-30 DIAGNOSIS — Z23 Encounter for immunization: Secondary | ICD-10-CM

## 2017-10-30 NOTE — Telephone Encounter (Signed)
Copied from [place] [id number]. Topic: Quick Communication - See Telephone Encounter >> Oct 30, 2017 10:19 AM Rosalin Hawking wrote: CRM for notification. See Telephone encounter for: 10/30/17.   Pt came in office stating had a pneumonia shot on 04-06-2010 at [facility] and is wanting to know if he is due for another one, if so please inform pt, pt stated he can have it done on his appt with Provider in November. Please advise.

## 2017-10-31 NOTE — Telephone Encounter (Signed)
OK to update chart for Prevnar (13) on that date and we can give him the 23 when he comes in. TY.

## 2017-10-31 NOTE — Telephone Encounter (Signed)
Updated chart.

## 2017-11-09 ENCOUNTER — Telehealth: Payer: Self-pay | Admitting: *Deleted

## 2017-11-09 NOTE — Telephone Encounter (Signed)
Received Medical records from [place] at [facility]; forwarded to provider/SLS 10/25

## 2017-11-13 DIAGNOSIS — G4733 Obstructive sleep apnea (adult) (pediatric): Secondary | ICD-10-CM | POA: Diagnosis not present

## 2017-11-21 ENCOUNTER — Encounter: Payer: Self-pay | Admitting: Family Medicine

## 2017-11-21 ENCOUNTER — Other Ambulatory Visit: Payer: Self-pay | Admitting: Family Medicine

## 2017-11-21 ENCOUNTER — Ambulatory Visit (INDEPENDENT_AMBULATORY_CARE_PROVIDER_SITE_OTHER): Payer: Medicare HMO | Admitting: Family Medicine

## 2017-11-21 VITALS — BP 130/78 | HR 73 | Temp 98.3°F | Ht 73.0 in | Wt 303.4 lb

## 2017-11-21 DIAGNOSIS — Z0000 Encounter for general adult medical examination without abnormal findings: Secondary | ICD-10-CM

## 2017-11-21 DIAGNOSIS — Z23 Encounter for immunization: Secondary | ICD-10-CM

## 2017-11-21 DIAGNOSIS — Z125 Encounter for screening for malignant neoplasm of prostate: Secondary | ICD-10-CM | POA: Diagnosis not present

## 2017-11-21 DIAGNOSIS — E890 Postprocedural hypothyroidism: Secondary | ICD-10-CM | POA: Diagnosis not present

## 2017-11-21 LAB — COMPREHENSIVE METABOLIC PANEL
ALBUMIN: 4.2 g/dL (ref 3.5–5.2)
ALK PHOS: 27 U/L — AB (ref 39–117)
ALT: 20 U/L (ref 0–53)
AST: 16 U/L (ref 0–37)
BILIRUBIN TOTAL: 0.5 mg/dL (ref 0.2–1.2)
BUN: 15 mg/dL (ref 6–23)
CO2: 25 mEq/L (ref 19–32)
CREATININE: 0.94 mg/dL (ref 0.40–1.50)
Calcium: 9 mg/dL (ref 8.4–10.5)
Chloride: 103 mEq/L (ref 96–112)
GFR: 83.17 mL/min (ref >60.00)
Glucose, Bld: 123 mg/dL — ABNORMAL HIGH (ref 70–99)
POTASSIUM: 4.1 meq/L (ref 3.5–5.1)
SODIUM: 138 meq/L (ref 135–145)
TOTAL PROTEIN: 6.6 g/dL (ref 6.0–8.3)

## 2017-11-21 LAB — LIPID PANEL
Cholesterol: 283 mg/dL — ABNORMAL HIGH (ref 0–200)
HDL: 42.8 mg/dL (ref >39.00)
NonHDL: 240.49
Total CHOL/HDL Ratio: 7
Triglycerides: 261 mg/dL — ABNORMAL HIGH (ref 0.0–149.0)
VLDL: 52.2 mg/dL — ABNORMAL HIGH (ref 0.0–40.0)

## 2017-11-21 LAB — LDL CHOLESTEROL, DIRECT: Direct LDL: 218 mg/dL

## 2017-11-21 LAB — PSA: PSA: 0.16 ng/mL (ref 0.10–4.00)

## 2017-11-21 MED ORDER — LEVOTHYROXINE SODIUM 175 MCG PO TABS: 175.0000 ug | ORAL_TABLET | Freq: Every day | ORAL | 1 refills | Status: DC

## 2017-11-21 MED ORDER — PITAVASTATIN CALCIUM 1 MG PO TABS: 1.0000 mg | ORAL_TABLET | Freq: Every day | ORAL | 1 refills | Status: DC

## 2017-11-21 MED ORDER — ALPRAZOLAM 0.5 MG PO TABS: 0.5000 mg | ORAL_TABLET | Freq: Every day | ORAL | 0 refills | Status: DC | PRN

## 2017-11-21 MED ORDER — EZETIMIBE 10 MG PO TABS: 10.0000 mg | ORAL_TABLET | Freq: Every day | ORAL | 5 refills | Status: DC

## 2017-11-21 NOTE — Progress Notes (Signed)
Zetia called in.

## 2017-11-21 NOTE — Progress Notes (Signed)
Chief Complaint  Patient presents with  . Annual Exam    Well Male Arthur Daniel is here for a complete physical.   His last physical was >1 year ago.  Current diet: in general, he eats what he wants to.   Current exercise: none, stays active with home projects Weight trend: has gained a little Daytime fatigue? No. Seat belt? Yes.    Health maintenance  Shingrix- No Colonoscopy- Yes Tetanus- No Prostate cancer screening- Yes Pneumonia vaccine- Yes  Past Medical History:  Diagnosis Date  . 10 year risk of MI or stroke 7.5% or greater 04/28/2016  . Allergic rhinitis, cause unspecified   . Atrial fibrillation ([place])     Associated with hyperthyroidism/Graves' disease  . BPH (benign prostatic hyperplasia) 10/18/2015  . Calculus of kidney   . Diverticulosis   . ED (erectile dysfunction)   . Essential hypertension, benign   . Hematuria, unspecified   . Hernia, umbilical   . History of chicken pox   . Lump or mass in breast   . Mixed hyperlipidemia   . Obesity   . Pacemaker    [place], [place] 2010  . Postablative hypothyroidism   . Sleep apnea    Use CPAP machine  . Ureteral stone    Dr. Risa Grill     Past Surgical History:  Procedure Laterality Date  . ANTERIOR CRUCIATE LIGAMENT REPAIR Right 1994  . CATARACT EXTRACTION, BILATERAL  08/2010  . COLONOSCOPY  2001, 2008  . FLEXIBLE SIGMOIDOSCOPY  2001  . FOOT SURGERY Left 2005   Fot repair after chainsaw accident  . INSERTION OF MESH N/A 08/31/2016   Procedure: INSERTION OF MESH;  Surgeon: Jackolyn Confer, MD;  Location: [facility];  Service: General;  Laterality: N/A;  . MENISCUS REPAIR Left 2004  . OTHER SURGICAL HISTORY  1995   Ear surgery due to mastoiditis  . PACEMAKER INSERTION  11/26/08   [name]  . SHOULDER ARTHROSCOPY WITH SUBACROMIAL DECOMPRESSION Left 10/20/2013   Procedure: LEFT SHOULDER SCOPE/DISTAL ACROMIOPLASTY, LABRAL DEBRIDMENT;  Surgeon: [name], MD;  Location: [facility];  Service:  Orthopedics;  Laterality: Left;  . SHOULDER SURGERY Right 11/2002  . SHOULDER SURGERY Right 2011  . THYROGLOSSAL DUCT CYST    . TONSILLECTOMY AND ADENOIDECTOMY    . UMBILICAL HERNIA REPAIR N/A 08/31/2016   Procedure: UMBILICAL HERNIA REPAIR WITH MESH;  Surgeon: Jackolyn Confer, MD;  Location: [facility];  Service: General;  Laterality: N/A;  . VASECTOMY      Medications  Current Outpatient Medications on File Prior to Visit  Medication Sig Dispense Refill  . alfuzosin (UROXATRAL) 10 MG 24 hr tablet Take 10 mg by mouth at bedtime.    . ALPRAZolam (XANAX) 0.5 MG tablet Take 1 tablet (0.5 mg total) by mouth daily as needed for anxiety. 30 tablet 0  . Flaxseed, Linseed, (FLAX SEED OIL) 1300 MG CAPS Take 1,300 mg by mouth daily.    [place] Kitchen levothyroxine (SYNTHROID, LEVOTHROID) 175 MCG tablet Take 1 tablet (175 mcg total) by mouth daily before breakfast. 90 tablet 1  . MYRBETRIQ 50 MG TB24 tablet Take 50 mg by mouth daily.     . NON FORMULARY C- PAP MACHINE    . Omega-3 Fatty Acids (FISH OIL) 1000 MG CPDR Take 1,000 mg by mouth daily.    [place] Kitchen OVER THE COUNTER MEDICATION Take 1 capsule by mouth daily. Amazing Grape Supplement    . pravastatin (PRAVACHOL) 40 MG tablet Take 1 tablet (40 mg total)  by mouth once a week. 30 tablet   . silver sulfADIAZINE (SILVADENE) 1 % cream Apply 1 application topically daily. Over burn. 50 g 0  . Tadalafil (CIALIS PO) Take by mouth as needed.     Allergies Allergies  Allergen Reactions  . Lipitor [Atorvastatin] Other (See Comments)    Breast lumps    Family History Family History  Problem Relation Age of Onset  . CVA Mother   . Hypercholesterolemia Mother   . Goiter Mother   . Diabetes Mother        DM  . Hypertension Mother   . CAD Mother   . Cirrhosis Father        due to alcohol  . Cancer Sister        Breast  . Hypercholesterolemia Brother   . Hypertension Brother   . CAD Brother   . Heart attack Son 40    Review of Systems: Constitutional:  no  fevers or chills Eye:  no recent significant change in vision Ear/Nose/Mouth/Throat:  Ears:  no recent hearing loss Nose/Mouth/Throat:  no complaints of nasal congestion or sore throat Cardiovascular:  no chest pain, no palpitations Respiratory:  no cough and no shortness of breath Gastrointestinal:  no abdominal pain, no change in bowel habits GU:  Male: negative for dysuria, frequency, and incontinence and negative for prostate symptoms Musculoskeletal/Extremities: +chronic shoulder pain; otherwise no pain, redness, or swelling of the joints Integumentary (Skin):  no abnormal skin lesions reported Neurologic:  no headaches, Endocrine:  No unexpected weight changes Hematologic/Lymphatic:  no areas of easy bruising  Exam BP 130/78 (BP Location: Left Arm, Patient Position: Sitting, Cuff Size: Large)   Pulse 73   Temp 98.3 F (36.8 C) (Oral)   Ht 6\' 1"  (1.854 m)   Wt (!) 303 lb 6 oz (137.6 kg)   SpO2 93%   BMI 40.03 kg/m  General:  well developed, well nourished, in no apparent distress Skin:  no significant moles, warts, or growths Head:  no masses, lesions, or tenderness Eyes:  pupils equal and round, sclera anicteric without injection Ears:  canals without lesions, TMs shiny without retraction, no obvious effusion, no erythema Nose:  nares patent, septum midline, mucosa normal Throat/Pharynx:  lips and gingiva without lesion; tongue and uvula midline; non-inflamed pharynx; no exudates or postnasal drainage Neck: neck supple without adenopathy, thyromegaly, or masses Lungs:  clear to auscultation, breath sounds equal bilaterally, no respiratory distress Cardio:  regular rate and rhythm, no LE edema or bruits Abdomen:  abdomen soft, nontender; moderately distended, bowel sounds normal; no masses or organomegaly Genital (male): circumcised penis, no lesions or discharge; testes present bilaterally without masses or tenderness Rectal: Deferred Musculoskeletal:  symmetrical muscle  groups noted without atrophy or deformity Extremities:  no clubbing, cyanosis, or edema, no deformities, no skin discoloration Neuro:  gait normal; deep tendon reflexes normal and symmetric Psych: well oriented with normal range of affect and appropriate judgment/insight  Assessment and Plan  Well adult exam - Plan: Lipid panel, Comprehensive metabolic panel  Screening for malignant neoplasm of prostate - Plan: PSA  Postablative hypothyroidism - Plan: levothyroxine (SYNTHROID, LEVOTHROID) 175 MCG tablet  Morbid obesity ([place])   Well 77 y.o. male. Counseled on diet and exercise. Counseled on risks and benefits of prostate cancer screening with PSA. Pt agrees to undergo testing.  Other orders as above. Follow up in in 6 mo or prn.  The patient voiced understanding and agreement to the plan.  [place], DO  11/21/17 8:37 AM

## 2017-11-21 NOTE — Patient Instructions (Addendum)
Give us 2-3 business days to get the results of your labs back.   Aim to do some physical exertion for 150 minutes per week. This is typically divided into 5 days per week, 30 minutes per day. The activity should be enough to get your heart rate up. Anything is better than nothing if you have time constraints.  Keep diet clean.  Let us know if you need anything.  

## 2017-11-21 NOTE — Addendum Note (Signed)
Addended by: Sharon Seller B on: 11/21/2017 09:29 AM   Modules accepted: Orders

## 2017-11-21 NOTE — Progress Notes (Signed)
Pre visit review using our clinic review tool, if applicable. No additional management support is needed unless otherwise documented below in the visit note. 

## 2017-11-26 ENCOUNTER — Telehealth: Payer: Self-pay | Admitting: Family Medicine

## 2017-11-26 NOTE — Telephone Encounter (Signed)
OK, we will stay on the Zetia, that is likely the medication that they said not to take it with. TY.

## 2017-11-26 NOTE — Telephone Encounter (Signed)
Copied from [place] [id number]. Topic: General - Other >> Nov 26, 2017  3:33 PM Leward Quan A wrote: Reason for CRM: Patient called to say that the  [place] Pitavastatin Calcium 1 MG TABS is $950 and he cannot afford that plus stated that one of the medications picked up at [facility] said do not take with

## 2017-11-26 NOTE — Telephone Encounter (Signed)
Called informed the patient of PCP instructions. He did agree and verbalize understanding.

## 2017-11-27 ENCOUNTER — Encounter: Payer: Medicare HMO | Admitting: Internal Medicine

## 2017-11-27 ENCOUNTER — Telehealth: Payer: Self-pay | Admitting: Internal Medicine

## 2017-11-27 ENCOUNTER — Ambulatory Visit (INDEPENDENT_AMBULATORY_CARE_PROVIDER_SITE_OTHER): Payer: Medicare HMO | Admitting: *Deleted

## 2017-11-27 DIAGNOSIS — I495 Sick sinus syndrome: Secondary | ICD-10-CM

## 2017-11-27 NOTE — Telephone Encounter (Signed)
Patient states he fells three weeks again and landed on the area where is pacemaker is at.  He states the area is still sore and it still hurst him there.  He is wonder if he damage the pacemaker. He wants to be seen.    I scheduled him with Dr. [name] today at 3pm.

## 2017-11-28 LAB — CUP PACEART INCLINIC DEVICE CHECK
Battery Remaining Longevity: 26 mo
Brady Statistic RA Percent Paced: 23 %
Date Time Interrogation Session: 20191112205935
Implantable Lead Location: 753859
Lead Channel Impedance Value: 325 Ohm
Lead Channel Pacing Threshold Amplitude: 1 V
Lead Channel Pacing Threshold Amplitude: 1.5 V
Lead Channel Pacing Threshold Amplitude: 1.5 V
Lead Channel Pacing Threshold Pulse Width: 0.4 ms
Lead Channel Pacing Threshold Pulse Width: 0.4 ms
Lead Channel Setting Pacing Pulse Width: 0.4 ms
Lead Channel Setting Sensing Sensitivity: 2 mV
MDC IDC LEAD IMPLANT DT: 20101111
MDC IDC LEAD IMPLANT DT: 20101111
MDC IDC LEAD LOCATION: 753860
MDC IDC MSMT BATTERY VOLTAGE: 2.77 V
MDC IDC MSMT LEADCHNL RA PACING THRESHOLD AMPLITUDE: 1 V
MDC IDC MSMT LEADCHNL RA SENSING INTR AMPL: 4 mV
MDC IDC MSMT LEADCHNL RV IMPEDANCE VALUE: 350 Ohm
MDC IDC MSMT LEADCHNL RV PACING THRESHOLD PULSEWIDTH: 0.4 ms
MDC IDC MSMT LEADCHNL RV PACING THRESHOLD PULSEWIDTH: 0.4 ms
MDC IDC MSMT LEADCHNL RV SENSING INTR AMPL: 7.9 mV
MDC IDC PG IMPLANT DT: 20101111
MDC IDC PG SERIAL: 7079515
MDC IDC SET LEADCHNL RA PACING AMPLITUDE: 2 V
MDC IDC SET LEADCHNL RV PACING AMPLITUDE: 2.5 V
MDC IDC STAT BRADY RV PERCENT PACED: 0.66 %
Pulse Gen Model: 2210

## 2017-11-28 NOTE — Addendum Note (Signed)
Addended by: Wanda Plump on: 11/28/2017 08:54 AM   Modules accepted: Level of Service

## 2017-11-28 NOTE — Progress Notes (Signed)
Pt seen d/t c/o of soreness after a fall 3 weeks ago. No bruising, no edema noted at pacemaker implant site. Normal device function. Thresholds, sensing, impedances consistent with previous measurements. Pt to f/u as scheduled.

## 2017-12-27 ENCOUNTER — Ambulatory Visit (HOSPITAL_BASED_OUTPATIENT_CLINIC_OR_DEPARTMENT_OTHER)
Admission: RE | Admit: 2017-12-27 | Discharge: 2017-12-27 | Disposition: A | Discharge status: Home / Self Care | Payer: Medicare HMO | Source: Ambulatory Visit | Attending: Family Medicine | Admitting: Family Medicine

## 2017-12-27 ENCOUNTER — Ambulatory Visit (INDEPENDENT_AMBULATORY_CARE_PROVIDER_SITE_OTHER): Payer: Medicare HMO

## 2017-12-27 ENCOUNTER — Ambulatory Visit (INDEPENDENT_AMBULATORY_CARE_PROVIDER_SITE_OTHER): Payer: Medicare HMO | Admitting: Family Medicine

## 2017-12-27 ENCOUNTER — Encounter: Payer: Self-pay | Admitting: Family Medicine

## 2017-12-27 VITALS — BP 132/82 | HR 71 | Temp 99.1°F | Ht 73.0 in | Wt 308.1 lb

## 2017-12-27 DIAGNOSIS — R0789 Other chest pain: Secondary | ICD-10-CM

## 2017-12-27 DIAGNOSIS — R0602 Shortness of breath: Secondary | ICD-10-CM | POA: Diagnosis not present

## 2017-12-27 DIAGNOSIS — J208 Acute bronchitis due to other specified organisms: Secondary | ICD-10-CM | POA: Insufficient documentation

## 2017-12-27 DIAGNOSIS — B9689 Other specified bacterial agents as the cause of diseases classified elsewhere: Secondary | ICD-10-CM | POA: Insufficient documentation

## 2017-12-27 DIAGNOSIS — I495 Sick sinus syndrome: Secondary | ICD-10-CM | POA: Diagnosis not present

## 2017-12-27 DIAGNOSIS — R079 Chest pain, unspecified: Secondary | ICD-10-CM | POA: Diagnosis not present

## 2017-12-27 MED ORDER — AZITHROMYCIN 250 MG PO TABS: ORAL_TABLET | ORAL | 0 refills | Status: DC

## 2017-12-27 MED ORDER — EZETIMIBE 10 MG PO TABS: 10.0000 mg | ORAL_TABLET | Freq: Every day | ORAL | 2 refills | Status: DC

## 2017-12-27 MED ORDER — METHYLPREDNISOLONE ACETATE 80 MG/ML IJ SUSP
80.0000 mg | Freq: Once | INTRAMUSCULAR | Status: AC
  Administered 2017-12-27: 80 mg via INTRAMUSCULAR

## 2017-12-27 NOTE — Patient Instructions (Addendum)
Continue to push fluids, practice good hand hygiene, and cover your mouth if you cough.  If you start having fevers, shaking or shortness of breath, seek immediate care.  For symptoms, consider using Vick's VapoRub on chest or under nose, air humidifier, Benadryl at night, and elevating the head of the bed. Tylenol and ibuprofen for aches and pains you may be experiencing.   We will be in touch regarding your X-ray.   Don't take azithromycin until Saturday and only if you aren't feeling better.  Let us know if you need anything.

## 2017-12-27 NOTE — Progress Notes (Signed)
Chief Complaint  Patient presents with  . Cough    congestion    Arthur Daniel here for URI complaints.  Duration: 3 weeks  Associated symptoms: wheezing, shortness of breath, chest tightness and cough Denies: sinus congestion, sinus pain, rhinorrhea, itchy watery eyes, ear pain, ear drainage, sore throat and fevers Treatment to date: Alka-Seltzer Plus, brandy Sick contacts: No   He also reports a greater than 1 year history of right-sided chest pain.  It comes and goes.  He has a history of musculoskeletal pain related to statin myopathy, however he has been off of that medicine for several months.  He denies any injury, bruising, or skin changes.  He does have a family history of lung cancer and is concerned about this.  ROS:  Const: Denies fevers HEENT: As noted in HPI Lungs: +SOB  Past Medical History:  Diagnosis Date  . 10 year risk of MI or stroke 7.5% or greater 04/28/2016  . Allergic rhinitis, cause unspecified   . Atrial fibrillation ([place])     Associated with hyperthyroidism/Graves' disease  . BPH (benign prostatic hyperplasia) 10/18/2015  . Calculus of kidney   . Diverticulosis   . ED (erectile dysfunction)   . Essential hypertension, benign   . Hematuria, unspecified   . Hernia, umbilical   . History of chicken pox   . Lump or mass in breast   . Mixed hyperlipidemia   . Obesity   . Pacemaker    [place], [place] 2010  . Postablative hypothyroidism   . Sleep apnea    Use CPAP machine  . Ureteral stone    Dr. Risa Grill    BP 132/82 (BP Location: Left Arm, Patient Position: Sitting, Cuff Size: Large)   Pulse 71   Temp 99.1 F (37.3 C) (Oral)   Ht 6\' 1"  (1.854 m)   Wt (!) 308 lb 2 oz (139.8 kg)   SpO2 95%   BMI 40.65 kg/m  General: Awake, alert, appears stated age HEENT: AT, [place], ears patent b/l and TM's neg, nares patent w/o discharge, pharynx pink and without exudates, MMM Neck: No masses or asymmetry Heart: RRR Lungs: CTAB, no accessory muscle use MSK:  No tenderness to palpation over the right chest wall Psych: Age appropriate judgment and insight, normal mood and affect  Acute bacterial bronchitis - Plan: DG Chest 2 View, azithromycin (ZITHROMAX) 250 MG tablet, methylPREDNISolone acetate (DEPO-MEDROL) injection 80 mg  Chest wall pain - Plan: DG Chest 2 View  Orders as above. Zpak if no improvement over next couple days.  Continue to push fluids, practice good hand hygiene, cover mouth when coughing. Check chest x-ray for chest wall pain.  If no improvement, will consider costochondritis versus pleuritis. F/u prn. If starting to experience fevers, shaking, or shortness of breath, seek immediate care. Pt voiced understanding and agreement to the plan.  [place], DO 12/27/17 2:31 PM

## 2017-12-27 NOTE — Progress Notes (Signed)
Pre visit review using our clinic review tool, if applicable. No additional management support is needed unless otherwise documented below in the visit note. 

## 2017-12-28 ENCOUNTER — Ambulatory Visit: Payer: Medicare HMO | Admitting: Family Medicine

## 2017-12-28 ENCOUNTER — Encounter: Payer: Self-pay | Admitting: Cardiology

## 2017-12-28 NOTE — Progress Notes (Signed)
Remote pacemaker transmission.   

## 2018-01-10 ENCOUNTER — Ambulatory Visit (INDEPENDENT_AMBULATORY_CARE_PROVIDER_SITE_OTHER): Payer: Medicare HMO | Admitting: Family Medicine

## 2018-01-10 ENCOUNTER — Encounter: Payer: Self-pay | Admitting: Family Medicine

## 2018-01-10 VITALS — BP 130/82 | HR 76 | Temp 97.9°F | Resp 18 | Ht 73.0 in | Wt 301.0 lb

## 2018-01-10 DIAGNOSIS — J439 Emphysema, unspecified: Secondary | ICD-10-CM

## 2018-01-10 DIAGNOSIS — J309 Allergic rhinitis, unspecified: Secondary | ICD-10-CM

## 2018-01-10 MED ORDER — ALPRAZOLAM 0.5 MG PO TABS: 0.5000 mg | ORAL_TABLET | Freq: Every day | ORAL | 0 refills | Status: DC | PRN

## 2018-01-10 MED ORDER — PREDNISONE 20 MG PO TABS: 40.0000 mg | ORAL_TABLET | Freq: Every day | ORAL | 0 refills | Status: AC

## 2018-01-10 MED ORDER — LEVOCETIRIZINE DIHYDROCHLORIDE 5 MG PO TABS: 5.0000 mg | ORAL_TABLET | Freq: Every evening | ORAL | 2 refills | Status: AC

## 2018-01-10 MED ORDER — IPRATROPIUM-ALBUTEROL 0.5-2.5 (3) MG/3ML IN SOLN
3.0000 mL | Freq: Once | RESPIRATORY_TRACT | Status: AC
  Administered 2018-01-10: 3 mL via RESPIRATORY_TRACT

## 2018-01-10 NOTE — Progress Notes (Addendum)
Chief Complaint  Patient presents with  . Cough    bronchitis follow up, wheezing, cough-worse when laying down, possible allergy?    Arthur Daniel here for URI complaints.  F/u from 12/12.  Associated symptoms: wheezing, shortness of breath, nausesa congestion, itchy eyes and cough Denies: sinus pain, ear pain, ear drainage, sore throat and myalgia Treatment to date: Zpak, depo inj Sick contacts: No  ROS:  Const: Denies fevers HEENT: As noted in HPI Lungs: No SOB  Past Medical History:  Diagnosis Date  . 10 year risk of MI or stroke 7.5% or greater 04/28/2016  . Allergic rhinitis, cause unspecified   . Atrial fibrillation ([place])     Associated with hyperthyroidism/Graves' disease  . BPH (benign prostatic hyperplasia) 10/18/2015  . Calculus of kidney   . Diverticulosis   . ED (erectile dysfunction)   . Essential hypertension, benign   . Hematuria, unspecified   . Hernia, umbilical   . History of chicken pox   . Lump or mass in breast   . Mixed hyperlipidemia   . Obesity   . Pacemaker    [place], [place] 2010  . Postablative hypothyroidism   . Sleep apnea    Use CPAP machine  . Ureteral stone    Dr. Risa Grill    BP 130/82 (BP Location: Right Arm, Patient Position: Sitting, Cuff Size: Large)   Pulse 76   Temp 97.9 F (36.6 C) (Oral)   Resp 18   Ht 6\' 1"  (1.854 m)   Wt (!) 301 lb (136.5 kg)   SpO2 96%   BMI 39.71 kg/m  General: Awake, alert, appears stated age HEENT: AT, [place], ears patent b/l and TM on L neg; retracted with scarring on R (chronic), nares patent w/o discharge, pharynx pink and without exudates, MMM; sclera injected b/l Neck: No masses or asymmetry Heart: RRR Lungs: CTAB, no accessory muscle use Psych: Age appropriate judgment and insight, normal mood and affect  Pulmonary emphysema, unspecified emphysema type (H[facility]) - Plan: ipratropium-albuterol (DUONEB) 0.5-2.5 (3) MG/3ML nebulizer solution 3 mL, predniSONE (DELTASONE) 20 MG tablet  Allergic  rhinitis, unspecified seasonality, unspecified trigger - Plan: levocetirizine (XYZAL) 5 MG tablet  Did not do better after breathing tx. Pred burst for 5 d, Xyzal Continue to push fluids, practice good hand hygiene, cover mouth when coughing. F/u in 2 weeks if no better. Will consider referral to pulm vs starting inhaler therapy for emphysema.  Pt voiced understanding and agreement to the plan.  [place], DO 01/10/18 11:04 AM

## 2018-01-10 NOTE — Patient Instructions (Addendum)
Continue to push fluids, practice good hand hygiene, and cover your mouth if you cough.  If you start having fevers, shaking or shortness of breath, seek immediate care.  We will be in touch regarding your X-ray. Further treatment may follow depending on the results.  For symptoms, consider using Vick's VapoRub on chest or under nose, air humidifier, Benadryl at night, and elevating the head of the bed. Tylenol and ibuprofen for aches and pains you may be experiencing.   Cancel appointment if you are doing better.  Let us know if you need anything.

## 2018-01-24 ENCOUNTER — Ambulatory Visit: Payer: Medicare HMO | Admitting: Family Medicine

## 2018-02-06 DIAGNOSIS — H95191 Other disorders following mastoidectomy, right ear: Secondary | ICD-10-CM | POA: Diagnosis not present

## 2018-02-06 DIAGNOSIS — Z9089 Acquired absence of other organs: Secondary | ICD-10-CM | POA: Diagnosis not present

## 2018-02-09 LAB — CUP PACEART REMOTE DEVICE CHECK
Battery Remaining Longevity: 18 mo
Battery Remaining Percentage: 15 %
Battery Voltage: 2.74 V
Brady Statistic AP VS Percent: 21 %
Brady Statistic AS VP Percent: 1 %
Brady Statistic AS VS Percent: 78 %
Brady Statistic RA Percent Paced: 20 %
Date Time Interrogation Session: 20191212073248
Implantable Lead Implant Date: 20101111
Implantable Lead Implant Date: 20101111
Implantable Lead Location: 753859
Implantable Lead Location: 753860
Implantable Pulse Generator Implant Date: 20101111
Lead Channel Impedance Value: 340 Ohm
Lead Channel Pacing Threshold Amplitude: 1 V
Lead Channel Pacing Threshold Amplitude: 1.5 V
Lead Channel Pacing Threshold Pulse Width: 0.4 ms
Lead Channel Sensing Intrinsic Amplitude: 3.6 mV
Lead Channel Sensing Intrinsic Amplitude: 7.6 mV
Lead Channel Setting Pacing Amplitude: 2 V
Lead Channel Setting Pacing Amplitude: 2.5 V
Lead Channel Setting Pacing Pulse Width: 0.4 ms
Lead Channel Setting Sensing Sensitivity: 2 mV
MDC IDC MSMT LEADCHNL RV IMPEDANCE VALUE: 400 Ohm
MDC IDC MSMT LEADCHNL RV PACING THRESHOLD PULSEWIDTH: 0.4 ms
MDC IDC STAT BRADY AP VP PERCENT: 1 %
MDC IDC STAT BRADY RV PERCENT PACED: 1 %
Pulse Gen Model: 2210
Pulse Gen Serial Number: 7079515

## 2018-02-12 DIAGNOSIS — G4733 Obstructive sleep apnea (adult) (pediatric): Secondary | ICD-10-CM | POA: Diagnosis not present

## 2018-03-18 ENCOUNTER — Other Ambulatory Visit: Payer: Self-pay | Admitting: Family Medicine

## 2018-03-28 ENCOUNTER — Encounter: Payer: Medicare HMO | Admitting: *Deleted

## 2018-03-29 ENCOUNTER — Telehealth: Payer: Self-pay | Admitting: Internal Medicine

## 2018-03-29 ENCOUNTER — Telehealth: Payer: Self-pay

## 2018-03-29 NOTE — Telephone Encounter (Signed)
Left message for patient to remind of missed remote transmission.  

## 2018-03-29 NOTE — Telephone Encounter (Signed)
  Patient is having issues with sending transmission. Please call

## 2018-03-31 ENCOUNTER — Emergency Department (HOSPITAL_BASED_OUTPATIENT_CLINIC_OR_DEPARTMENT_OTHER)
Admission: EM | Admit: 2018-03-31 | Discharge: 2018-03-31 | Disposition: A | Discharge status: Home / Self Care | Payer: Medicare HMO | Attending: Emergency Medicine | Admitting: Emergency Medicine

## 2018-03-31 ENCOUNTER — Other Ambulatory Visit: Payer: Self-pay

## 2018-03-31 ENCOUNTER — Emergency Department (HOSPITAL_BASED_OUTPATIENT_CLINIC_OR_DEPARTMENT_OTHER): Payer: Medicare HMO

## 2018-03-31 ENCOUNTER — Encounter (HOSPITAL_BASED_OUTPATIENT_CLINIC_OR_DEPARTMENT_OTHER): Payer: Self-pay

## 2018-03-31 DIAGNOSIS — Z79899 Other long term (current) drug therapy: Secondary | ICD-10-CM | POA: Insufficient documentation

## 2018-03-31 DIAGNOSIS — Z87891 Personal history of nicotine dependence: Secondary | ICD-10-CM | POA: Insufficient documentation

## 2018-03-31 DIAGNOSIS — E039 Hypothyroidism, unspecified: Secondary | ICD-10-CM | POA: Diagnosis not present

## 2018-03-31 DIAGNOSIS — I10 Essential (primary) hypertension: Secondary | ICD-10-CM | POA: Insufficient documentation

## 2018-03-31 DIAGNOSIS — R0602 Shortness of breath: Secondary | ICD-10-CM | POA: Diagnosis not present

## 2018-03-31 DIAGNOSIS — Z7901 Long term (current) use of anticoagulants: Secondary | ICD-10-CM | POA: Insufficient documentation

## 2018-03-31 DIAGNOSIS — I480 Paroxysmal atrial fibrillation: Secondary | ICD-10-CM

## 2018-03-31 DIAGNOSIS — R000 Tachycardia, unspecified: Secondary | ICD-10-CM | POA: Diagnosis not present

## 2018-03-31 DIAGNOSIS — Z950 Presence of cardiac pacemaker: Secondary | ICD-10-CM | POA: Diagnosis not present

## 2018-03-31 DIAGNOSIS — R079 Chest pain, unspecified: Secondary | ICD-10-CM | POA: Diagnosis not present

## 2018-03-31 DIAGNOSIS — R0789 Other chest pain: Secondary | ICD-10-CM | POA: Diagnosis present

## 2018-03-31 LAB — BASIC METABOLIC PANEL
Anion gap: 7 (ref 5–15)
BUN: 18 mg/dL (ref 8–23)
CO2: 26 mmol/L (ref 22–32)
CREATININE: 1.13 mg/dL (ref 0.61–1.24)
Calcium: 9.1 mg/dL (ref 8.9–10.3)
Chloride: 107 mmol/L (ref 98–111)
GFR calc Af Amer: >60 mL/min (ref >60)
Glucose, Bld: 139 mg/dL — ABNORMAL HIGH (ref 70–99)
Potassium: 3.3 mmol/L — ABNORMAL LOW (ref 3.5–5.1)
Sodium: 140 mmol/L (ref 135–145)

## 2018-03-31 LAB — CBC
HCT: 45.2 % (ref 39.0–52.0)
Hemoglobin: 14.2 g/dL (ref 13.0–17.0)
MCH: 30.9 pg (ref 26.0–34.0)
MCHC: 31.4 g/dL (ref 30.0–36.0)
MCV: 98.5 fL (ref 80.0–100.0)
Platelets: 138 10*3/uL — ABNORMAL LOW (ref 150–400)
RBC: 4.59 MIL/uL (ref 4.22–5.81)
RDW: 13 % (ref 11.5–15.5)
WBC: 6.6 10*3/uL (ref 4.0–10.5)
nRBC: 0 % (ref 0.0–0.2)

## 2018-03-31 LAB — TROPONIN I
Troponin I: <0.03 ng/mL (ref <0.03)
Troponin I: <0.03 ng/mL (ref <0.03)

## 2018-03-31 LAB — BRAIN NATRIURETIC PEPTIDE: B Natriuretic Peptide: 42.1 pg/mL (ref 0.0–100.0)

## 2018-03-31 LAB — TSH: TSH: 4.339 u[IU]/mL (ref 0.350–4.500)

## 2018-03-31 MED ORDER — METOPROLOL SUCCINATE ER 25 MG PO TB24: 25.0000 mg | ORAL_TABLET | Freq: Every day | ORAL | 0 refills | Status: DC

## 2018-03-31 MED ORDER — APIXABAN 5 MG PO TABS: 5.0000 mg | ORAL_TABLET | Freq: Two times a day (BID) | ORAL | 0 refills | Status: DC

## 2018-03-31 MED ORDER — APIXABAN 2.5 MG PO TABS
5.0000 mg | ORAL_TABLET | Freq: Once | ORAL | Status: AC
  Administered 2018-03-31: 5 mg via ORAL
  Filled 2018-03-31: qty 2

## 2018-03-31 NOTE — Progress Notes (Signed)
Cardiology Moonlighter Note  Returned page from ED at [facility]. Patient presenting w/ symptomatic paroxysms of AF. Now in NSR and asymptomatic. Was not previously anticoagulated given AF was thought to be caused by hyperthyroidism.  ED provider believes patient is stable for discharge home. Recommended ED recheck thyroid studies. Recommended starting apixaban for anticoagulation and metoprolol succinate 25mg  daily for rate control. Pt has dual chamber ppm in place. Has appointment to see Dr. [name] in 4 days. Will cc Dr [name] on this note.  Marcie Mowers, MD Cardiology Fellow, PGY-6

## 2018-03-31 NOTE — ED Notes (Signed)
Pt has a Technical sales engineer.

## 2018-03-31 NOTE — ED Triage Notes (Signed)
Pt c/o tightness in chest, ShOB that started yesterday. Pt states he has a pacemaker in place but there has been technical difficulties in sending the data.

## 2018-03-31 NOTE — ED Provider Notes (Signed)
[facility] EMERGENCY DEPARTMENT Provider Note   CSN: [id number] Arrival date & time: 03/31/18  1645    History   Chief Complaint No chief complaint on file.   HPI Arthur Daniel is a 78 y.o. male.     78yo M w/ extensive PMH including pacemaker, HLD, OSA on CPAP, A fib not currently anticoagulated, thyroid problems who p/w chest pain and shortness of breath.  Yesterday he began having central, nonradiating tightness in his chest associated with some shortness of breath.  He has also had some heart racing with it.  Symptoms have been coming and going and not related to exertion.  He has not had any associated nausea, vomiting, or diaphoresis.  Denies any cough/URI symptoms.  Denies orthopnea, PND, or lower extremity edema.  Is been compliant with his medications.  No recent travel.  No fevers.  The history is provided by the patient.    Past Medical History:  Diagnosis Date  . 10 year risk of MI or stroke 7.5% or greater 04/28/2016  . Allergic rhinitis, cause unspecified   . Atrial fibrillation ([place])     Associated with hyperthyroidism/Graves' disease  . BPH (benign prostatic hyperplasia) 10/18/2015  . Calculus of kidney   . Diverticulosis   . ED (erectile dysfunction)   . Essential hypertension, benign   . Hematuria, unspecified   . Hernia, umbilical   . History of chicken pox   . Lump or mass in breast   . Mixed hyperlipidemia   . Obesity   . Pacemaker    [place], [place] 2010  . Postablative hypothyroidism   . Sleep apnea    Use CPAP machine  . Ureteral stone    Dr. Risa Grill    Patient Active Problem List   Diagnosis Date Noted  . Pulmonary emphysema ([place]) 01/10/2018  . Morbid obesity ([place]) 11/21/2017  . Plantar fasciitis 08/17/2017  . Chronic heel pain, left 08/08/2017  . Neoplasm of uncertain behavior 53/29/9242  . 10 year risk of MI or stroke 7.5% or greater 04/28/2016  . BPH (benign prostatic hyperplasia) 10/18/2015  . Other malaise and fatigue  07/17/2013  . Chest pain, atypical 06/25/2013  . Hypothyroidism 02/19/2009  . Essential hypertension 02/19/2009  . ATRIAL FIBRILLATION 11/30/2008  . Cardiac pacemaker in situ 11/30/2008    Past Surgical History:  Procedure Laterality Date  . ANTERIOR CRUCIATE LIGAMENT REPAIR Right 1994  . CATARACT EXTRACTION, BILATERAL  08/2010  . COLONOSCOPY  2001, 2008  . FLEXIBLE SIGMOIDOSCOPY  2001  . FOOT SURGERY Left 2005   Fot repair after chainsaw accident  . INSERTION OF MESH N/A 08/31/2016   Procedure: INSERTION OF MESH;  Surgeon: Jackolyn Confer, MD;  Location: [facility];  Service: General;  Laterality: N/A;  . MENISCUS REPAIR Left 2004  . OTHER SURGICAL HISTORY  1995   Ear surgery due to mastoiditis  . PACEMAKER INSERTION  11/26/08   [name]  . SHOULDER ARTHROSCOPY WITH SUBACROMIAL DECOMPRESSION Left 10/20/2013   Procedure: LEFT SHOULDER SCOPE/DISTAL ACROMIOPLASTY, LABRAL DEBRIDMENT;  Surgeon: [name], MD;  Location: [facility];  Service: Orthopedics;  Laterality: Left;  . SHOULDER SURGERY Right 11/2002  . SHOULDER SURGERY Right 2011  . THYROGLOSSAL DUCT CYST    . TONSILLECTOMY AND ADENOIDECTOMY    . UMBILICAL HERNIA REPAIR N/A 08/31/2016   Procedure: UMBILICAL HERNIA REPAIR WITH MESH;  Surgeon: Jackolyn Confer, MD;  Location: [facility];  Service: General;  Laterality: N/A;  . VASECTOMY  Home Medications    Prior to Admission medications   Medication Sig Start Date End Date Taking? Authorizing Provider  alfuzosin (UROXATRAL) 10 MG 24 hr tablet Take 10 mg by mouth at bedtime.    [provider]  ALPRAZolam Duanne Moron) 0.5 MG tablet Take 1 tablet (0.5 mg total) by mouth daily as needed for anxiety. 01/10/18   Shelda Pal, DO  apixaban (ELIQUIS) 5 MG TABS tablet Take 1 tablet (5 mg total) by mouth 2 (two) times daily for 30 days. 03/31/18 04/30/18  Maxemiliano Riel, Wenda Overland, MD  ezetimibe (ZETIA) 10 MG tablet TAKE 1 TABLET BY MOUTH EVERY DAY 03/18/18    Wendling, Crosby Oyster, DO  Flaxseed, Linseed, (FLAX SEED OIL) 1300 MG CAPS Take 1,300 mg by mouth daily.    [provider]  levocetirizine (XYZAL) 5 MG tablet Take 1 tablet (5 mg total) by mouth every evening. 01/10/18   Shelda Pal, DO  levothyroxine (SYNTHROID, LEVOTHROID) 175 MCG tablet Take 1 tablet (175 mcg total) by mouth daily before breakfast. 11/21/17   Wendling, Crosby Oyster, DO  metoprolol succinate (TOPROL XL) 25 MG 24 hr tablet Take 1 tablet (25 mg total) by mouth daily. 03/31/18   Khamiyah Grefe, Wenda Overland, MD  MYRBETRIQ 50 MG TB24 tablet Take 50 mg by mouth daily.  04/10/16   [provider]  NON FORMULARY C- PAP MACHINE    [provider]  Omega-3 Fatty Acids (FISH OIL) 1000 MG CPDR Take 1,000 mg by mouth daily.    [provider]  OVER THE COUNTER MEDICATION Take 1 capsule by mouth daily. Amazing Grape Supplement    [provider]  silver sulfADIAZINE (SILVADENE) 1 % cream Apply 1 application topically daily. Over burn. 08/08/17   Shelda Pal, DO  Tadalafil (CIALIS PO) Take by mouth as needed.    [provider]    Family History Family History  Problem Relation Age of Onset  . CVA Mother   . Hypercholesterolemia Mother   . Goiter Mother   . Diabetes Mother        DM  . Hypertension Mother   . CAD Mother   . Cirrhosis Father        due to alcohol  . Cancer Sister        Breast  . Hypercholesterolemia Brother   . Hypertension Brother   . CAD Brother   . Heart attack Son 32    Social History Social History   Tobacco Use  . Smoking status: Former Smoker    Packs/day: 1.00    Years: 55.00    Pack years: 55.00    Types: Cigarettes    Last attempt to quit: 01/16/2006    Years since quitting: 12.2  . Smokeless tobacco: Former Systems developer    Types: Chew    Quit date: 09/16/2013  . Tobacco comment: 1/2-1 pack a day  Substance Use Topics  . Alcohol use: Yes    Comment: rarely drinks wine  . Drug  use: No     Allergies   Lipitor [atorvastatin]   Review of Systems Review of Systems All other systems reviewed and are negative except that which was mentioned in HPI   Physical Exam Updated Vital Signs BP (!) 142/81   Pulse 60   Temp 98.4 F (36.9 C)   Resp (!) 21   Ht 6\' 2"  (1.88 m)   Wt 136.1 kg   SpO2 100%   BMI 38.52 kg/m   Physical Exam Vitals  signs and nursing note reviewed.  Constitutional:      General: He is not in acute distress.    Appearance: He is well-developed.  HENT:     Head: Normocephalic and atraumatic.     Nose: Nose normal.     Mouth/Throat:     Mouth: Mucous membranes are moist.  Eyes:     Conjunctiva/sclera: Conjunctivae normal.  Neck:     Musculoskeletal: Neck supple.  Cardiovascular:     Rate and Rhythm: Normal rate and regular rhythm.     Heart sounds: Normal heart sounds. No murmur.  Pulmonary:     Effort: Pulmonary effort is normal.     Breath sounds: Normal breath sounds. No rales.  Abdominal:     General: Bowel sounds are normal. There is no distension.     Palpations: Abdomen is soft.     Tenderness: There is no abdominal tenderness.  Musculoskeletal:     Right lower leg: Edema present.     Left lower leg: Edema present.     Comments: Mild edema BLE  Skin:    General: Skin is warm and dry.  Neurological:     Mental Status: He is alert and oriented to person, place, and time.     Comments: Fluent speech  Psychiatric:        Judgment: Judgment normal.      ED Treatments / Results  Labs (all labs ordered are listed, but only abnormal results are displayed) Labs Reviewed  BASIC METABOLIC PANEL - Abnormal; Notable for the following components:      Result Value   Potassium 3.3 (*)    Glucose, Bld 139 (*)    All other components within normal limits  CBC - Abnormal; Notable for the following components:   Platelets 138 (*)    All other components within normal limits  TROPONIN I  BRAIN NATRIURETIC PEPTIDE  TSH   TROPONIN I    EKG EKG Interpretation  Date/Time:  Sunday March 31 2018 17:13:39 EDT Ventricular Rate:  96 PR Interval:    QRS Duration: 97 QT Interval:  369 QTC Calculation: 467 R Axis:   -38 Text Interpretation:  Sinus rhythm Prolonged PR interval Inferior infarct, old Abnrm R prog, consider ASMI or lead placement Baseline wander in lead(s) V2 since previous tracing, has converted to sinus rhythm Confirmed by Theotis Burrow [phone]) on 03/31/2018 5:46:00 PM   Radiology Dg Chest 2 View  Result Date: 03/31/2018 CLINICAL DATA:  Shortness of breath and chest pain EXAM: CHEST - 2 VIEW COMPARISON:  December 27, 2017 FINDINGS: There is mild scarring in the left base. There is no edema or consolidation. The heart size and pulmonary vascularity are normal. Pacemaker leads are attached to the right atrium and right ventricle. There is aortic atherosclerosis. There is mild degenerative change in the thoracic spine. IMPRESSION: Mild scarring left base. No edema or consolidation. Stable cardiac silhouette. Pacemaker leads attached to right atrium and right ventricle. Aortic Atherosclerosis (ICD10-I70.0). Electronically Signed   By: Lowella Grip III M.D.   On: 03/31/2018 17:16    Procedures Procedures (including critical care time)  Medications Ordered in ED Medications  apixaban (ELIQUIS) tablet 5 mg (5 mg Oral Given 03/31/18 2215)      CHA2DS2/VAS Stroke Risk Points  Current as of 10 minutes ago     3 >= 2 Points: High Risk  1 - 1.99 Points: Medium Risk  0 Points: Low Risk    The patient's score has not changed  in the past year.:  No Change     Details    This score determines the patient's risk of having a stroke if the  patient has atrial fibrillation.       Points Metrics  0 Has Congestive Heart Failure:  No    Current as of 10 minutes ago  0 Has Vascular Disease:  No    Current as of 10 minutes ago  1 Has Hypertension:  Yes    Current as of 10 minutes ago  2 Age:  84     Current as of 10 minutes ago  0 Has Diabetes:  No    Current as of 10 minutes ago  0 Had Stroke:  No  Had TIA:  No  Had thromboembolism:  No    Current as of 10 minutes ago  0 Male:  No    Current as of 10 minutes ago         Initial Impression / Assessment and Plan / ED Course  I have reviewed the triage vital signs and the nursing notes.  Pertinent labs & imaging results that were available during my care of the patient were reviewed by me and considered in my medical decision making (see chart for details).        Comfortable on exam.  Initially his EKG showed atrial fibrillation with RVR, rate 130s to 140s.  He then converted shortly afterwards to sinus rhythm with rate in the 90s.  No obvious signs of volume overload.  Denying any complaints during my examination when he was in sinus rhythm.   Lab work shows negative serial troponins, normal TSH, normal BMP and CBC.  Chest x-ray negative acute.  Pacemaker interrogation shows 5 episodes of atrial fibrillation today.  I suspect A. fib is causing his symptoms.  Previously, his cardiologist did not recommend anticoagulation because his atrial fibrillation was related to thyroid problems, however today his CHADSVASC score is 3.  I discussed with cardiologist on-call, Dr. Emilio Aspen, who agreed with plan to initiate Eliquis and he recommended metoprolol daily.  The patient has confirmed that he does not currently take aspirin and has no history of bleeding problems or GI bleed.  He has remained in sinus rhythm with normal rate for several hours in the ED.  He is also remained asymptomatic.  I have discussed medications including risks and benefits of anticoagulation and patient will follow-up with his cardiologist Dr. [name] this week.  I have extensively reviewed return precautions and he voiced understanding.  Final Clinical Impressions(s) / ED Diagnoses   Final diagnoses:  Paroxysmal atrial fibrillation with rapid ventricular response  [facility])    ED Discharge Orders         Ordered    apixaban (ELIQUIS) 5 MG TABS tablet  2 times daily     03/31/18 2209    metoprolol succinate (TOPROL XL) 25 MG 24 hr tablet  Daily     03/31/18 2209           Taje Littler, Wenda Overland, MD 03/31/18 2228

## 2018-04-01 ENCOUNTER — Telehealth: Payer: Self-pay | Admitting: Cardiology

## 2018-04-01 ENCOUNTER — Ambulatory Visit (INDEPENDENT_AMBULATORY_CARE_PROVIDER_SITE_OTHER): Payer: Medicare HMO | Admitting: *Deleted

## 2018-04-01 DIAGNOSIS — I495 Sick sinus syndrome: Secondary | ICD-10-CM | POA: Diagnosis not present

## 2018-04-01 NOTE — Telephone Encounter (Signed)
° °  Patient states he is returning a call. Advised patient Dr Olin Pia scheduler will reach out to him with appt.   Patient states he was told to get an f/u from ED appointment for today.

## 2018-04-01 NOTE — Telephone Encounter (Signed)
Patient called and stated that his home monitor is not working and he wanted an appt to come to the office to fix the monitor. I instructed the patient that at this time an appt to troubleshoot his monitor would be 2+ months out. Instructed pt to call tech support to get help trouble shooting his monitor. Also, cancelled his 04-04-2018 appt w/ [facility] and informed him a scheduler will be in touch to reschedule until after 04-15-2018. Pt verbalized understanding.

## 2018-04-02 LAB — CUP PACEART REMOTE DEVICE CHECK
Battery Remaining Longevity: 15 mo
Battery Remaining Percentage: 12 %
Battery Voltage: 2.72 V
Brady Statistic AP VP Percent: 1 %
Brady Statistic AP VS Percent: 22 %
Brady Statistic AS VP Percent: 1.6 %
Brady Statistic AS VS Percent: 77 %
Brady Statistic RA Percent Paced: 21 %
Brady Statistic RV Percent Paced: 1.6 %
Date Time Interrogation Session: 20200315211258
Implantable Lead Implant Date: 20101111
Implantable Lead Implant Date: 20101111
Implantable Lead Location: 753860
Lead Channel Impedance Value: 340 Ohm
Lead Channel Impedance Value: 380 Ohm
Lead Channel Pacing Threshold Amplitude: 1.5 V
Lead Channel Pacing Threshold Pulse Width: 0.4 ms
Lead Channel Pacing Threshold Pulse Width: 0.4 ms
Lead Channel Sensing Intrinsic Amplitude: 3.1 mV
Lead Channel Sensing Intrinsic Amplitude: 7.3 mV
Lead Channel Setting Pacing Amplitude: 2 V
Lead Channel Setting Pacing Amplitude: 2.5 V
Lead Channel Setting Pacing Pulse Width: 0.4 ms
Lead Channel Setting Sensing Sensitivity: 2 mV
MDC IDC LEAD LOCATION: 753859
MDC IDC MSMT LEADCHNL RA PACING THRESHOLD AMPLITUDE: 1 V
MDC IDC PG IMPLANT DT: 20101111
Pulse Gen Model: 2210
Pulse Gen Serial Number: 7079515

## 2018-04-02 NOTE — Telephone Encounter (Signed)
Called pt regarding episode of CP,shortness of breath and lightheadedness   Had been started on apixoban but SCAF duration was < 1.5 hrs and so, in my estimation based on ASSERT does not meet threshold for anticoagulation I would stop and have advised  Him to do so    [place]

## 2018-04-02 NOTE — Telephone Encounter (Signed)
Pt got his monitor taken care of. Pt states he went to the ER and was in A-fib. Pt was given a call this morning reminding him about his appointment on Thursday 3/19 however it was cancelled. I checking with Dr. [name] nurse to make sure the appointment is cancelled.

## 2018-04-02 NOTE — Telephone Encounter (Signed)
MOD from [place] visit on 03/31/18 reviewed. <1% AT/AF burden since 11/27/17, longest episode 45min duration on 03/31/18. Routed to [employer], Therapist, sports.

## 2018-04-04 ENCOUNTER — Encounter: Payer: Medicare HMO | Admitting: Internal Medicine

## 2018-04-09 ENCOUNTER — Encounter: Payer: Self-pay | Admitting: Cardiology

## 2018-04-09 ENCOUNTER — Telehealth: Payer: Self-pay | Admitting: Internal Medicine

## 2018-04-09 NOTE — Telephone Encounter (Signed)
Pt got a 30 day supply of Toprol 25mg  at the hospital, he requested a 90 day supply from mail order, which I sent to refills. Now he is asking if he should/or still be taking it. Also has a question about the setting on his pacemaker being set lower d/t other health issues and wonders if some meds he is taking is lowering his HR too much. Pls advise

## 2018-04-09 NOTE — Telephone Encounter (Signed)
Dr Klein has never filled this for the patient. Okay to refill? Please advise. Thanks, MI 

## 2018-04-09 NOTE — Progress Notes (Signed)
Remote pacemaker transmission.   

## 2018-04-09 NOTE — Telephone Encounter (Signed)
°*  STAT* If patient is at the pharmacy, call can be transferred to refill team.   1. Which medications need to be refilled? (please list name of each medication and dose if known) Metoprolol Succinate 25mg  1qd  2. Which pharmacy/location (including street and city if local pharmacy) is medication to be sent to? [facility] mail order  3. Do they need a 30 day or 90 day supply? Pt got 30 day supply 03-31-18 from hospital and pt wants to get 90 supply from mail order

## 2018-04-10 MED ORDER — METOPROLOL SUCCINATE ER 25 MG PO TB24: 25.0000 mg | ORAL_TABLET | Freq: Every day | ORAL | 3 refills | Status: DC

## 2018-04-10 NOTE — Addendum Note (Signed)
Addended by: Dollene Primrose on: 04/10/2018 04:01 PM   Modules accepted: Orders

## 2018-04-10 NOTE — Telephone Encounter (Signed)
Spoke with pt who states he is symptomatic with metoprolol. His HR is in the low 60's, he is cold and tired. Per Dr [name], he may take his Metoprolol PRN if he wants to. He agrees with this plan.  He understands if he feels out of rhythm, he may send in a remote transmission for evaluation (given it is during the week) in lieu of reporting to the ED. Depending on his presenting and past rhythm, we may be able to treat him at home. He has verbalized understanding and will call with concerns.

## 2018-04-10 NOTE — Telephone Encounter (Signed)
LVM for return call. 

## 2018-05-14 DIAGNOSIS — G4733 Obstructive sleep apnea (adult) (pediatric): Secondary | ICD-10-CM | POA: Diagnosis not present

## 2018-05-22 ENCOUNTER — Ambulatory Visit (INDEPENDENT_AMBULATORY_CARE_PROVIDER_SITE_OTHER): Payer: Medicare HMO | Admitting: Family Medicine

## 2018-05-22 ENCOUNTER — Other Ambulatory Visit: Payer: Self-pay

## 2018-05-22 ENCOUNTER — Encounter: Payer: Self-pay | Admitting: Family Medicine

## 2018-05-22 DIAGNOSIS — F419 Anxiety disorder, unspecified: Secondary | ICD-10-CM

## 2018-05-22 DIAGNOSIS — E890 Postprocedural hypothyroidism: Secondary | ICD-10-CM | POA: Diagnosis not present

## 2018-05-22 DIAGNOSIS — I10 Essential (primary) hypertension: Secondary | ICD-10-CM | POA: Diagnosis not present

## 2018-05-22 DIAGNOSIS — Z9189 Other specified personal risk factors, not elsewhere classified: Secondary | ICD-10-CM

## 2018-05-22 MED ORDER — LEVOTHYROXINE SODIUM 175 MCG PO TABS: 175.0000 ug | ORAL_TABLET | Freq: Every day | ORAL | 2 refills | Status: DC

## 2018-05-22 MED ORDER — EZETIMIBE 10 MG PO TABS: 10.0000 mg | ORAL_TABLET | Freq: Every day | ORAL | 2 refills | Status: DC

## 2018-05-22 NOTE — Progress Notes (Signed)
Chief Complaint  Patient presents with  . Follow-up    medication    Subjective Arthur Daniel is a 78 y.o. male who presents for hypertension follow up. Due to COVID-19 pandemic, we are interacting via telephone as he does not have tech to do E visit face to face. I verified patient's ID using 2 identifiers. Patient agreed to proceed with visit via this method. Patient is at home, I am at office. Patient and I are present for visit.   He does monitor home blood pressures. Blood pressures ranging from 130's/70's on average. He is not adhering to a healthy diet overall. Current exercise: active around yard/house  Increased 10 yr CVD, does ont tolerated statins. Doing OK with Zetia 10 mg/d. No AE's, compliant. Diet/exercise as above.  Hypothyroidism Patient presents for follow-up of hypothyroidism.  Reports compliance with medication. Current symptoms include: fatigue Denies: denies weight changes, heat/cold intolerance, bowel/skin changes or CVS symptoms (outside of newly dx'd A fib) He believes his dose should be unchanged  Anxiety doing well, uses Xanax. Has over half bottle from rx in Dec. No AE's with this medicine.     Past Medical History:  Diagnosis Date  . 10 year risk of MI or stroke 7.5% or greater 04/28/2016  . Allergic rhinitis, cause unspecified   . Atrial fibrillation ([place])     Associated with hyperthyroidism/Graves' disease  . BPH (benign prostatic hyperplasia) 10/18/2015  . Calculus of kidney   . Diverticulosis   . ED (erectile dysfunction)   . Essential hypertension, benign   . Hematuria, unspecified   . Hernia, umbilical   . History of chicken pox   . Lump or mass in breast   . Mixed hyperlipidemia   . Obesity   . Pacemaker    [place], [place] 2010  . Postablative hypothyroidism   . Sleep apnea    Use CPAP machine  . Ureteral stone    Dr. Risa Grill    Review of Systems Cardiovascular: no chest pain Respiratory:  no shortness of breath  Exam No  conversational dyspnea Age appropriate judgment and insight Nml affect and mood  Essential hypertension  Postablative hypothyroidism - Plan: levothyroxine (SYNTHROID) 175 MCG tablet  Morbid obesity ([facility])  Anxiety  Orders as above. Counseled on diet and exercise. Total time spent: 12 min F/u in 6 mo for CPE or prn. The patient voiced understanding and agreement to the plan.  [place], DO 05/22/18  10:44 AM

## 2018-05-23 ENCOUNTER — Ambulatory Visit: Payer: Medicare HMO | Admitting: Family Medicine

## 2018-05-24 ENCOUNTER — Telehealth: Payer: Self-pay | Admitting: Family Medicine

## 2018-05-24 MED ORDER — EZETIMIBE 10 MG PO TABS: 10.0000 mg | ORAL_TABLET | Freq: Every day | ORAL | 2 refills | Status: AC

## 2018-05-24 NOTE — Telephone Encounter (Signed)
Copied from [place] [phone]. Topic: General - Other >> May 24, 2018 10:02 AM [name] wrote:  Pt cal lto say below RX went to the wrong pharmacy, all his medicines go to [facility]   ezetimibe (ZETIA) 10 MG tablet  [place]

## 2018-05-24 NOTE — Telephone Encounter (Signed)
Rx re-sent to [facility].  

## 2018-06-13 ENCOUNTER — Telehealth: Payer: Self-pay

## 2018-06-13 NOTE — Telephone Encounter (Signed)
I called and spoke with patient, he is ok with switching office visit on 06/19/18 to telehealth visit. This will have to be a phone call, patient does not have a smart phone or internet access.      Virtual Visit Pre-Appointment Phone Call  "(Name), I am calling you today to discuss your upcoming appointment. We are currently trying to limit exposure to the virus that causes COVID-19 by seeing patients at home rather than in the office."  1. "What is the BEST phone number to call the day of the visit?" - include this in appointment notes  2. "Do you have or have access to (through a family member/friend) a smartphone with video capability that we can use for your visit?" a. If yes - list this number in appt notes as "cell" (if different from BEST phone #) and list the appointment type as a VIDEO visit in appointment notes b. If no - list the appointment type as a PHONE visit in appointment notes  3. Confirm consent - "In the setting of the current Covid19 crisis, you are scheduled for a (phone or video) visit with your provider on (date) at (time).  Just as we do with many in-office visits, in order for you to participate in this visit, we must obtain consent.  If you'd like, I can send this to your mychart (if signed up) or email for you to review.  Otherwise, I can obtain your verbal consent now.  All virtual visits are billed to your insurance company just like a normal visit would be.  By agreeing to a virtual visit, we'd like you to understand that the technology does not allow for your provider to perform an examination, and thus may limit your provider's ability to fully assess your condition. If your provider identifies any concerns that need to be evaluated in person, we will make arrangements to do so.  Finally, though the technology is pretty good, we cannot assure that it will always work on either your or our end, and in the setting of a video visit, we may have to convert it to a  phone-only visit.  In either situation, we cannot ensure that we have a secure connection.  Are you willing to proceed?" STAFF: Did the patient verbally acknowledge consent to telehealth visit? Document YES/NO here: YES  4. Advise patient to be prepared - "Two hours prior to your appointment, go ahead and check your blood pressure, pulse, oxygen saturation, and your weight (if you have the equipment to check those) and write them all down. When your visit starts, your provider will ask you for this information. If you have an Apple Watch or Kardia device, please plan to have heart rate information ready on the day of your appointment. Please have a pen and paper handy nearby the day of the visit as well."  5. Give patient instructions for MyChart download to smartphone OR Doximity/Doxy.me as below if video visit (depending on what platform provider is using)  6. Inform patient they will receive a phone call 15 minutes prior to their appointment time (may be from unknown caller ID) so they should be prepared to answer    Arthur Daniel has been deemed a candidate for a follow-up tele-health visit to limit community exposure during the Covid-19 pandemic. I spoke with the patient via phone to ensure availability of phone/video source, confirm preferred email & phone number, and discuss instructions and expectations.  I reminded Arthur  Teresita Daniel to be prepared with any vital sign and/or heart rhythm information that could potentially be obtained via home monitoring, at the time of his visit. I reminded Arthur Daniel to expect a phone call prior to his visit.  Mady Haagensen, [place] 06/13/2018 2:35 PM   INSTRUCTIONS FOR DOWNLOADING THE MYCHART APP TO SMARTPHONE  - The patient must first make sure to have activated MyChart and know their login information - If Apple, go to [employer] and type in MyChart in the search bar and download the app. If Android, ask patient to go to [employer] and type in [place] in the search bar and download the app. The app is free but as with any other app downloads, their phone may require them to verify saved payment information or Apple/Android password.  - The patient will need to then log into the app with their MyChart username and password, and select [facility] as their healthcare provider to link the account. When it is time for your visit, go to the MyChart app, find appointments, and click Begin Video Visit. Be sure to Select Allow for your device to access the Microphone and Camera for your visit. You will then be connected, and your provider will be with you shortly.  **If they have any issues connecting, or need assistance please contact MyChart service desk [phone])**  **If using a computer, in order to ensure the best quality for their visit they will need to use either of the following Internet Browsers: [employer], or Google Chrome**  IF USING DOXIMITY or DOXY.ME - The patient will receive a link just prior to their visit by text.     FULL LENGTH CONSENT FOR TELE-HEALTH VISIT   I hereby voluntarily request, consent and authorize [facility] and its employed or contracted physicians, physician assistants, nurse practitioners or other licensed health care professionals (the Practitioner), to provide me with telemedicine health care services (the "Services") as deemed necessary by the treating Practitioner. I acknowledge and consent to receive the Services by the Practitioner via telemedicine. I understand that the telemedicine visit will involve communicating with the Practitioner through live audiovisual communication technology and the disclosure of certain medical information by electronic transmission. I acknowledge that I have been given the opportunity to request an in-person assessment or other available alternative prior to the telemedicine visit and am voluntarily participating in the  telemedicine visit.  I understand that I have the right to withhold or withdraw my consent to the use of telemedicine in the course of my care at any time, without affecting my right to future care or treatment, and that the Practitioner or I may terminate the telemedicine visit at any time. I understand that I have the right to inspect all information obtained and/or recorded in the course of the telemedicine visit and may receive copies of available information for a reasonable fee.  I understand that some of the potential risks of receiving the Services via telemedicine include:  Marland Kitchen Delay or interruption in medical evaluation due to technological equipment failure or disruption; . Information transmitted may not be sufficient (e.g. poor resolution of images) to allow for appropriate medical decision making by the Practitioner; and/or  . In rare instances, security protocols could fail, causing a breach of personal health information.  Furthermore, I acknowledge that it is my responsibility to provide information about my medical history, conditions and care that is complete and accurate to the best of my ability.  I acknowledge that Practitioner's advice, recommendations, and/or decision may be based on factors not within their control, such as incomplete or inaccurate data provided by me or distortions of diagnostic images or specimens that may result from electronic transmissions. I understand that the practice of medicine is not an exact science and that Practitioner makes no warranties or guarantees regarding treatment outcomes. I acknowledge that I will receive a copy of this consent concurrently upon execution via email to the email address I last provided but may also request a printed copy by calling the office of [facility].    I understand that my insurance will be billed for this visit.   I have read or had this consent read to me. . I understand the contents of this consent, which  adequately explains the benefits and risks of the Services being provided via telemedicine.  . I have been provided ample opportunity to ask questions regarding this consent and the Services and have had my questions answered to my satisfaction. . I give my informed consent for the services to be provided through the use of telemedicine in my medical care  By participating in this telemedicine visit I agree to the above.

## 2018-06-19 ENCOUNTER — Encounter: Payer: Self-pay | Admitting: Internal Medicine

## 2018-06-19 ENCOUNTER — Telehealth (INDEPENDENT_AMBULATORY_CARE_PROVIDER_SITE_OTHER): Payer: Medicare HMO | Admitting: Internal Medicine

## 2018-06-19 ENCOUNTER — Other Ambulatory Visit: Payer: Self-pay

## 2018-06-19 VITALS — BP 126/81 | HR 89 | Ht 74.0 in | Wt 295.0 lb

## 2018-06-19 DIAGNOSIS — I480 Paroxysmal atrial fibrillation: Secondary | ICD-10-CM

## 2018-06-19 DIAGNOSIS — I495 Sick sinus syndrome: Secondary | ICD-10-CM | POA: Diagnosis not present

## 2018-06-19 DIAGNOSIS — Z950 Presence of cardiac pacemaker: Secondary | ICD-10-CM | POA: Diagnosis not present

## 2018-06-19 NOTE — Progress Notes (Signed)
Electrophysiology TeleHealth Note   Due to national recommendations of social distancing due to COVID 19, an audio/video telehealth visit is felt to be most appropriate for this patient at this time.  See MyChart message from today for the patient's consent to telehealth for [facility].   Date:  06/19/2018   ID:  Adden, Strout [birth year], MRN [id number]  Location: patient's home  Provider location: [address], [place]  Evaluation Performed: Follow-up visit  PCP:  Shelda Pal, DO  Cardiologist:     Electrophysiologist:  SK   Chief Complaint:     History of Present Illness:    LYRIK BURESH is a 78 y.o. male who presents via audio/video conferencing for a telehealth visit today. The patient did not have access to video technology/had technical difficulties with video requiring transitioning to audio format only (telephone).  All issues noted in this document were discussed and addressed.  No physical exam could be performed with this format.     Since last being seen in our clinic, the patient reports   Doing very well   Was just out tilling the garden  The patient denies SOB, chest pain edema or palpitations.  There has been no syncope or presyncope.     Hx of symptomatic bradycardia prompting  St Jude Pacemaker; also PAF which occurred in the context of currently quiescent graves disease-- no interval AFib ( some SCAF)    DATE TEST    2010/     Echo   EF 55-60 %   6/15    Myoview   EF 61 % Low risk, small apical ischemia           The patient denies symptoms of fevers, chills, cough, or new SOB worrisome for COVID 19.    Past Medical History:  Diagnosis Date  . 10 year risk of MI or stroke 7.5% or greater 04/28/2016  . Allergic rhinitis, cause unspecified   . Atrial fibrillation ([place])     Associated with hyperthyroidism/Graves' disease  . BPH (benign prostatic hyperplasia) 10/18/2015  . Calculus of kidney   .  Diverticulosis   . ED (erectile dysfunction)   . Essential hypertension, benign   . Hematuria, unspecified   . Hernia, umbilical   . History of chicken pox   . Lump or mass in breast   . Mixed hyperlipidemia   . Obesity   . Pacemaker    [place], [place] 2010  . Postablative hypothyroidism   . Sleep apnea    Use CPAP machine  . Ureteral stone    Dr. Risa Grill    Past Surgical History:  Procedure Laterality Date  . ANTERIOR CRUCIATE LIGAMENT REPAIR Right 1994  . CATARACT EXTRACTION, BILATERAL  08/2010  . COLONOSCOPY  2001, 2008  . FLEXIBLE SIGMOIDOSCOPY  2001  . FOOT SURGERY Left 2005   Fot repair after chainsaw accident  . INSERTION OF MESH N/A 08/31/2016   Procedure: INSERTION OF MESH;  Surgeon: Jackolyn Confer, MD;  Location: [facility];  Service: General;  Laterality: N/A;  . MENISCUS REPAIR Left 2004  . OTHER SURGICAL HISTORY  1995   Ear surgery due to mastoiditis  . PACEMAKER INSERTION  11/26/08   [name]  . SHOULDER ARTHROSCOPY WITH SUBACROMIAL DECOMPRESSION Left 10/20/2013   Procedure: LEFT SHOULDER SCOPE/DISTAL ACROMIOPLASTY, LABRAL DEBRIDMENT;  Surgeon: [name], MD;  Location: [facility];  Service: Orthopedics;  Laterality: Left;  . SHOULDER SURGERY Right 11/2002  .  SHOULDER SURGERY Right 2011  . THYROGLOSSAL DUCT CYST    . TONSILLECTOMY AND ADENOIDECTOMY    . UMBILICAL HERNIA REPAIR N/A 08/31/2016   Procedure: UMBILICAL HERNIA REPAIR WITH MESH;  Surgeon: Jackolyn Confer, MD;  Location: [facility];  Service: General;  Laterality: N/A;  . VASECTOMY      Current Outpatient Medications  Medication Sig Dispense Refill  . alfuzosin (UROXATRAL) 10 MG 24 hr tablet Take 10 mg by mouth at bedtime.    . ALPRAZolam (XANAX) 0.5 MG tablet Take 1 tablet (0.5 mg total) by mouth daily as needed for anxiety. 90 tablet 0  . ezetimibe (ZETIA) 10 MG tablet Take 1 tablet (10 mg total) by mouth daily. 90 tablet 2  . levocetirizine (XYZAL) 5 MG tablet Take 1 tablet (5 mg total)  by mouth every evening. 30 tablet 2  . levothyroxine (SYNTHROID) 175 MCG tablet Take 1 tablet (175 mcg total) by mouth daily before breakfast. 90 tablet 2  . MYRBETRIQ 50 MG TB24 tablet Take 50 mg by mouth daily.     . NON FORMULARY C- PAP MACHINE    . OVER THE COUNTER MEDICATION Take 1 capsule by mouth daily. Amazing Grape Supplement    . silver sulfADIAZINE (SILVADENE) 1 % cream Apply 1 application topically daily. Over burn. 50 g 0   No current facility-administered medications for this visit.     Allergies:   Lipitor [atorvastatin]   Social History:  The patient  reports that he quit smoking about 12 years ago. His smoking use included cigarettes. He has a 55.00 pack-year smoking history. He quit smokeless tobacco use about 4 years ago.  His smokeless tobacco use included chew. He reports current alcohol use. He reports that he does not use drugs.   Family History:  The patient's   family history includes CAD in his brother and mother; CVA in his mother; Cancer in his sister; Cirrhosis in his father; Diabetes in his mother; Goiter in his mother; Heart attack (age of onset: 4) in his son; Hypercholesterolemia in his brother and mother; Hypertension in his brother and mother.   ROS:  Please see the history of present illness.   All other systems are personally reviewed and negative.    Exam:    Vital Signs:  BP 126/81   Pulse 89   Ht 6\' 2"  (1.88 m)   Wt 295 lb (133.8 kg)   BMI 37.88 kg/m         Labs/Other Tests and Data Reviewed:    Recent Labs: 11/21/2017: ALT 20 03/31/2018: B Natriuretic Peptide 42.1; BUN 18; Creatinine, Ser 1.13; Hemoglobin 14.2; Platelets 138; Potassium 3.3; Sodium 140; TSH 4.339   Wt Readings from Last 3 Encounters:  06/19/18 295 lb (133.8 kg)  03/31/18 300 lb (136.1 kg)  01/10/18 (!) 301 lb (136.5 kg)     Other studies personally reviewed: Additional studies/ records that were reviewed today include:As above      Last device remote is reviewed  from [facility] PDF dated 3/20which reveals normal device function,   arrhythmias - AFib and Atach < 5 min   ASSESSMENT & PLAN:    Sinus node dysfunction     Pacemaker-St. Jude   Atrial fibrillation in context of Graves disease  SCAF     Hypertension  Morbidly obese*  No interval palpitations and only SCAF on  Monitoring  BP well controlled   No blood thinner which is appropriate given limited afib and prior afib being 2/2 hyperthyroidism  COVID 19 screen The patient denies symptoms of COVID 19 at this time.  The importance of social distancing was discussed today.  Follow-up:  74m Next remote: As Scheduled   Current medicines are reviewed at length with the patient today.   The patient does not have concerns regarding his medicines.  The following changes were made today:  none  Labs/ tests ordered today include:   No orders of the defined types were placed in this encounter.   Future tests ( post COVID )     Patient Risk:  after full review of this patients clinical status, I feel that they are at moderate risk at this time. 5 Today, I have spent 5 minutes with the patient with telehealth technology discussing the above.  Signed, Virl Axe, MD  06/19/2018 3:20 PM     [place] [address] [place] [address] [phone] (office) [phone] (fax)

## 2018-06-30 LAB — CUP PACEART REMOTE DEVICE CHECK
Battery Remaining Longevity: 7 mo
Battery Remaining Percentage: 6 %
Battery Voltage: 2.68 V
Brady Statistic AP VP Percent: 1 %
Brady Statistic AP VS Percent: 25 %
Brady Statistic AS VP Percent: 1.6 %
Brady Statistic AS VS Percent: 73 %
Brady Statistic RA Percent Paced: 25 %
Brady Statistic RV Percent Paced: 1.7 %
Date Time Interrogation Session: 20200611064227
Implantable Lead Implant Date: 20101111
Implantable Lead Implant Date: 20101111
Implantable Lead Location: 753859
Implantable Lead Location: 753860
Implantable Pulse Generator Implant Date: 20101111
Lead Channel Impedance Value: 330 Ohm
Lead Channel Impedance Value: 400 Ohm
Lead Channel Pacing Threshold Amplitude: 1 V
Lead Channel Pacing Threshold Amplitude: 1.5 V
Lead Channel Pacing Threshold Pulse Width: 0.4 ms
Lead Channel Pacing Threshold Pulse Width: 0.4 ms
Lead Channel Sensing Intrinsic Amplitude: 3.4 mV
Lead Channel Sensing Intrinsic Amplitude: 7.4 mV
Lead Channel Setting Pacing Amplitude: 2 V
Lead Channel Setting Pacing Amplitude: 2.5 V
Lead Channel Setting Pacing Pulse Width: 0.4 ms
Lead Channel Setting Sensing Sensitivity: 2 mV
Pulse Gen Model: 2210
Pulse Gen Serial Number: 7079515

## 2018-07-01 ENCOUNTER — Ambulatory Visit (INDEPENDENT_AMBULATORY_CARE_PROVIDER_SITE_OTHER): Payer: Medicare HMO | Admitting: *Deleted

## 2018-07-01 DIAGNOSIS — I495 Sick sinus syndrome: Secondary | ICD-10-CM

## 2018-07-05 DIAGNOSIS — N3281 Overactive bladder: Secondary | ICD-10-CM | POA: Diagnosis not present

## 2018-07-05 DIAGNOSIS — N401 Enlarged prostate with lower urinary tract symptoms: Secondary | ICD-10-CM | POA: Diagnosis not present

## 2018-07-05 DIAGNOSIS — N5201 Erectile dysfunction due to arterial insufficiency: Secondary | ICD-10-CM | POA: Diagnosis not present

## 2018-07-05 DIAGNOSIS — R351 Nocturia: Secondary | ICD-10-CM | POA: Diagnosis not present

## 2018-07-06 DIAGNOSIS — Z135 Encounter for screening for eye and ear disorders: Secondary | ICD-10-CM | POA: Diagnosis not present

## 2018-07-06 DIAGNOSIS — I10 Essential (primary) hypertension: Secondary | ICD-10-CM | POA: Diagnosis not present

## 2018-07-06 DIAGNOSIS — Z961 Presence of intraocular lens: Secondary | ICD-10-CM | POA: Diagnosis not present

## 2018-07-06 DIAGNOSIS — H43813 Vitreous degeneration, bilateral: Secondary | ICD-10-CM | POA: Diagnosis not present

## 2018-07-06 DIAGNOSIS — H52209 Unspecified astigmatism, unspecified eye: Secondary | ICD-10-CM | POA: Diagnosis not present

## 2018-07-06 DIAGNOSIS — E7800 Pure hypercholesterolemia, unspecified: Secondary | ICD-10-CM | POA: Diagnosis not present

## 2018-07-08 ENCOUNTER — Telehealth: Payer: Self-pay | Admitting: Internal Medicine

## 2018-07-08 DIAGNOSIS — H95191 Other disorders following mastoidectomy, right ear: Secondary | ICD-10-CM | POA: Diagnosis not present

## 2018-07-08 DIAGNOSIS — H90A31 Mixed conductive and sensorineural hearing loss, unilateral, right ear with restricted hearing on the contralateral side: Secondary | ICD-10-CM | POA: Diagnosis not present

## 2018-07-08 DIAGNOSIS — I480 Paroxysmal atrial fibrillation: Secondary | ICD-10-CM

## 2018-07-08 DIAGNOSIS — Z9089 Acquired absence of other organs: Secondary | ICD-10-CM | POA: Diagnosis not present

## 2018-07-08 NOTE — Telephone Encounter (Signed)
LMOVM at home and cell number requesting call back to [place].  Transmission reveals 51 AMS episodes (<1%) since 11/27/17, available EGMs show PAF episodes on 6/14 and 6/21, longest 1hr 83min. Elevated V rates noted per histograms.  Patient returned call. Symptomatic with AF episodes yesterday. Noted some palpitations, chest pain, diaphoresis, ShOB, associated with episodes. He took 0.5mg  PRN Xanax and sat in front of a fan, which resolved his symptoms within 71min. BP this AM at another MD visit was 118/68, but he does not check it regularly at home. Not currently on [place] due to SCAF per notes. Seeking recommendations for future episodes, advised I will route this message to Dr. [name]. Pt is agreeable and denies additional questions at this time.

## 2018-07-08 NOTE — Telephone Encounter (Signed)
Transmission received 07-08-2018 

## 2018-07-08 NOTE — Telephone Encounter (Signed)
New message   Patient is calling to get the results of the pacemaker. The patient states that he has afib. Please call.

## 2018-07-08 NOTE — Telephone Encounter (Signed)
Spoke with pt wife and asked her to have the pt send a manual transmission with his home monitor. That way the nurse can look at the transmission to give him accurate results. The pt been feeling like he been having episodes of A-fib and recently had some A-fib episode last night. The pt would like to be seen at the office. I told her once we receive the transmission I will let the nurse know and she will give him a call to discuss the results of the transmission.

## 2018-07-09 ENCOUNTER — Encounter: Payer: Self-pay | Admitting: Cardiology

## 2018-07-09 NOTE — Progress Notes (Signed)
Remote pacemaker transmission.   

## 2018-07-12 NOTE — Telephone Encounter (Signed)
Good morning  Lets begin him on metoprolol 25 bid (tartrate)  Also is he on anticoagulation ??

## 2018-07-17 ENCOUNTER — Other Ambulatory Visit: Payer: Self-pay | Admitting: Family Medicine

## 2018-07-17 DIAGNOSIS — E890 Postprocedural hypothyroidism: Secondary | ICD-10-CM

## 2018-07-17 NOTE — Telephone Encounter (Signed)
I rmmbr now  Lets keep him off anticoagulation for SCAF And resume his BB if it did not cause him SE  thnks E SK

## 2018-07-18 MED ORDER — METOPROLOL TARTRATE 25 MG PO TABS: 25.0000 mg | ORAL_TABLET | Freq: Two times a day (BID) | ORAL | 11 refills | Status: DC

## 2018-07-18 NOTE — Telephone Encounter (Signed)
Spoke with patient. He is aware that Dr. [name] does not want to re-initiate [place] at this time. He reports he only took Toprol XL once and his legs felt cold, just didn't feel right that day, so he didn't take any more and it was d/c at last OV with Dr. [name]. Pt wishes to try metoprolol tartrate 25mg  BID, sent in 30 day supply to preferred local pharmacy per pt request. He is aware to monitor his BP. Pt will call next week to let us know if he is able to tolerate metoprolol tartrate.  Pt requests sooner f/u with Dr. [name] or EP APP. Will route to [employer], Therapist, sports.

## 2018-07-22 ENCOUNTER — Telehealth: Payer: Self-pay | Admitting: Internal Medicine

## 2018-07-22 DIAGNOSIS — I480 Paroxysmal atrial fibrillation: Secondary | ICD-10-CM

## 2018-07-22 NOTE — Telephone Encounter (Signed)
Patient states he is tolerating the Metoprolol 25mg  twice daily dosage. Dr. [name] put him on the new dosage after his telemedicine visit  06/19/18.   He was told to contact the office a month after his appointment

## 2018-07-23 MED ORDER — METOPROLOL TARTRATE 25 MG PO TABS: 25.0000 mg | ORAL_TABLET | Freq: Two times a day (BID) | ORAL | 3 refills | Status: AC

## 2018-07-23 NOTE — Telephone Encounter (Signed)
Called pt and he is tolerating metoprolol well. He requested refills to be sent to [facility].

## 2018-07-26 NOTE — Progress Notes (Signed)
Virtual Visit via Video Note  I connected with patient on 07/29/18 at  1:00 PM EDT by a video enabled telemedicine application and verified that I am speaking with the correct person using two identifiers.   THIS ENCOUNTER IS A VIRTUAL VISIT DUE TO COVID-19 - PATIENT WAS NOT SEEN IN THE OFFICE. PATIENT HAS CONSENTED TO VIRTUAL VISIT / TELEMEDICINE VISIT   Location of patient: home  Location of provider: office  I discussed the limitations of evaluation and management by telemedicine and the availability of in person appointments. The patient expressed understanding and agreed to proceed.   Subjective:   Arthur Daniel is a 78 y.o. male who presents for Medicare Annual/Subsequent preventive examination.  Review of Systems: No ROS.  Medicare Wellness Virtual Visit.  Visual/audio telehealth visit, UTA vital signs.   See social history for additional risk factors. Cardiac Risk Factors include: advanced age (>47men, >55 women);male gender;obesity (BMI >30kg/m2) Sleep patterns: Wears cpap nightly. No issues. Home Safety/Smoke Alarms: Feels safe in home. Smoke alarms in place.  Lives with wife. No issues navigating w/ stairs.  Male:   CCS-  Pt reports last 01/17/12  PSA-  Lab Results  Component Value Date   PSA 0.16 11/21/2017   PSA 0.25 10/18/2015       Objective:    Vitals: BP 127/78 Comment: pt reported  There is no height or weight on file to calculate BMI.  Advanced Directives 07/29/2018 03/31/2018 07/26/2017 08/24/2016 07/24/2016 06/25/2015 10/16/2013  Does Patient Have a Medical Advance Directive? Yes Yes Yes Yes Yes No Yes  Type of Paramedic of [place];Living will Living will;Healthcare Power of [place];Living will Living will [place];Living will - Living will  Does patient want to make changes to medical advance directive? No - Patient declined - - No - Patient declined - - -  Copy of [place] in Chart? No - copy requested - No - copy requested - No - copy requested - No - copy requested  Would patient like information on creating a medical advance directive? - - - - - No - patient declined information -    Tobacco Social History   Tobacco Use  Smoking Status Former Smoker  . Packs/day: 1.00  . Years: 55.00  . Pack years: 55.00  . Types: Cigarettes  . Quit date: 01/16/2006  . Years since quitting: 12.5  Smokeless Tobacco Former Systems developer  . Types: Chew  . Quit date: 09/16/2013  Tobacco Comment   1/2-1 pack a day     Counseling given: Not Answered Comment: 1/2-1 pack a day   Clinical Intake:     Pain : No/denies pain                 Past Medical History:  Diagnosis Date  . 10 year risk of MI or stroke 7.5% or greater 04/28/2016  . Allergic rhinitis, cause unspecified   . Atrial fibrillation ([place])     Associated with hyperthyroidism/Graves' disease  . BPH (benign prostatic hyperplasia) 10/18/2015  . Calculus of kidney   . Diverticulosis   . ED (erectile dysfunction)   . Essential hypertension, benign   . Hematuria, unspecified   . Hernia, umbilical   . History of chicken pox   . Lump or mass in breast   . Mixed hyperlipidemia   . Obesity   . Pacemaker    [place], [place] 2010  . Postablative hypothyroidism   .  Sleep apnea    Use CPAP machine  . Ureteral stone    Dr. Risa Grill   Past Surgical History:  Procedure Laterality Date  . ANTERIOR CRUCIATE LIGAMENT REPAIR Right 1994  . CATARACT EXTRACTION, BILATERAL  08/2010  . COLONOSCOPY  2001, 2008  . FLEXIBLE SIGMOIDOSCOPY  2001  . FOOT SURGERY Left 2005   Fot repair after chainsaw accident  . INSERTION OF MESH N/A 08/31/2016   Procedure: INSERTION OF MESH;  Surgeon: Jackolyn Confer, MD;  Location: [facility];  Service: General;  Laterality: N/A;  . MENISCUS REPAIR Left 2004  . OTHER SURGICAL HISTORY  1995   Ear surgery due to mastoiditis  . PACEMAKER INSERTION  11/26/08   [name]  . SHOULDER  ARTHROSCOPY WITH SUBACROMIAL DECOMPRESSION Left 10/20/2013   Procedure: LEFT SHOULDER SCOPE/DISTAL ACROMIOPLASTY, LABRAL DEBRIDMENT;  Surgeon: [name], MD;  Location: [facility];  Service: Orthopedics;  Laterality: Left;  . SHOULDER SURGERY Right 11/2002  . SHOULDER SURGERY Right 2011  . THYROGLOSSAL DUCT CYST    . TONSILLECTOMY AND ADENOIDECTOMY    . UMBILICAL HERNIA REPAIR N/A 08/31/2016   Procedure: UMBILICAL HERNIA REPAIR WITH MESH;  Surgeon: Jackolyn Confer, MD;  Location: [facility];  Service: General;  Laterality: N/A;  . VASECTOMY     Family History  Problem Relation Age of Onset  . CVA Mother   . Hypercholesterolemia Mother   . Goiter Mother   . Diabetes Mother        DM  . Hypertension Mother   . CAD Mother   . Cirrhosis Father        due to alcohol  . Cancer Sister        Breast  . Hypercholesterolemia Brother   . Hypertension Brother   . CAD Brother   . Heart attack Son 69   Social History   Socioeconomic History  . Marital status: Married    Spouse name: Not on file  . Number of children: Not on file  . Years of education: Not on file  . Highest education level: Not on file  Occupational History  . Not on file  Social Needs  . Financial resource strain: Not on file  . Food insecurity    Worry: Not on file    Inability: Not on file  . Transportation needs    Medical: Not on file    Non-medical: Not on file  Tobacco Use  . Smoking status: Former Smoker    Packs/day: 1.00    Years: 55.00    Pack years: 55.00    Types: Cigarettes    Quit date: 01/16/2006    Years since quitting: 12.5  . Smokeless tobacco: Former Systems developer    Types: Chew    Quit date: 09/16/2013  . Tobacco comment: 1/2-1 pack a day  Substance and Sexual Activity  . Alcohol use: Yes    Comment: rarely drinks wine  . Drug use: No  . Sexual activity: Yes  Lifestyle  . Physical activity    Days per week: Not on file    Minutes per session: Not on file  . Stress: Not on  file  Relationships  . Social Herbalist on phone: Not on file    Gets together: Not on file    Attends religious service: Not on file    Active member of club or organization: Not on file    Attends meetings of clubs or organizations: Not on file  Relationship status: Not on file  Other Topics Concern  . Not on file  Social History Narrative  . Not on file    Outpatient Encounter Medications as of 07/29/2018  Medication Sig  . alfuzosin (UROXATRAL) 10 MG 24 hr tablet Take 10 mg by mouth at bedtime.  . ALPRAZolam (XANAX) 0.5 MG tablet Take 1 tablet (0.5 mg total) by mouth daily as needed for anxiety.  Marland Kitchen ezetimibe (ZETIA) 10 MG tablet Take 1 tablet (10 mg total) by mouth daily.  Marland Kitchen levocetirizine (XYZAL) 5 MG tablet Take 1 tablet (5 mg total) by mouth every evening.  Marland Kitchen levothyroxine (SYNTHROID) 175 MCG tablet TAKE 1 TABLET (175 MCG TOTAL) BY MOUTH DAILY BEFORE BREAKFAST.  . metoprolol tartrate (LOPRESSOR) 25 MG tablet Take 1 tablet (25 mg total) by mouth 2 (two) times daily.  Marland Kitchen MYRBETRIQ 50 MG TB24 tablet Take 50 mg by mouth daily.   . NON FORMULARY C- PAP MACHINE  . OVER THE COUNTER MEDICATION Take 1 capsule by mouth daily. Amazing Grape Supplement  . silver sulfADIAZINE (SILVADENE) 1 % cream Apply 1 application topically daily. Over burn.   No facility-administered encounter medications on file as of 07/29/2018.     Activities of Daily Living In your present state of health, do you have any difficulty performing the following activities: 07/29/2018  Hearing? N  Vision? N  Difficulty concentrating or making decisions? N  Walking or climbing stairs? N  Dressing or bathing? N  Doing errands, shopping? N  Preparing Food and eating ? N  Using the Toilet? N  In the past six months, have you accidently leaked urine? N  Do you have problems with loss of bowel control? N  Managing your Medications? N  Managing your Finances? N  Housekeeping or managing your Housekeeping?  N  Some recent data might be hidden    Patient Care Team: Shelda Pal, DO as PCP - General (Family Medicine) Hollar, Katharine Look, MD as Referring Physician (Dermatology) McKenzie, Candee Furbish, MD as Consulting Physician (Urology) Frederik Pear, MD as Consulting Physician (Orthopedic Surgery) Charlyn Minerva, [place] (Internal Medicine) Deboraha Sprang, MD as Consulting Physician (Cardiology)   Assessment:   This is a routine wellness examination for [place]. Physical assessment deferred to PCP.  Exercise Activities and Dietary recommendations Current Exercise Habits: The patient does not participate in regular exercise at present, Exercise limited by: None identified Diet (meal preparation, eat out, water intake, caffeinated beverages, dairy products, fruits and vegetables): in general, an "unhealthy" diet, on average, 3 meals per day  Goals    . maintain current active lifestyle.    . patient is going to start walking on treadmill 3x/week for 29min each time (pt-stated)       Fall Risk Fall Risk  07/29/2018 07/26/2017 07/24/2016 09/17/2015  Falls in the past year? 1 No Yes No  Number falls in past yr: 0 - 1 -  Injury with Fall? 1 - No -  Follow up - - Education provided;Falls prevention discussed -    Depression Screen PHQ 2/9 Scores 07/29/2018 07/26/2017 07/24/2016 09/17/2015  PHQ - 2 Score 0 0 0 3  PHQ- 9 Score - - - 6    Cognitive Function Ad8 score reviewed for issues:  Issues making decisions:no   Less interest in hobbies / activities:no  Repeats questions, stories (family complaining):no  Trouble using ordinary gadgets (microwave, computer, phone):no  Forgets the month or year: no  Mismanaging finances: no  Remembering appts:no Daily problems  with thinking and/or memory:no Ad8 score is=0          Immunization History  Administered Date(s) Administered  . Influenza, High Dose Seasonal PF 11/01/2016, 10/30/2017  . Influenza,inj,Quad PF,6+ Mos  10/18/2015  . Pneumococcal Conjugate-13 11/21/2017  . Pneumococcal Polysaccharide-23 04/06/2010  . Tdap 11/21/2017  . Zoster Recombinat (Shingrix) 11/21/2017    Screening Tests Health Maintenance  Topic Date Due  . INFLUENZA VACCINE  08/17/2018  . COLONOSCOPY  01/16/2022  . TETANUS/TDAP  11/22/2027  . PNA vac Low Risk Adult  Completed       Plan:   See you next year!  Continue to eat heart healthy diet (full of fruits, vegetables, whole grains, lean protein, water--limit salt, fat, and sugar intake) and increase physical activity as tolerated.  Continue doing brain stimulating activities (puzzles, reading, adult coloring books, staying active) to keep memory sharp.   Bring a copy of your living will and/or healthcare power of attorney to your next office visit.   I have personally reviewed and noted the following in the patient's chart:   . Medical and social history . Use of alcohol, tobacco or illicit drugs  . Current medications and supplements . Functional ability and status . Nutritional status . Physical activity . Advanced directives . List of other physicians . Hospitalizations, surgeries, and ER visits in previous 12 months . Vitals . Screenings to include cognitive, depression, and falls . Referrals and appointments  In addition, I have reviewed and discussed with patient certain preventive protocols, quality metrics, and best practice recommendations. A written personalized care plan for preventive services as well as general preventive health recommendations were provided to patient.     Shela Nevin, [place]  07/29/2018

## 2018-07-29 ENCOUNTER — Ambulatory Visit (INDEPENDENT_AMBULATORY_CARE_PROVIDER_SITE_OTHER): Payer: Medicare HMO | Admitting: *Deleted

## 2018-07-29 ENCOUNTER — Other Ambulatory Visit: Payer: Self-pay

## 2018-07-29 ENCOUNTER — Encounter: Payer: Self-pay | Admitting: *Deleted

## 2018-07-29 VITALS — BP 127/78

## 2018-07-29 DIAGNOSIS — Z0000 Encounter for general adult medical examination without abnormal findings: Secondary | ICD-10-CM | POA: Diagnosis not present

## 2018-07-29 NOTE — Patient Instructions (Addendum)
See you next year!  Continue to eat heart healthy diet (full of fruits, vegetables, whole grains, lean protein, water--limit salt, fat, and sugar intake) and increase physical activity as tolerated.  Continue doing brain stimulating activities (puzzles, reading, adult coloring books, staying active) to keep memory sharp.   Bring a copy of your living will and/or healthcare power of attorney to your next office visit.   Arthur Daniel , Thank you for taking time to come for your Medicare Wellness Visit. I appreciate your ongoing commitment to your health goals. Please review the following plan we discussed and let me know if I can assist you in the future.   These are the goals we discussed: Goals    . maintain current active lifestyle.    . patient is going to start walking on treadmill 3x/week for 21min each time (pt-stated)       This is a list of the screening recommended for you and due dates:  Health Maintenance  Topic Date Due  . Flu Shot  08/17/2018  . Colon Cancer Screening  01/16/2022  . Tetanus Vaccine  11/22/2027  . Pneumonia vaccines  Completed    Health Maintenance After Age 16 After age 61, you are at a higher risk for certain long-term diseases and infections as well as injuries from falls. Falls are a major cause of broken bones and head injuries in people who are older than age 37. Getting regular preventive care can help to keep you healthy and well. Preventive care includes getting regular testing and making lifestyle changes as recommended by your health care provider. Talk with your health care provider about:  Which screenings and tests you should have. A screening is a test that checks for a disease when you have no symptoms.  A diet and exercise plan that is right for you. What should I know about screenings and tests to prevent falls? Screening and testing are the best ways to find a health problem early. Early diagnosis and treatment give you the best chance of  managing medical conditions that are common after age 45. Certain conditions and lifestyle choices may make you more likely to have a fall. Your health care provider may recommend:  Regular vision checks. Poor vision and conditions such as cataracts can make you more likely to have a fall. If you wear glasses, make sure to get your prescription updated if your vision changes.  Medicine review. Work with your health care provider to regularly review all of the medicines you are taking, including over-the-counter medicines. Ask your health care provider about any side effects that may make you more likely to have a fall. Tell your health care provider if any medicines that you take make you feel dizzy or sleepy.  Osteoporosis screening. Osteoporosis is a condition that causes the bones to get weaker. This can make the bones weak and cause them to break more easily.  Blood pressure screening. Blood pressure changes and medicines to control blood pressure can make you feel dizzy.  Strength and balance checks. Your health care provider may recommend certain tests to check your strength and balance while standing, walking, or changing positions.  Foot health exam. Foot pain and numbness, as well as not wearing proper footwear, can make you more likely to have a fall.  Depression screening. You may be more likely to have a fall if you have a fear of falling, feel emotionally low, or feel unable to do activities that you used to do.  Alcohol use screening. Using too much alcohol can affect your balance and may make you more likely to have a fall. What actions can I take to lower my risk of falls? General instructions  Talk with your health care provider about your risks for falling. Tell your health care provider if: ? You fall. Be sure to tell your health care provider about all falls, even ones that seem minor. ? You feel dizzy, sleepy, or off-balance.  Take over-the-counter and prescription  medicines only as told by your health care provider. These include any supplements.  Eat a healthy diet and maintain a healthy weight. A healthy diet includes low-fat dairy products, low-fat (lean) meats, and fiber from whole grains, beans, and lots of fruits and vegetables. Home safety  Remove any tripping hazards, such as rugs, cords, and clutter.  Install safety equipment such as grab bars in bathrooms and safety rails on stairs.  Keep rooms and walkways well-lit. Activity   Follow a regular exercise program to stay fit. This will help you maintain your balance. Ask your health care provider what types of exercise are appropriate for you.  If you need a cane or walker, use it as recommended by your health care provider.  Wear supportive shoes that have nonskid soles. Lifestyle  Do not drink alcohol if your health care provider tells you not to drink.  If you drink alcohol, limit how much you have: ? 0-1 drink a day for women. ? 0-2 drinks a day for men.  Be aware of how much alcohol is in your drink. In the [place] one drink equals one typical bottle of beer (12 oz), one-half glass of wine (5 oz), or one shot of hard liquor (1 oz).  Do not use any products that contain nicotine or tobacco, such as cigarettes and e-cigarettes. If you need help quitting, ask your health care provider. Summary  Having a healthy lifestyle and getting preventive care can help to protect your health and wellness after age 46.  Screening and testing are the best way to find a health problem early and help you avoid having a fall. Early diagnosis and treatment give you the best chance for managing medical conditions that are more common for people who are older than age 40.  Falls are a major cause of broken bones and head injuries in people who are older than age 80. Take precautions to prevent a fall at home.  Work with your health care provider to learn what changes you can make to improve your  health and wellness and to prevent falls. This information is not intended to replace advice given to you by your health care provider. Make sure you discuss any questions you have with your health care provider. Document Released: 11/15/2016 Document Revised: 04/25/2018 Document Reviewed: 11/15/2016 Elsevier Patient Education  2020 [employer].

## 2018-07-30 DIAGNOSIS — M1711 Unilateral primary osteoarthritis, right knee: Secondary | ICD-10-CM | POA: Diagnosis not present

## 2018-08-13 DIAGNOSIS — G4733 Obstructive sleep apnea (adult) (pediatric): Secondary | ICD-10-CM | POA: Diagnosis not present

## 2018-08-15 DIAGNOSIS — M25561 Pain in right knee: Secondary | ICD-10-CM | POA: Diagnosis not present

## 2018-08-15 DIAGNOSIS — S83241A Other tear of medial meniscus, current injury, right knee, initial encounter: Secondary | ICD-10-CM | POA: Diagnosis not present

## 2018-08-16 ENCOUNTER — Telehealth: Payer: Self-pay | Admitting: *Deleted

## 2018-08-16 NOTE — Telephone Encounter (Signed)
   Primary Cardiologist: No primary care provider on file.  Chart reviewed as part of pre-operative protocol coverage. Patient was contacted 08/16/2018 in reference to pre-operative risk assessment for pending surgery as outlined below.  Arthur Daniel was last seen on 06/19/18 by Dr. [name].  Since that day, Arthur Daniel has done well w/o any cardiac symptoms. No angina.   Therefore, based on ACC/AHA guidelines, the patient would be at acceptable risk for the planned procedure without further cardiovascular testing.   I will route this recommendation to the requesting party via Epic fax function and remove from pre-op pool.  Please call with questions.  Lyda Jester, PA-C 08/16/2018, 2:38 PM

## 2018-08-16 NOTE — Telephone Encounter (Signed)
   [facility] Pre-operative Risk Assessment    Request for surgical clearance:  1. What type of surgery is being performed? RIGHT KNEE ARTHROSCOPY   2. When is this surgery scheduled? TBD   3. What type of clearance is required (medical clearance vs. Pharmacy clearance to hold med vs. Both)? MEDICAL  4. Are there any medications that need to be held prior to surgery and how long? NONE LISTED    5. Practice name and name of physician performing surgery? [facility]; DR. FRANK ROWAN   6. What is your office phone number [phone]    7.   What is your office fax number [phone]  8.   Anesthesia type (None, local, MAC, general) ? NOT LISTED; GENERAL OR SPINAL ?   Julaine Hua 08/16/2018, 2:22 PM  _________________________________________________________________   (provider comments below)

## 2018-08-20 ENCOUNTER — Other Ambulatory Visit: Payer: Self-pay | Admitting: Family Medicine

## 2018-08-20 ENCOUNTER — Encounter: Payer: Self-pay | Admitting: Family Medicine

## 2018-08-20 ENCOUNTER — Other Ambulatory Visit: Payer: Self-pay

## 2018-08-20 ENCOUNTER — Ambulatory Visit (INDEPENDENT_AMBULATORY_CARE_PROVIDER_SITE_OTHER): Payer: Medicare HMO | Admitting: Family Medicine

## 2018-08-20 VITALS — BP 130/68 | HR 69 | Temp 98.4°F | Ht 71.5 in | Wt 298.0 lb

## 2018-08-20 DIAGNOSIS — Z01818 Encounter for other preprocedural examination: Secondary | ICD-10-CM | POA: Diagnosis not present

## 2018-08-20 DIAGNOSIS — I10 Essential (primary) hypertension: Secondary | ICD-10-CM

## 2018-08-20 DIAGNOSIS — R7303 Prediabetes: Secondary | ICD-10-CM | POA: Diagnosis not present

## 2018-08-20 LAB — BASIC METABOLIC PANEL
BUN: 13 mg/dL (ref 6–23)
CO2: 29 mEq/L (ref 19–32)
Calcium: 9.4 mg/dL (ref 8.4–10.5)
Chloride: 102 mEq/L (ref 96–112)
Creatinine, Ser: 1.04 mg/dL (ref 0.40–1.50)
GFR: 69.5 mL/min (ref >60.00)
Glucose, Bld: 100 mg/dL — ABNORMAL HIGH (ref 70–99)
Potassium: 4.5 mEq/L (ref 3.5–5.1)
Sodium: 139 mEq/L (ref 135–145)

## 2018-08-20 LAB — HEMOGLOBIN A1C: Hgb A1c MFr Bld: 6.3 % (ref 4.6–6.5)

## 2018-08-20 LAB — CBC
HCT: 46.5 % (ref 39.0–52.0)
Hemoglobin: 15.6 g/dL (ref 13.0–17.0)
MCHC: 33.5 g/dL (ref 30.0–36.0)
MCV: 94.8 fl (ref 78.0–100.0)
Platelets: 140 10*3/uL — ABNORMAL LOW (ref 150.0–400.0)
RBC: 4.9 Mil/uL (ref 4.22–5.81)
RDW: 14.4 % (ref 11.5–15.5)
WBC: 6.5 10*3/uL (ref 4.0–10.5)

## 2018-08-20 NOTE — Progress Notes (Addendum)
Subjective:   Chief Complaint  Patient presents with  . Pre-op Exam    Arthur Daniel  is here for a Pre-operative physical at the request of Dr. Patton Salles.   He  is having knee surgery on 09/06/2018 for R knee pain.  Personal or family hx of adverse outcome to anesthesia? No  Chipped, cracked, missing, or loose teeth? No teeth on maxillary row Decreased ROM of neck? No  Able to walk up 2 flights of stairs without becoming significantly short of breath or having chest pain? Yes   Revised Goldman Criteria: High Risk Surgery (intraperitoneal, intrathoracic, aortic): No  Ischemic heart disease (Prior MI, +excercise stress test, angina, nitrate use, Qwave): No  History of heart failure: No  History of cerebrovascular disease: No  History of diabetes: No  Insulin therapy for DM: No  Preoperative Cr >2.0: No   Patient Active Problem List   Diagnosis Date Noted  . Anxiety 05/22/2018  . Pulmonary emphysema ([place]) 01/10/2018  . Morbid obesity ([place]) 11/21/2017  . Plantar fasciitis 08/17/2017  . Chronic heel pain, left 08/08/2017  . Neoplasm of uncertain behavior 44/31/5400  . 10 year risk of MI or stroke 7.5% or greater 04/28/2016  . BPH (benign prostatic hyperplasia) 10/18/2015  . Other malaise and fatigue 07/17/2013  . Chest pain, atypical 06/25/2013  . Hypothyroidism 02/19/2009  . Essential hypertension 02/19/2009  . ATRIAL FIBRILLATION 11/30/2008  . Cardiac pacemaker in situ 11/30/2008   Past Medical History:  Diagnosis Date  . 10 year risk of MI or stroke 7.5% or greater 04/28/2016  . Allergic rhinitis, cause unspecified   . Atrial fibrillation ([place])     Associated with hyperthyroidism/Graves' disease  . BPH (benign prostatic hyperplasia) 10/18/2015  . Calculus of kidney   . Diverticulosis   . ED (erectile dysfunction)   . Essential hypertension, benign   . Hematuria, unspecified   . Hernia, umbilical   . History of chicken pox   . Lump or mass in breast   . Mixed hyperlipidemia    . Obesity   . Pacemaker    [place], [place] 2010  . Postablative hypothyroidism   . Sleep apnea    Use CPAP machine  . Ureteral stone    Dr. Risa Grill    Past Surgical History:  Procedure Laterality Date  . ANTERIOR CRUCIATE LIGAMENT REPAIR Right 1994  . CATARACT EXTRACTION, BILATERAL  08/2010  . COLONOSCOPY  2001, 2008  . FLEXIBLE SIGMOIDOSCOPY  2001  . FOOT SURGERY Left 2005   Fot repair after chainsaw accident  . INSERTION OF MESH N/A 08/31/2016   Procedure: INSERTION OF MESH;  Surgeon: Jackolyn Confer, MD;  Location: [facility];  Service: General;  Laterality: N/A;  . MENISCUS REPAIR Left 2004  . OTHER SURGICAL HISTORY  1995   Ear surgery due to mastoiditis  . PACEMAKER INSERTION  11/26/08   [name]  . SHOULDER ARTHROSCOPY WITH SUBACROMIAL DECOMPRESSION Left 10/20/2013   Procedure: LEFT SHOULDER SCOPE/DISTAL ACROMIOPLASTY, LABRAL DEBRIDMENT;  Surgeon: [name], MD;  Location: [facility];  Service: Orthopedics;  Laterality: Left;  . SHOULDER SURGERY Right 11/2002  . SHOULDER SURGERY Right 2011  . THYROGLOSSAL DUCT CYST    . TONSILLECTOMY AND ADENOIDECTOMY    . UMBILICAL HERNIA REPAIR N/A 08/31/2016   Procedure: UMBILICAL HERNIA REPAIR WITH MESH;  Surgeon: Jackolyn Confer, MD;  Location: [facility];  Service: General;  Laterality: N/A;  . VASECTOMY      Current Outpatient Medications  Medication Sig Dispense  Refill  . alfuzosin (UROXATRAL) 10 MG 24 hr tablet Take 10 mg by mouth at bedtime.    . ALPRAZolam (XANAX) 0.5 MG tablet Take 1 tablet (0.5 mg total) by mouth daily as needed for anxiety. 90 tablet 0  . ezetimibe (ZETIA) 10 MG tablet Take 1 tablet (10 mg total) by mouth daily. 90 tablet 2  . levocetirizine (XYZAL) 5 MG tablet Take 1 tablet (5 mg total) by mouth every evening. 30 tablet 2  . levothyroxine (SYNTHROID) 175 MCG tablet TAKE 1 TABLET (175 MCG TOTAL) BY MOUTH DAILY BEFORE BREAKFAST. 90 tablet 2  . metoprolol tartrate (LOPRESSOR) 25 MG tablet Take 1 tablet  (25 mg total) by mouth 2 (two) times daily. 180 tablet 3  . MYRBETRIQ 50 MG TB24 tablet Take 50 mg by mouth daily.     . NON FORMULARY C- PAP MACHINE    . OVER THE COUNTER MEDICATION Take 1 capsule by mouth daily. Amazing Grape Supplement    . silver sulfADIAZINE (SILVADENE) 1 % cream Apply 1 application topically daily. Over burn. 50 g 0   Allergies  Allergen Reactions  . Lipitor [Atorvastatin] Other (See Comments)    Breast lumps    Family History  Problem Relation Age of Onset  . CVA Mother   . Hypercholesterolemia Mother   . Goiter Mother   . Diabetes Mother        DM  . Hypertension Mother   . CAD Mother   . Cirrhosis Father        due to alcohol  . Cancer Sister        Breast  . Hypercholesterolemia Brother   . Hypertension Brother   . CAD Brother   . Heart attack Son 40     Review of Systems:  Constitutional:  no fevers Eye:  no recent significant change in vision Ear:  no hearing loss Nose/Mouth/Throat:  No dental complaints Neck/Thyroid:  no lumps or masses Pulmonary:  No shortness of breath Cardiovascular:  no chest pain Gastrointestinal:  no abdominal pain GU:  negative for dysuria Musculoskeletal/Extremities:  +R knee pain Skin/Integumentary ROS:  no abnormal skin lesions reported Neurologic:  no HA   Objective:   Vitals:   08/20/18 1128  BP: 130/68  Pulse: 69  Temp: 98.4 F (36.9 C)  TempSrc: Oral  SpO2: 93%  Weight: 298 lb (135.2 kg)  Height: 5\' 11"  (1.803 m)   Body mass index is 41.56 kg/m.  General:  well developed, well nourished, in no apparent distress Skin:  warm, no pallor or diaphoresis Head:  normocephalic, atraumatic Eyes:  pupils equal and round, sclera anicteric without injection Ears:  canals without lesions, TMs shiny without retraction, no obvious effusion, no erythema Throat/Pharynx:  lips and gingiva without lesion; tongue and uvula midline; non-inflamed pharynx; no exudates or postnasal drainage; edentulous maxillary  distribution, missing molars and premolars b/l on mandibular side Neck: neck supple without adenopathy, thyromegaly, or masses, no bruits, no jugular venous distention Lungs:  clear to auscultation, breath sounds equal bilaterally, no respiratory distress Cardio:  regular rate and rhythm without murmurs Abdomen:  abdomen soft, nontender; bowel sounds normal; no masses, hepatomegaly or splenomegaly Musculoskeletal:  symmetrical muscle groups noted without atrophy or deformity Extremities:  no clubbing, cyanosis, or edema, no deformities, no skin discoloration Neuro:  gait antalgic; deep tendon reflexes normal and symmetric and alert and oriented to person, place, and time Psych: Age appropriate judgment and insight; normal mood   Assessment:   Pre-op exam -  Plan: EKG 12-Lead  Essential hypertension - Plan: Comprehensive metabolic panel, Hemoglobin E7M, Basic Metabolic Panel (BMET)  Prediabetes - Plan: Comprehensive metabolic panel, Hemoglobin C9O, Basic Metabolic Panel (BMET)   Plan:   Orders as above. EKG - unfortunately system down today. Needs to have it done later this week. Will see how strict their team is with his BMI. 290 lbs would put him below a 40 BMI. May do short course of phentermine, ideally Saxenda if ins will allow.  The above laboratory work was ordered and will be sent with this physical. Pending the above workup, the patient is deemed low cardiac risk for the proposed procedure. 0.4% according to revised Goldmans.   Addendum: EKG machine working, pt able to come back same day. RRR, PR interval prolonged, QRS complex wnl, no signs of T wave changes or ischemia.   The patient voiced understanding and agreement to the plan.  [place], DO 08/20/18  11:46 AM

## 2018-08-20 NOTE — Patient Instructions (Signed)
Give [place] 2-3 business days to get the results of your labs back.   Keep the diet clean and stay active.  If they do not do your surgery due to the BMI, let me know, we may need to trial some medicine for a short period of time.  Let us know if you need anything.

## 2018-08-22 DIAGNOSIS — Z9989 Dependence on other enabling machines and devices: Secondary | ICD-10-CM | POA: Diagnosis not present

## 2018-08-22 DIAGNOSIS — G4733 Obstructive sleep apnea (adult) (pediatric): Secondary | ICD-10-CM | POA: Diagnosis not present

## 2018-08-28 ENCOUNTER — Ambulatory Visit: Payer: Medicare HMO

## 2018-09-06 ENCOUNTER — Other Ambulatory Visit: Payer: Self-pay | Admitting: Orthopedic Surgery

## 2018-09-06 DIAGNOSIS — M25561 Pain in right knee: Secondary | ICD-10-CM

## 2018-09-11 ENCOUNTER — Ambulatory Visit
Admission: RE | Admit: 2018-09-11 | Discharge: 2018-09-11 | Disposition: A | Discharge status: Home / Self Care | Payer: Medicare HMO | Source: Ambulatory Visit | Attending: Orthopedic Surgery | Admitting: Orthopedic Surgery

## 2018-09-11 DIAGNOSIS — M25561 Pain in right knee: Secondary | ICD-10-CM

## 2018-09-11 DIAGNOSIS — M1711 Unilateral primary osteoarthritis, right knee: Secondary | ICD-10-CM | POA: Diagnosis not present

## 2018-09-20 DIAGNOSIS — M2241 Chondromalacia patellae, right knee: Secondary | ICD-10-CM | POA: Diagnosis not present

## 2018-09-20 DIAGNOSIS — M1711 Unilateral primary osteoarthritis, right knee: Secondary | ICD-10-CM | POA: Diagnosis not present

## 2018-09-20 DIAGNOSIS — M11261 Other chondrocalcinosis, right knee: Secondary | ICD-10-CM | POA: Diagnosis not present

## 2018-09-20 DIAGNOSIS — M23361 Other meniscus derangements, other lateral meniscus, right knee: Secondary | ICD-10-CM | POA: Diagnosis not present

## 2018-09-20 DIAGNOSIS — M23351 Other meniscus derangements, posterior horn of lateral meniscus, right knee: Secondary | ICD-10-CM | POA: Diagnosis not present

## 2018-09-20 DIAGNOSIS — S83241A Other tear of medial meniscus, current injury, right knee, initial encounter: Secondary | ICD-10-CM | POA: Diagnosis not present

## 2018-09-20 DIAGNOSIS — M94261 Chondromalacia, right knee: Secondary | ICD-10-CM | POA: Diagnosis not present

## 2018-09-30 ENCOUNTER — Ambulatory Visit (INDEPENDENT_AMBULATORY_CARE_PROVIDER_SITE_OTHER): Payer: Medicare HMO | Admitting: *Deleted

## 2018-09-30 DIAGNOSIS — I495 Sick sinus syndrome: Secondary | ICD-10-CM | POA: Diagnosis not present

## 2018-10-01 LAB — CUP PACEART REMOTE DEVICE CHECK
Battery Remaining Longevity: 4 mo
Battery Remaining Percentage: 3 %
Battery Voltage: 2.65 V
Brady Statistic AP VP Percent: 1 %
Brady Statistic AP VS Percent: 36 %
Brady Statistic AS VP Percent: 1.2 %
Brady Statistic AS VS Percent: 63 %
Brady Statistic RA Percent Paced: 36 %
Brady Statistic RV Percent Paced: 1.2 %
Date Time Interrogation Session: 20200915060528
Implantable Lead Implant Date: 20101111
Implantable Lead Implant Date: 20101111
Implantable Lead Location: 753859
Implantable Lead Location: 753860
Implantable Pulse Generator Implant Date: 20101111
Lead Channel Impedance Value: 340 Ohm
Lead Channel Impedance Value: 400 Ohm
Lead Channel Pacing Threshold Amplitude: 1 V
Lead Channel Pacing Threshold Amplitude: 1.5 V
Lead Channel Pacing Threshold Pulse Width: 0.4 ms
Lead Channel Pacing Threshold Pulse Width: 0.4 ms
Lead Channel Sensing Intrinsic Amplitude: 3.7 mV
Lead Channel Sensing Intrinsic Amplitude: 8.6 mV
Lead Channel Setting Pacing Amplitude: 2 V
Lead Channel Setting Pacing Amplitude: 2.5 V
Lead Channel Setting Pacing Pulse Width: 0.4 ms
Lead Channel Setting Sensing Sensitivity: 2 mV
Pulse Gen Model: 2210
Pulse Gen Serial Number: 7079515

## 2018-10-04 DIAGNOSIS — Z9889 Other specified postprocedural states: Secondary | ICD-10-CM | POA: Diagnosis not present

## 2018-10-11 ENCOUNTER — Encounter: Payer: Self-pay | Admitting: Cardiology

## 2018-10-11 NOTE — Progress Notes (Signed)
Remote pacemaker transmission.   

## 2018-11-13 DIAGNOSIS — G4733 Obstructive sleep apnea (adult) (pediatric): Secondary | ICD-10-CM | POA: Diagnosis not present

## 2018-11-20 ENCOUNTER — Telehealth: Payer: Self-pay

## 2018-11-20 NOTE — Telephone Encounter (Signed)
Copied from [place] [id number]. Topic: General - Other >> Nov 20, 2018 11:42 AM Keene Breath wrote: Reason for CRM: Patient is calling to confirm his appt. On Monday.  He stated that he accidentally pressed option 2 instead of 1 and wanted to make sure that his appt. Would not be cancelled.

## 2018-11-20 NOTE — Telephone Encounter (Signed)
Will check again on tomorrow to see if automatic system cancelled )

## 2018-11-21 ENCOUNTER — Other Ambulatory Visit: Payer: Self-pay

## 2018-11-25 ENCOUNTER — Encounter: Payer: Medicare HMO | Admitting: Family Medicine

## 2018-11-25 ENCOUNTER — Encounter: Payer: Self-pay | Admitting: Family Medicine

## 2018-11-25 ENCOUNTER — Telehealth: Payer: Self-pay | Admitting: Family Medicine

## 2018-11-25 ENCOUNTER — Ambulatory Visit (INDEPENDENT_AMBULATORY_CARE_PROVIDER_SITE_OTHER): Payer: Medicare HMO | Admitting: Family Medicine

## 2018-11-25 ENCOUNTER — Other Ambulatory Visit: Payer: Self-pay

## 2018-11-25 VITALS — BP 120/68 | HR 59 | Temp 97.2°F | Ht 71.5 in | Wt 312.1 lb

## 2018-11-25 DIAGNOSIS — Z0000 Encounter for general adult medical examination without abnormal findings: Secondary | ICD-10-CM | POA: Diagnosis not present

## 2018-11-25 DIAGNOSIS — Z23 Encounter for immunization: Secondary | ICD-10-CM | POA: Diagnosis not present

## 2018-11-25 DIAGNOSIS — Z6841 Body Mass Index (BMI) 40.0 and over, adult: Secondary | ICD-10-CM | POA: Diagnosis not present

## 2018-11-25 LAB — CBC
HCT: 43.6 % (ref 39.0–52.0)
Hemoglobin: 14.5 g/dL (ref 13.0–17.0)
MCHC: 33.3 g/dL (ref 30.0–36.0)
MCV: 95.7 fl (ref 78.0–100.0)
Platelets: 129 10*3/uL — ABNORMAL LOW (ref 150.0–400.0)
RBC: 4.55 Mil/uL (ref 4.22–5.81)
RDW: 13.7 % (ref 11.5–15.5)
WBC: 4.9 10*3/uL (ref 4.0–10.5)

## 2018-11-25 LAB — LIPID PANEL
Cholesterol: 228 mg/dL — ABNORMAL HIGH (ref 0–200)
HDL: 37.5 mg/dL — ABNORMAL LOW (ref >39.00)
NonHDL: 190.05
Total CHOL/HDL Ratio: 6
Triglycerides: 230 mg/dL — ABNORMAL HIGH (ref 0.0–149.0)
VLDL: 46 mg/dL — ABNORMAL HIGH (ref 0.0–40.0)

## 2018-11-25 LAB — COMPREHENSIVE METABOLIC PANEL
ALT: 17 U/L (ref 0–53)
AST: 12 U/L (ref 0–37)
Albumin: 3.9 g/dL (ref 3.5–5.2)
Alkaline Phosphatase: 27 U/L — ABNORMAL LOW (ref 39–117)
BUN: 13 mg/dL (ref 6–23)
CO2: 29 mEq/L (ref 19–32)
Calcium: 8.9 mg/dL (ref 8.4–10.5)
Chloride: 102 mEq/L (ref 96–112)
Creatinine, Ser: 1 mg/dL (ref 0.40–1.50)
GFR: 72.66 mL/min (ref >60.00)
Glucose, Bld: 123 mg/dL — ABNORMAL HIGH (ref 70–99)
Potassium: 4 mEq/L (ref 3.5–5.1)
Sodium: 139 mEq/L (ref 135–145)
Total Bilirubin: 0.5 mg/dL (ref 0.2–1.2)
Total Protein: 5.9 g/dL — ABNORMAL LOW (ref 6.0–8.3)

## 2018-11-25 LAB — HEMOGLOBIN A1C: Hgb A1c MFr Bld: 6.2 % (ref 4.6–6.5)

## 2018-11-25 LAB — T4, FREE: Free T4: 0.91 ng/dL (ref 0.60–1.60)

## 2018-11-25 LAB — TSH: TSH: 9.33 u[IU]/mL — ABNORMAL HIGH (ref 0.35–4.50)

## 2018-11-25 LAB — LDL CHOLESTEROL, DIRECT: Direct LDL: 163 mg/dL

## 2018-11-25 MED ORDER — ALPRAZOLAM 0.5 MG PO TABS: 0.5000 mg | ORAL_TABLET | Freq: Every day | ORAL | 0 refills | Status: AC | PRN

## 2018-11-25 NOTE — Patient Instructions (Addendum)
Give [place] 2-3 business days to get the results of your labs back.   Keep the diet clean and stay active.  Call your pharmacy to get your second shingles vaccine.  Let us know if you need anything.  It might not be a bad idea to check with your ENT regarding your ear issue.   Coping skills Choose 5 that work for you:  Take a deep breath  Count to 20  Read a book  Do a puzzle  Meditate  Bake  [place] outside  Call a friend  Listen to music  Take a walk  Color  Send a note  Take a bath  Watch a movie  Be alone in a quiet place  Pet an animal  Visit a friend  Journal  Exercise  Stretch   Knee Exercises It is normal to feel mild stretching, pulling, tightness, or discomfort as you do these exercises, but you should stop right away if you feel sudden pain or your pain gets worse. STRETCHING AND RANGE OF MOTION EXERCISES  These exercises warm up your muscles and joints and improve the movement and flexibility of your knee. These exercises also help to relieve pain, numbness, and tingling. Exercise A: Knee Extension, Prone  1. Lie on your abdomen on a bed. 2. Place your left / right knee just beyond the edge of the surface so your knee is not on the bed. You can put a towel under your left / right thigh just above your knee for comfort. 3. Relax your leg muscles and allow gravity to straighten your knee. You should feel a stretch behind your left / right knee. 4. Hold this position for 30 seconds. 5. Scoot up so your knee is supported between repetitions. Repeat 2 times. Complete this stretch 3 times per week. Exercise B: Knee Flexion, Active    1. Lie on your back with both knees straight. If this causes back discomfort, bend your left / right knee so your foot is flat on the floor. 2. Slowly slide your left / right heel back toward your buttocks until you feel a gentle stretch in the front of your knee or thigh. 3. Hold this  position for 30 seconds. 4. Slowly slide your left / right heel back to the starting position. Repeat 2 times. Complete this exercise 3 times per week. Exercise C: Quadriceps, Prone    1. Lie on your abdomen on a firm surface, such as a bed or padded floor. 2. Bend your left / right knee and hold your ankle. If you cannot reach your ankle or pant leg, loop a belt around your foot and grab the belt instead. 3. Gently pull your heel toward your buttocks. Your knee should not slide out to the side. You should feel a stretch in the front of your thigh and knee. 4. Hold this position for 30 seconds. Repeat 2 times. Complete this stretch 3 times per week. Exercise D: Hamstring, Supine  1. Lie on your back. 2. Loop a belt or towel over the ball of your left / right foot. The ball of your foot is on the walking surface, right under your toes. 3. Straighten your left / right knee and slowly pull on the belt to raise your leg until you feel a gentle stretch behind your knee. ? Do not let your left / right knee bend while you do this. ? Keep your other leg flat on the  floor. 4. Hold this position for 30 seconds. Repeat 2 times. Complete this stretch 3 times per week. STRENGTHENING EXERCISES  These exercises build strength and endurance in your knee. Endurance is the ability to use your muscles for a long time, even after they get tired. Exercise E: Quadriceps, Isometric    1. Lie on your back with your left / right leg extended and your other knee bent. Put a rolled towel or small pillow under your knee if told by your health care provider. 2. Slowly tense the muscles in the front of your left / right thigh. You should see your kneecap slide up toward your hip or see increased dimpling just above the knee. This motion will push the back of the knee toward the floor. 3. For 3 seconds, keep the muscle as tight as you can without increasing your pain. 4. Relax the muscles slowly and completely. Repeat  for 10 total reps Repeat 2 ti mes. Complete this exercise 3 times per week. Exercise F: Straight Leg Raises - Quadriceps  1. Lie on your back with your left / right leg extended and your other knee bent. 2. Tense the muscles in the front of your left / right thigh. You should see your kneecap slide up or see increased dimpling just above the knee. Your thigh may even shake a bit. 3. Keep these muscles tight as you raise your leg 4-6 inches (10-15 cm) off the floor. Do not let your knee bend. 4. Hold this position for 3 seconds. 5. Keep these muscles tense as you lower your leg. 6. Relax your muscles slowly and completely after each repetition. 10 total reps. Repeat 2 times. Complete this exercise 3 times per week.  Exercise G: Hamstring Curls    If told by your health care provider, do this exercise while wearing ankle weights. Begin with 5 lb weights (optional). Then increase the weight by 1 lb (0.5 kg) increments. Do not wear ankle weights that are more than 20 lbs to start with. 1. Lie on your abdomen with your legs straight. 2. Bend your left / right knee as far as you can without feeling pain. Keep your hips flat against the floor. 3. Hold this position for 3 seconds. 4. Slowly lower your leg to the starting position. Repeat for 10 reps.  Repeat 2 times. Complete this exercise 3 times per week. Exercise H: Squats (Quadriceps)  1. Stand in front of a table, with your feet and knees pointing straight ahead. You may rest your hands on the table for balance but not for support. 2. Slowly bend your knees and lower your hips like you are going to sit in a chair. ? Keep your weight over your heels, not over your toes. ? Keep your lower legs upright so they are parallel with the table legs. ? Do not let your hips go lower than your knees. ? Do not bend lower than told by your health care provider. ? If your knee pain increases, do not bend as low. 3. Hold the squat position for 1  second. 4. Slowly push with your legs to return to standing. Do not use your hands to pull yourself to standing. Repeat 2 times. Complete this exercise 3 times per week. Exercise I: Wall Slides (Quadriceps)    1. Lean your back against a smooth wall or door while you walk your feet out 18-24 inches (46-61 cm) from it. 2. Place your feet hip-width apart. 3. Slowly slide down the  wall or door until your knees Repeat 2 times. Complete this exercise every other day. 4. Exercise K: Straight Leg Raises - Hip Abductors  1. Lie on your side with your left / right leg in the top position. Lie so your head, shoulder, knee, and hip line up. You may bend your bottom knee to help you keep your balance. 2. Roll your hips slightly forward so your hips are stacked directly over each other and your left / right knee is facing forward. 3. Leading with your heel, lift your top leg 4-6 inches (10-15 cm). You should feel the muscles in your outer hip lifting. ? Do not let your foot drift forward. ? Do not let your knee roll toward the ceiling. 4. Hold this position for 3 seconds. 5. Slowly return your leg to the starting position. 6. Let your muscles relax completely after each repetition. 10 total reps. Repeat 2 times. Complete this exercise 3 times per week. Exercise J: Straight Leg Raises - Hip Extensors  1. Lie on your abdomen on a firm surface. You can put a pillow under your hips if that is more comfortable. 2. Tense the muscles in your buttocks and lift your left / right leg about 4-6 inches (10-15 cm). Keep your knee straight as you lift your leg. 3. Hold this position for 3 seconds. 4. Slowly lower your leg to the starting position. 5. Let your leg relax completely after each repetition. Repeat 2 times. Complete this exercise 3 times per week. Document Released: 11/16/2004 Document Revised: 09/27/2015 Document Reviewed: 11/08/2014 Elsevier Interactive Patient Education  2017 [employer].

## 2018-11-25 NOTE — Telephone Encounter (Signed)
Copied from [place] [id number]. Topic: General - Other >> Nov 25, 2018 12:46 PM Keene Breath wrote: Reason for CRM: Patient would like the nurse to call him to tell him when he had his shingles shot last year.  The pharmacy asked the patient and he did not know.  CB# [phone]   Called the patient informed first shingrix was given on 11/21/2017 Patient will let pharmacist know

## 2018-11-25 NOTE — Progress Notes (Signed)
Chief Complaint  Patient presents with  . Annual Exam    Well Male Arthur Daniel is here for a complete physical.   His last physical was >1 year ago.  Current diet: in general, diet has been poor over past 5-6 mo.   Current exercise: none Weight trend: increasing Daytime fatigue? No. Seat belt? Yes.    Health maintenance Shingrix- Needs 2nd vaccine.  Colonoscopy- Yes Tetanus- Yes Prostate cancer screening- Yes Pneumonia vaccine- Yes AAA screening- Yes  Past Medical History:  Diagnosis Date  . 10 year risk of MI or stroke 7.5% or greater 04/28/2016  . Allergic rhinitis, cause unspecified   . BPH (benign prostatic hyperplasia) 10/18/2015  . Diverticulosis   . ED (erectile dysfunction)   . Essential hypertension, benign   . Hernia, umbilical   . Lump or mass in breast   . Mixed hyperlipidemia   . Obesity   . Pacemaker    [place], [place] 2010  . Postablative hypothyroidism   . Sleep apnea    Use CPAP machine  . Ureteral stone    Dr. Risa Grill     Past Surgical History:  Procedure Laterality Date  . ANTERIOR CRUCIATE LIGAMENT REPAIR Right 1994  . CATARACT EXTRACTION, BILATERAL  08/2010  . COLONOSCOPY  2001, 2008  . FLEXIBLE SIGMOIDOSCOPY  2001  . FOOT SURGERY Left 2005   Fot repair after chainsaw accident  . INSERTION OF MESH N/A 08/31/2016   Procedure: INSERTION OF MESH;  Surgeon: Jackolyn Confer, MD;  Location: [facility];  Service: General;  Laterality: N/A;  . MENISCUS REPAIR Left 2004  . OTHER SURGICAL HISTORY  1995   Ear surgery due to mastoiditis  . PACEMAKER INSERTION  11/26/08   [name]  . SHOULDER ARTHROSCOPY WITH SUBACROMIAL DECOMPRESSION Left 10/20/2013   Procedure: LEFT SHOULDER SCOPE/DISTAL ACROMIOPLASTY, LABRAL DEBRIDMENT;  Surgeon: [name], MD;  Location: [facility];  Service: Orthopedics;  Laterality: Left;  . SHOULDER SURGERY Right 11/2002  . SHOULDER SURGERY Right 2011  . THYROGLOSSAL DUCT CYST    . TONSILLECTOMY AND ADENOIDECTOMY     . UMBILICAL HERNIA REPAIR N/A 08/31/2016   Procedure: UMBILICAL HERNIA REPAIR WITH MESH;  Surgeon: Jackolyn Confer, MD;  Location: [facility];  Service: General;  Laterality: N/A;  . VASECTOMY      Medications  Current Outpatient Medications on File Prior to Visit  Medication Sig Dispense Refill  . alfuzosin (UROXATRAL) 10 MG 24 hr tablet Take 10 mg by mouth at bedtime.    . ALPRAZolam (XANAX) 0.5 MG tablet Take 1 tablet (0.5 mg total) by mouth daily as needed for anxiety. 90 tablet 0  . ezetimibe (ZETIA) 10 MG tablet Take 1 tablet (10 mg total) by mouth daily. 90 tablet 2  . levocetirizine (XYZAL) 5 MG tablet Take 1 tablet (5 mg total) by mouth every evening. 30 tablet 2  . levothyroxine (SYNTHROID) 175 MCG tablet TAKE 1 TABLET (175 MCG TOTAL) BY MOUTH DAILY BEFORE BREAKFAST. 90 tablet 2  . metoprolol tartrate (LOPRESSOR) 25 MG tablet Take 1 tablet (25 mg total) by mouth 2 (two) times daily. 180 tablet 3  . MYRBETRIQ 50 MG TB24 tablet Take 50 mg by mouth daily.     . NON FORMULARY C- PAP MACHINE    . OVER THE COUNTER MEDICATION Take 1 capsule by mouth daily. Amazing Grape Supplement    . silver sulfADIAZINE (SILVADENE) 1 % cream Apply 1 application topically daily. Over burn. 50 g 0  Allergies Allergies  Allergen Reactions  . Lipitor [Atorvastatin] Other (See Comments)    Breast lumps    Family History Family History  Problem Relation Age of Onset  . CVA Mother   . Hypercholesterolemia Mother   . Goiter Mother   . Diabetes Mother        DM  . Hypertension Mother   . CAD Mother   . Cirrhosis Father        due to alcohol  . Cancer Sister        Breast  . Hypercholesterolemia Brother   . Hypertension Brother   . CAD Brother   . Heart attack Son 40    Review of Systems: Constitutional:  no fevers or chills Eye:  no recent significant change in vision Ear/Nose/Mouth/Throat:  Ears: R sided hearing getting worse Nose/Mouth/Throat:  no complaints of nasal congestion or sore  throat Cardiovascular:  no chest pain Respiratory:  no shortness of breath Gastrointestinal:  no abdominal pain, no change in bowel habits GU:  Male: negative for dysuria, frequency, and incontinence  Musculoskeletal/Extremities: +chronic R knee pain; no pain of the joints Integumentary (Skin):  no abnormal skin lesions reported Neurologic:  no headaches, Endocrine:  No unexpected weight changes Hematologic/Lymphatic:  no areas of easy bruising  Exam BP 120/68 (BP Location: Left Arm, Patient Position: Sitting, Cuff Size: Large)   Pulse (!) 59   Temp (!) 97.2 F (36.2 C) (Temporal)   Ht 5' 11.5" (1.816 m)   Wt (!) 312 lb 2 oz (141.6 kg)   SpO2 94%   BMI 42.93 kg/m  General:  well developed, well nourished, in no apparent distress Skin:  no significant moles, warts, or growths Head:  no masses, lesions, or tenderness Eyes:  pupils equal and round, sclera anicteric without injection Ears:  canals without lesions, TMs shiny without retraction, no obvious effusion, no erythema Nose:  nares patent, septum midline, mucosa normal Throat/Pharynx:  lips and gingiva without lesion; tongue and uvula midline; non-inflamed pharynx; no exudates or postnasal drainage Neck: neck supple without adenopathy, thyromegaly, or masses Lungs:  clear to auscultation, breath sounds equal bilaterally, no respiratory distress Cardio:  regular rate and rhythm, no LE edema or bruits Rectal: Deferred Musculoskeletal:  symmetrical muscle groups noted without atrophy or deformity Extremities:  no clubbing, cyanosis, or edema, no deformities, no skin discoloration Neuro:  gait normal; deep tendon reflexes normal and symmetric Psych: well oriented with normal range of affect and appropriate judgment/insight  Assessment and Plan  Well adult exam - Plan: CBC, Comp Met (CMET), Lipid Profile, HgB A1c  Need for influenza vaccination - Plan: Flu Vaccine QUAD High Dose(Fluad)  Morbid obesity ([facility]) - Plan: Amb Ref to  Medical Weight Management   Well 78 y.o. male. Counseled on diet and exercise. Other orders as above. Follow up in 6 mo or prn.  The patient voiced understanding and agreement to the plan.  [place], DO 11/25/18 8:09 AM

## 2018-12-24 ENCOUNTER — Other Ambulatory Visit: Payer: Self-pay

## 2018-12-24 ENCOUNTER — Encounter (HOSPITAL_BASED_OUTPATIENT_CLINIC_OR_DEPARTMENT_OTHER): Payer: Self-pay

## 2018-12-24 ENCOUNTER — Inpatient Hospital Stay (HOSPITAL_BASED_OUTPATIENT_CLINIC_OR_DEPARTMENT_OTHER)
Admission: EM | Admit: 2018-12-24 | Discharge: 2019-01-17 | DRG: 207 | Disposition: E | Payer: Medicare HMO | Attending: Pulmonary Disease | Admitting: Pulmonary Disease

## 2018-12-24 ENCOUNTER — Emergency Department (HOSPITAL_BASED_OUTPATIENT_CLINIC_OR_DEPARTMENT_OTHER): Payer: Medicare HMO

## 2018-12-24 DIAGNOSIS — J439 Emphysema, unspecified: Secondary | ICD-10-CM | POA: Diagnosis not present

## 2018-12-24 DIAGNOSIS — E871 Hypo-osmolality and hyponatremia: Secondary | ICD-10-CM | POA: Diagnosis present

## 2018-12-24 DIAGNOSIS — I9589 Other hypotension: Secondary | ICD-10-CM | POA: Diagnosis not present

## 2018-12-24 DIAGNOSIS — E039 Hypothyroidism, unspecified: Secondary | ICD-10-CM | POA: Diagnosis not present

## 2018-12-24 DIAGNOSIS — Z833 Family history of diabetes mellitus: Secondary | ICD-10-CM | POA: Diagnosis not present

## 2018-12-24 DIAGNOSIS — Z4682 Encounter for fitting and adjustment of non-vascular catheter: Secondary | ICD-10-CM | POA: Diagnosis not present

## 2018-12-24 DIAGNOSIS — R7303 Prediabetes: Secondary | ICD-10-CM | POA: Diagnosis not present

## 2018-12-24 DIAGNOSIS — E872 Acidosis: Secondary | ICD-10-CM | POA: Diagnosis not present

## 2018-12-24 DIAGNOSIS — Z9911 Dependence on respirator [ventilator] status: Secondary | ICD-10-CM | POA: Diagnosis not present

## 2018-12-24 DIAGNOSIS — E785 Hyperlipidemia, unspecified: Secondary | ICD-10-CM | POA: Diagnosis present

## 2018-12-24 DIAGNOSIS — K5790 Diverticulosis of intestine, part unspecified, without perforation or abscess without bleeding: Secondary | ICD-10-CM | POA: Diagnosis present

## 2018-12-24 DIAGNOSIS — R350 Frequency of micturition: Secondary | ICD-10-CM | POA: Diagnosis present

## 2018-12-24 DIAGNOSIS — J982 Interstitial emphysema: Secondary | ICD-10-CM | POA: Diagnosis not present

## 2018-12-24 DIAGNOSIS — R197 Diarrhea, unspecified: Secondary | ICD-10-CM | POA: Diagnosis not present

## 2018-12-24 DIAGNOSIS — D751 Secondary polycythemia: Secondary | ICD-10-CM | POA: Diagnosis not present

## 2018-12-24 DIAGNOSIS — R0602 Shortness of breath: Secondary | ICD-10-CM

## 2018-12-24 DIAGNOSIS — R0789 Other chest pain: Secondary | ICD-10-CM | POA: Diagnosis not present

## 2018-12-24 DIAGNOSIS — N179 Acute kidney failure, unspecified: Secondary | ICD-10-CM | POA: Diagnosis not present

## 2018-12-24 DIAGNOSIS — J181 Lobar pneumonia, unspecified organism: Secondary | ICD-10-CM | POA: Diagnosis not present

## 2018-12-24 DIAGNOSIS — R509 Fever, unspecified: Secondary | ICD-10-CM | POA: Diagnosis not present

## 2018-12-24 DIAGNOSIS — I4891 Unspecified atrial fibrillation: Secondary | ICD-10-CM | POA: Diagnosis not present

## 2018-12-24 DIAGNOSIS — Z950 Presence of cardiac pacemaker: Secondary | ICD-10-CM | POA: Diagnosis not present

## 2018-12-24 DIAGNOSIS — J939 Pneumothorax, unspecified: Secondary | ICD-10-CM | POA: Diagnosis not present

## 2018-12-24 DIAGNOSIS — J95811 Postprocedural pneumothorax: Secondary | ICD-10-CM | POA: Diagnosis not present

## 2018-12-24 DIAGNOSIS — Z6841 Body Mass Index (BMI) 40.0 and over, adult: Secondary | ICD-10-CM

## 2018-12-24 DIAGNOSIS — I10 Essential (primary) hypertension: Secondary | ICD-10-CM | POA: Diagnosis present

## 2018-12-24 DIAGNOSIS — T797XXA Traumatic subcutaneous emphysema, initial encounter: Secondary | ICD-10-CM

## 2018-12-24 DIAGNOSIS — R0902 Hypoxemia: Secondary | ICD-10-CM

## 2018-12-24 DIAGNOSIS — I469 Cardiac arrest, cause unspecified: Secondary | ICD-10-CM | POA: Diagnosis not present

## 2018-12-24 DIAGNOSIS — N401 Enlarged prostate with lower urinary tract symptoms: Secondary | ICD-10-CM | POA: Diagnosis present

## 2018-12-24 DIAGNOSIS — D696 Thrombocytopenia, unspecified: Secondary | ICD-10-CM | POA: Diagnosis present

## 2018-12-24 DIAGNOSIS — J9601 Acute respiratory failure with hypoxia: Secondary | ICD-10-CM | POA: Diagnosis present

## 2018-12-24 DIAGNOSIS — N400 Enlarged prostate without lower urinary tract symptoms: Secondary | ICD-10-CM | POA: Diagnosis present

## 2018-12-24 DIAGNOSIS — I959 Hypotension, unspecified: Secondary | ICD-10-CM | POA: Diagnosis not present

## 2018-12-24 DIAGNOSIS — J80 Acute respiratory distress syndrome: Secondary | ICD-10-CM | POA: Diagnosis not present

## 2018-12-24 DIAGNOSIS — R918 Other nonspecific abnormal finding of lung field: Secondary | ICD-10-CM | POA: Diagnosis not present

## 2018-12-24 DIAGNOSIS — Z515 Encounter for palliative care: Secondary | ICD-10-CM | POA: Diagnosis not present

## 2018-12-24 DIAGNOSIS — J432 Centrilobular emphysema: Secondary | ICD-10-CM | POA: Diagnosis present

## 2018-12-24 DIAGNOSIS — E038 Other specified hypothyroidism: Secondary | ICD-10-CM | POA: Diagnosis not present

## 2018-12-24 DIAGNOSIS — R0600 Dyspnea, unspecified: Secondary | ICD-10-CM | POA: Diagnosis not present

## 2018-12-24 DIAGNOSIS — A0839 Other viral enteritis: Secondary | ICD-10-CM | POA: Diagnosis present

## 2018-12-24 DIAGNOSIS — E876 Hypokalemia: Secondary | ICD-10-CM | POA: Diagnosis not present

## 2018-12-24 DIAGNOSIS — R739 Hyperglycemia, unspecified: Secondary | ICD-10-CM | POA: Diagnosis not present

## 2018-12-24 DIAGNOSIS — Z66 Do not resuscitate: Secondary | ICD-10-CM | POA: Diagnosis not present

## 2018-12-24 DIAGNOSIS — F419 Anxiety disorder, unspecified: Secondary | ICD-10-CM | POA: Diagnosis not present

## 2018-12-24 DIAGNOSIS — G4733 Obstructive sleep apnea (adult) (pediatric): Secondary | ICD-10-CM | POA: Diagnosis present

## 2018-12-24 DIAGNOSIS — J1289 Other viral pneumonia: Secondary | ICD-10-CM | POA: Diagnosis present

## 2018-12-24 DIAGNOSIS — Z823 Family history of stroke: Secondary | ICD-10-CM | POA: Diagnosis not present

## 2018-12-24 DIAGNOSIS — Z8349 Family history of other endocrine, nutritional and metabolic diseases: Secondary | ICD-10-CM

## 2018-12-24 DIAGNOSIS — U071 COVID-19: Secondary | ICD-10-CM | POA: Diagnosis not present

## 2018-12-24 DIAGNOSIS — E890 Postprocedural hypothyroidism: Secondary | ICD-10-CM | POA: Diagnosis present

## 2018-12-24 DIAGNOSIS — Z79890 Hormone replacement therapy (postmenopausal): Secondary | ICD-10-CM | POA: Diagnosis not present

## 2018-12-24 DIAGNOSIS — R578 Other shock: Secondary | ICD-10-CM | POA: Diagnosis not present

## 2018-12-24 DIAGNOSIS — Z938 Other artificial opening status: Secondary | ICD-10-CM

## 2018-12-24 DIAGNOSIS — J189 Pneumonia, unspecified organism: Secondary | ICD-10-CM | POA: Diagnosis not present

## 2018-12-24 DIAGNOSIS — J438 Other emphysema: Secondary | ICD-10-CM | POA: Diagnosis not present

## 2018-12-24 DIAGNOSIS — Z888 Allergy status to other drugs, medicaments and biological substances status: Secondary | ICD-10-CM

## 2018-12-24 DIAGNOSIS — Z803 Family history of malignant neoplasm of breast: Secondary | ICD-10-CM

## 2018-12-24 DIAGNOSIS — Z87891 Personal history of nicotine dependence: Secondary | ICD-10-CM

## 2018-12-24 DIAGNOSIS — T380X5A Adverse effect of glucocorticoids and synthetic analogues, initial encounter: Secondary | ICD-10-CM | POA: Diagnosis not present

## 2018-12-24 LAB — PROCALCITONIN: Procalcitonin: <0.1 ng/mL

## 2018-12-24 LAB — LIPASE, BLOOD: Lipase: 31 U/L (ref 11–51)

## 2018-12-24 LAB — LACTIC ACID, PLASMA: Lactic Acid, Venous: 1.2 mmol/L (ref 0.5–1.9)

## 2018-12-24 LAB — CBC WITH DIFFERENTIAL/PLATELET
Abs Immature Granulocytes: 0.01 10*3/uL (ref 0.00–0.07)
Basophils Absolute: 0 10*3/uL (ref 0.0–0.1)
Basophils Relative: 0 %
Eosinophils Absolute: 0 10*3/uL (ref 0.0–0.5)
Eosinophils Relative: 0 %
HCT: 45.7 % (ref 39.0–52.0)
Hemoglobin: 15.2 g/dL (ref 13.0–17.0)
Immature Granulocytes: 0 %
Lymphocytes Relative: 13 %
Lymphs Abs: 0.6 10*3/uL — ABNORMAL LOW (ref 0.7–4.0)
MCH: 31.7 pg (ref 26.0–34.0)
MCHC: 33.3 g/dL (ref 30.0–36.0)
MCV: 95.2 fL (ref 80.0–100.0)
Monocytes Absolute: 0.4 10*3/uL (ref 0.1–1.0)
Monocytes Relative: 9 %
Neutro Abs: 3.4 10*3/uL (ref 1.7–7.7)
Neutrophils Relative %: 78 %
Platelets: 99 10*3/uL — ABNORMAL LOW (ref 150–400)
RBC: 4.8 MIL/uL (ref 4.22–5.81)
RDW: 13 % (ref 11.5–15.5)
Smear Review: DECREASED
WBC: 4.4 10*3/uL (ref 4.0–10.5)
nRBC: 0 % (ref 0.0–0.2)

## 2018-12-24 LAB — COMPREHENSIVE METABOLIC PANEL
ALT: 59 U/L — ABNORMAL HIGH (ref 0–44)
AST: 67 U/L — ABNORMAL HIGH (ref 15–41)
Albumin: 3.6 g/dL (ref 3.5–5.0)
Alkaline Phosphatase: 31 U/L — ABNORMAL LOW (ref 38–126)
Anion gap: 9 (ref 5–15)
BUN: 14 mg/dL (ref 8–23)
CO2: 23 mmol/L (ref 22–32)
Calcium: 8.4 mg/dL — ABNORMAL LOW (ref 8.9–10.3)
Chloride: 98 mmol/L (ref 98–111)
Creatinine, Ser: 1.13 mg/dL (ref 0.61–1.24)
GFR calc Af Amer: >60 mL/min (ref >60)
GFR calc non Af Amer: >60 mL/min (ref >60)
Glucose, Bld: 158 mg/dL — ABNORMAL HIGH (ref 70–99)
Potassium: 3.9 mmol/L (ref 3.5–5.1)
Sodium: 130 mmol/L — ABNORMAL LOW (ref 135–145)
Total Bilirubin: 0.6 mg/dL (ref 0.3–1.2)
Total Protein: 6.6 g/dL (ref 6.5–8.1)

## 2018-12-24 LAB — URINALYSIS, ROUTINE W REFLEX MICROSCOPIC
Bilirubin Urine: NEGATIVE
Glucose, UA: NEGATIVE mg/dL
Hgb urine dipstick: NEGATIVE
Ketones, ur: NEGATIVE mg/dL
Leukocytes,Ua: NEGATIVE
Nitrite: NEGATIVE
Protein, ur: 30 mg/dL — AB
Specific Gravity, Urine: 1.015 (ref 1.005–1.030)
pH: 6 (ref 5.0–8.0)

## 2018-12-24 LAB — D-DIMER, QUANTITATIVE: D-Dimer, Quant: 0.55 ug/mL-FEU — ABNORMAL HIGH (ref 0.00–0.50)

## 2018-12-24 LAB — C-REACTIVE PROTEIN: CRP: 3.9 mg/dL — ABNORMAL HIGH (ref <1.0)

## 2018-12-24 LAB — TROPONIN I (HIGH SENSITIVITY): Troponin I (High Sensitivity): 9 ng/L (ref <18)

## 2018-12-24 LAB — BRAIN NATRIURETIC PEPTIDE: B Natriuretic Peptide: 39.6 pg/mL (ref 0.0–100.0)

## 2018-12-24 LAB — URINALYSIS, MICROSCOPIC (REFLEX)

## 2018-12-24 LAB — FIBRINOGEN: Fibrinogen: 522 mg/dL — ABNORMAL HIGH (ref 210–475)

## 2018-12-24 LAB — SARS CORONAVIRUS 2 AG (30 MIN TAT): SARS Coronavirus 2 Ag: POSITIVE — AB

## 2018-12-24 LAB — HEPATITIS B SURFACE ANTIGEN: Hepatitis B Surface Ag: NONREACTIVE

## 2018-12-24 LAB — LACTATE DEHYDROGENASE: LDH: 277 U/L — ABNORMAL HIGH (ref 98–192)

## 2018-12-24 MED ORDER — SODIUM CHLORIDE 0.9 % IV SOLN
100.0000 mg | Freq: Every day | INTRAVENOUS | Status: AC
  Administered 2018-12-25 – 2018-12-28 (×4): 100 mg via INTRAVENOUS
  Filled 2018-12-24 (×4): qty 20

## 2018-12-24 MED ORDER — ACETAMINOPHEN 325 MG PO TABS
650.0000 mg | ORAL_TABLET | Freq: Four times a day (QID) | ORAL | Status: DC | PRN
  Administered 2018-12-28: 650 mg via ORAL
  Filled 2018-12-24: qty 2

## 2018-12-24 MED ORDER — SODIUM CHLORIDE 0.9 % IV SOLN
200.0000 mg | Freq: Once | INTRAVENOUS | Status: AC
  Administered 2018-12-24: 200 mg via INTRAVENOUS
  Filled 2018-12-24: qty 40

## 2018-12-24 MED ORDER — TOCILIZUMAB 400 MG/20ML IV SOLN
800.0000 mg | Freq: Once | INTRAVENOUS | Status: AC
  Administered 2018-12-25: 800 mg via INTRAVENOUS
  Filled 2018-12-24: qty 40

## 2018-12-24 MED ORDER — GUAIFENESIN-DM 100-10 MG/5ML PO SYRP
10.0000 mL | ORAL_SOLUTION | ORAL | Status: DC | PRN
  Filled 2018-12-24 (×2): qty 10

## 2018-12-24 MED ORDER — ENOXAPARIN SODIUM 80 MG/0.8ML ~~LOC~~ SOLN
70.0000 mg | SUBCUTANEOUS | Status: DC
  Administered 2018-12-25 – 2018-12-30 (×7): 70 mg via SUBCUTANEOUS
  Filled 2018-12-24 (×7): qty 0.8

## 2018-12-24 MED ORDER — ACETAMINOPHEN 325 MG PO TABS
650.0000 mg | ORAL_TABLET | Freq: Once | ORAL | Status: AC
  Administered 2018-12-24: 650 mg via ORAL
  Filled 2018-12-24: qty 2

## 2018-12-24 MED ORDER — HYDROCOD POLST-CPM POLST ER 10-8 MG/5ML PO SUER
5.0000 mL | Freq: Two times a day (BID) | ORAL | Status: DC | PRN
  Administered 2018-12-25 – 2019-01-02 (×3): 5 mL via ORAL
  Filled 2018-12-24 (×3): qty 5

## 2018-12-24 MED ORDER — ZINC SULFATE 220 (50 ZN) MG PO CAPS
220.0000 mg | ORAL_CAPSULE | Freq: Every day | ORAL | Status: DC
  Administered 2018-12-25 – 2019-01-09 (×16): 220 mg via ORAL
  Filled 2018-12-24 (×15): qty 1

## 2018-12-24 MED ORDER — SODIUM CHLORIDE 0.9 % IV SOLN
200.0000 mg | Freq: Once | INTRAVENOUS | Status: DC
  Filled 2018-12-24: qty 40

## 2018-12-24 MED ORDER — TOCILIZUMAB 400 MG/20ML IV SOLN
800.0000 mg | Freq: Once | INTRAVENOUS | Status: DC
  Filled 2018-12-24: qty 40

## 2018-12-24 MED ORDER — DEXAMETHASONE SODIUM PHOSPHATE 10 MG/ML IJ SOLN
6.0000 mg | INTRAMUSCULAR | Status: DC
  Administered 2018-12-24 – 2018-12-29 (×6): 6 mg via INTRAVENOUS
  Filled 2018-12-24 (×6): qty 1

## 2018-12-24 MED ORDER — ASCORBIC ACID 500 MG PO TABS
500.0000 mg | ORAL_TABLET | Freq: Every day | ORAL | Status: DC
  Administered 2018-12-25 – 2019-01-09 (×16): 500 mg via ORAL
  Filled 2018-12-24 (×16): qty 1

## 2018-12-24 MED ORDER — ENOXAPARIN SODIUM 40 MG/0.4ML ~~LOC~~ SOLN: 40.0000 mg | SUBCUTANEOUS | Status: DC

## 2018-12-24 MED ORDER — SODIUM CHLORIDE 0.9 % IV SOLN
100.0000 mg | Freq: Every day | INTRAVENOUS | Status: DC
  Filled 2018-12-24: qty 20

## 2018-12-24 NOTE — ED Provider Notes (Signed)
[facility] Provider Note   CSN: [id number] Arrival date & time: 12/29/2018  1422     History   Chief Complaint Chief Complaint  Patient presents with  . Diarrhea  . Urinary Frequency    HPI Arthur [facility] is a 78 y.o. male.     78 y.o male with a PMH of PE,Pacemaker, Afib presents to the ED with a chief complaint of abdominal x 3 weeks.  Reports he has been feeling some generalized abdominal pain for the past 3 weeks, he had had several episodes of diarrhea, no blood in his stool.  He also reports has been having some urinary frequency, states he voids every hour, states that he does not feel like he empties his bladder.  He also endorses shortness of breath, this does not have any exacerbating or alleviating factors, is noticeable at rest.  He also reports pain in the center of his chest, this does not radiate anywhere.  He does have a current pacemaker in place.  He denies any sick exposures, reports he has not been around anybody with COVID-19, he has been at home isolated for the past 3 weeks.  He is febrile on arrival with a temp of 100.7, denies any cough, swelling to his legs, prior history of heart failure.  The history is provided by the patient and medical records.  Diarrhea Associated symptoms: abdominal pain, chills and fever   Associated symptoms: no headaches and no vomiting   Urinary Frequency Associated symptoms include chest pain, abdominal pain and shortness of breath. Pertinent negatives include no headaches.    Past Medical History:  Diagnosis Date  . 10 year risk of MI or stroke 7.5% or greater 04/28/2016  . Allergic rhinitis, cause unspecified   . BPH (benign prostatic hyperplasia) 10/18/2015  . Diverticulosis   . ED (erectile dysfunction)   . Essential hypertension, benign   . Hernia, umbilical   . Lump or mass in breast   . Mixed hyperlipidemia   . Obesity   . Pacemaker    [facility], [name] 2010  . Postablative  hypothyroidism   . Sleep apnea    Use CPAP machine  . Ureteral stone    Dr. Risa Grill    Patient Active Problem List   Diagnosis Date Noted  . Dyslipidemia 12/25/2018  . Pneumonia due to COVID-19 virus 12/25/2018  . Gastroenteritis due to COVID-19 virus 12/25/2018  . Thrombocytopenia ([place]) 12/25/2018  . Acute respiratory failure with hypoxia ([place]) 12/21/2018  . COVID-19 01/14/2019  . Anxiety 05/22/2018  . Pulmonary emphysema ([place]) 01/10/2018  . Morbid obesity ([place]) 11/21/2017  . Plantar fasciitis 08/17/2017  . Chronic heel pain, left 08/08/2017  . Neoplasm of uncertain behavior 0000000  . 10 year risk of MI or stroke 7.5% or greater 04/28/2016  . BPH (benign prostatic hyperplasia) 10/18/2015  . Other malaise and fatigue 07/17/2013  . Chest pain, atypical 06/25/2013  . Hypothyroidism 02/19/2009  . Essential hypertension 02/19/2009  . ATRIAL FIBRILLATION 11/30/2008  . Cardiac pacemaker in situ 11/30/2008    Past Surgical History:  Procedure Laterality Date  . ANTERIOR CRUCIATE LIGAMENT REPAIR Right 1994  . CATARACT EXTRACTION, BILATERAL  08/2010  . COLONOSCOPY  2001, 2008  . FLEXIBLE SIGMOIDOSCOPY  2001  . FOOT SURGERY Left 2005   Fot repair after chainsaw accident  . INSERTION OF MESH N/A 08/31/2016   Procedure: INSERTION OF MESH;  Surgeon: Jackolyn Confer, MD;  Location: [facility];  Service: General;  Laterality: N/A;  .  MENISCUS REPAIR Left 2004  . OTHER SURGICAL HISTORY  1995   Ear surgery due to mastoiditis  . PACEMAKER INSERTION  11/26/08   [name]  . SHOULDER ARTHROSCOPY WITH SUBACROMIAL DECOMPRESSION Left 10/20/2013   Procedure: LEFT SHOULDER SCOPE/DISTAL ACROMIOPLASTY, LABRAL DEBRIDMENT;  Surgeon: [name], MD;  Location: [facility];  Service: Orthopedics;  Laterality: Left;  . SHOULDER SURGERY Right 11/2002  . SHOULDER SURGERY Right 2011  . THYROGLOSSAL DUCT CYST    . TONSILLECTOMY AND ADENOIDECTOMY    . UMBILICAL HERNIA REPAIR N/A 08/31/2016    Procedure: UMBILICAL HERNIA REPAIR WITH MESH;  Surgeon: Jackolyn Confer, MD;  Location: [facility];  Service: General;  Laterality: N/A;  . VASECTOMY          Home Medications    Prior to Admission medications   Medication Sig Start Date End Date Taking? Authorizing Provider  alfuzosin (UROXATRAL) 10 MG 24 hr tablet Take 10 mg by mouth at bedtime.   Yes [provider]  ALPRAZolam Duanne Moron) 0.5 MG tablet Take 1 tablet (0.5 mg total) by mouth daily as needed for anxiety. 11/25/18  Yes Shelda Pal, DO  ezetimibe (ZETIA) 10 MG tablet Take 1 tablet (10 mg total) by mouth daily. 05/24/18  Yes Wendling, Crosby Oyster, DO  Flax Oil-Fish Oil-Borage Oil CAPS Take 1 capsule by mouth daily.   Yes [provider]  levocetirizine (XYZAL) 5 MG tablet Take 1 tablet (5 mg total) by mouth every evening. 01/10/18  Yes Shelda Pal, DO  levothyroxine (SYNTHROID) 175 MCG tablet TAKE 1 TABLET (175 MCG TOTAL) BY MOUTH DAILY BEFORE BREAKFAST. 07/18/18  Yes Shelda Pal, DO  metoprolol tartrate (LOPRESSOR) 25 MG tablet Take 1 tablet (25 mg total) by mouth 2 (two) times daily. 07/23/18  Yes Deboraha Sprang, MD  MYRBETRIQ 50 MG TB24 tablet Take 50 mg by mouth daily.  04/10/16  Yes [provider]  NON FORMULARY C- PAP MACHINE   Yes [provider]  OVER THE COUNTER MEDICATION Take 1 capsule by mouth daily. Amazing Grape Supplement   Yes [provider]  silver sulfADIAZINE (SILVADENE) 1 % cream Apply 1 application topically daily. Over burn. Patient not taking: Reported on 12/25/2018 08/08/17   Shelda Pal, DO    Family History Family History  Problem Relation Age of Onset  . CVA Mother   . Hypercholesterolemia Mother   . Goiter Mother   . Diabetes Mother        DM  . Hypertension Mother   . CAD Mother   . Cirrhosis Father        due to alcohol  . Cancer Sister        Breast  . Hypercholesterolemia Brother   . Hypertension  Brother   . CAD Brother   . Heart attack Son 59    Social History Social History   Tobacco Use  . Smoking status: Former Smoker    Packs/day: 1.00    Years: 55.00    Pack years: 55.00    Types: Cigarettes    Quit date: 01/16/2006    Years since quitting: 12.9  . Smokeless tobacco: Former Systems developer    Types: Chew    Quit date: 09/16/2013  . Tobacco comment: 1/2-1 pack a day  Substance Use Topics  . Alcohol use: Yes    Comment: rarely drinks wine  . Drug use: No     Allergies   Lipitor [atorvastatin]   Review of Systems Review  of Systems  Constitutional: Positive for chills and fever.  HENT: Negative for sore throat.   Eyes: Negative for redness.  Respiratory: Positive for chest tightness and shortness of breath.   Cardiovascular: Positive for chest pain. Negative for leg swelling.  Gastrointestinal: Positive for abdominal pain, diarrhea and nausea. Negative for blood in stool, constipation and vomiting.  Genitourinary: Positive for frequency and urgency. Negative for difficulty urinating, dysuria, enuresis and flank pain.  Musculoskeletal: Negative for back pain.  Skin: Negative for pallor and wound.  Neurological: Negative for seizures, light-headedness and headaches.     Physical Exam Updated Vital Signs BP 138/72 (BP Location: Left Arm)   Pulse 74   Temp 97.8 F (36.6 C) (Oral)   Resp (!) 23   Ht 5\' 11"  (1.803 m)   Wt (!) 143.8 kg   SpO2 91%   BMI 44.21 kg/m   Physical Exam Vitals signs and nursing note reviewed.  Constitutional:      Appearance: Normal appearance. He is ill-appearing.  HENT:     Head: Normocephalic and atraumatic.  Eyes:     Pupils: Pupils are equal, round, and reactive to light.  Neck:     Musculoskeletal: Normal range of motion and neck supple.  Cardiovascular:     Rate and Rhythm: Normal rate.     Pulses:          Carotid pulses are 2+ on the right side and 2+ on the left side.      Radial pulses are 2+ on the right side and 2+ on  the left side.       Femoral pulses are 2+ on the right side and 2+ on the left side.      Popliteal pulses are 2+ on the right side and 2+ on the left side.       Dorsalis pedis pulses are 2+ on the right side and 2+ on the left side.       Posterior tibial pulses are 2+ on the right side and 2+ on the left side.     Comments: No bilateral pitting edema. Pulmonary:     Effort: Pulmonary effort is normal.     Comments: Breath sounds are diminished throughout.  Abdominal:     General: Abdomen is flat. There is no distension.     Palpations: Abdomen is soft.     Tenderness: There is no abdominal tenderness. There is no right CVA tenderness, left CVA tenderness or guarding.  Musculoskeletal:     Right lower leg: No edema.     Left lower leg: No edema.  Skin:    General: Skin is warm and dry.  Neurological:     Mental Status: He is alert and oriented to person, place, and time.      ED Treatments / Results  Labs (all labs ordered are listed, but only abnormal results are displayed) Labs Reviewed  SARS CORONAVIRUS 2 AG (30 MIN TAT) - Abnormal; Notable for the following components:      Result Value   SARS Coronavirus 2 Ag POSITIVE (*)    All other components within normal limits  CBC WITH DIFFERENTIAL/PLATELET - Abnormal; Notable for the following components:   Platelets 99 (*)    Lymphs Abs 0.6 (*)    All other components within normal limits  COMPREHENSIVE METABOLIC PANEL - Abnormal; Notable for the following components:   Sodium 130 (*)    Glucose, Bld 158 (*)    Calcium 8.4 (*)  AST 67 (*)    ALT 59 (*)    Alkaline Phosphatase 31 (*)    All other components within normal limits  URINALYSIS, ROUTINE W REFLEX MICROSCOPIC - Abnormal; Notable for the following components:   Protein, ur 30 (*)    All other components within normal limits  D-DIMER, QUANTITATIVE (NOT AT [facility]) - Abnormal; Notable for the following components:   D-Dimer, Quant 0.55 (*)    All other components  within normal limits  FIBRINOGEN - Abnormal; Notable for the following components:   Fibrinogen 522 (*)    All other components within normal limits  LACTATE DEHYDROGENASE - Abnormal; Notable for the following components:   LDH 277 (*)    All other components within normal limits  C-REACTIVE PROTEIN - Abnormal; Notable for the following components:   CRP 3.9 (*)    All other components within normal limits  URINALYSIS, MICROSCOPIC (REFLEX) - Abnormal; Notable for the following components:   Bacteria, UA FEW (*)    All other components within normal limits  HEPATITIS B SURFACE ANTIBODY, QUANTITATIVE - Abnormal; Notable for the following components:   Hepatitis B-Post <3.1 (*)    All other components within normal limits  CBC WITH DIFFERENTIAL/PLATELET - Abnormal; Notable for the following components:   WBC 3.5 (*)    Platelets 110 (*)    Lymphs Abs 0.6 (*)    All other components within normal limits  COMPREHENSIVE METABOLIC PANEL - Abnormal; Notable for the following components:   Glucose, Bld 175 (*)    Calcium 8.2 (*)    Albumin 3.3 (*)    AST 47 (*)    ALT 51 (*)    Alkaline Phosphatase 29 (*)    All other components within normal limits  C-REACTIVE PROTEIN - Abnormal; Notable for the following components:   CRP 5.0 (*)    All other components within normal limits  D-DIMER, QUANTITATIVE (NOT AT [facility]) - Abnormal; Notable for the following components:   D-Dimer, Quant 0.55 (*)    All other components within normal limits  FERRITIN - Abnormal; Notable for the following components:   Ferritin 491 (*)    All other components within normal limits  CBC WITH DIFFERENTIAL/PLATELET - Abnormal; Notable for the following components:   WBC 2.9 (*)    Platelets 133 (*)    Lymphs Abs 0.5 (*)    All other components within normal limits  COMPREHENSIVE METABOLIC PANEL - Abnormal; Notable for the following components:   Glucose, Bld 180 (*)    Calcium 8.3 (*)    Total Protein 6.3 (*)     Albumin 3.1 (*)    AST 49 (*)    ALT 56 (*)    Alkaline Phosphatase 27 (*)    All other components within normal limits  C-REACTIVE PROTEIN - Abnormal; Notable for the following components:   CRP 3.0 (*)    All other components within normal limits  FERRITIN - Abnormal; Notable for the following components:   Ferritin 575 (*)    All other components within normal limits  CBC WITH DIFFERENTIAL/PLATELET - Abnormal; Notable for the following components:   WBC 3.7 (*)    Lymphs Abs 0.6 (*)    All other components within normal limits  COMPREHENSIVE METABOLIC PANEL - Abnormal; Notable for the following components:   Glucose, Bld 170 (*)    Calcium 8.3 (*)    Total Protein 6.1 (*)    Albumin 3.3 (*)    AST 99 (*)  ALT 89 (*)    Alkaline Phosphatase 27 (*)    All other components within normal limits  C-REACTIVE PROTEIN - Abnormal; Notable for the following components:   CRP 1.2 (*)    All other components within normal limits  FERRITIN - Abnormal; Notable for the following components:   Ferritin 724 (*)    All other components within normal limits  CULTURE, BLOOD (ROUTINE X 2)  CULTURE, BLOOD (ROUTINE X 2)  LIPASE, BLOOD  BRAIN NATRIURETIC PEPTIDE  LACTIC ACID, PLASMA  PROCALCITONIN  HEPATITIS B SURFACE ANTIGEN  MAGNESIUM  PHOSPHORUS  D-DIMER, QUANTITATIVE (NOT AT [facility])  MAGNESIUM  PHOSPHORUS  D-DIMER, QUANTITATIVE (NOT AT [facility])  MAGNESIUM  PHOSPHORUS  ABO/RH  TROPONIN I (HIGH SENSITIVITY)    EKG EKG Interpretation  Date/Time:  Tuesday December 24 2018 14:48:44 EST Ventricular Rate:  84 PR Interval:    QRS Duration: 94 QT Interval:  359 QTC Calculation: 425 R Axis:   -11 Text Interpretation: Sinus rhythm Low voltage, precordial leads No significant change since last tracing Confirmed by Deno Etienne [phone]) on 01/01/2019 2:50:32 PM   Radiology DG CHEST PORT 1 VIEW  Result Date: 12/26/2018 CLINICAL DATA:  Shortness of breath. EXAM: PORTABLE CHEST 1 VIEW  COMPARISON:  12/28/2018 FINDINGS: The heart is upper limits of normal in size given the AP projection and portable technique. Stable tortuosity and calcification of the thoracic aorta. Persistent patchy lower lobe infiltrates with slight worsening basilar aeration bilaterally. No definite pleural effusions or pulmonary edema. IMPRESSION: Persistent bibasilar infiltrates with slight worsening aeration. Electronically Signed   By: Marijo Sanes M.D.   On: 12/26/2018 11:43    Procedures Procedures (including critical care time)  Medications Ordered in ED Medications  dexamethasone (DECADRON) injection 6 mg (6 mg Intravenous Given 12/26/18 1647)  remdesivir 200 mg in sodium chloride 0.9% 250 mL IVPB (200 mg Intravenous New Bag/Given 12/31/2018 2359)    Followed by  remdesivir 100 mg in sodium chloride 0.9 % 100 mL IVPB (100 mg Intravenous New Bag/Given 12/26/18 0916)  guaiFENesin-dextromethorphan (ROBITUSSIN DM) 100-10 MG/5ML syrup 10 mL (has no administration in time range)  chlorpheniramine-HYDROcodone (TUSSIONEX) 10-8 MG/5ML suspension 5 mL (5 mLs Oral Given 12/25/18 2103)  vitamin C (ASCORBIC ACID) tablet 500 mg (500 mg Oral Given 12/27/18 0907)  zinc sulfate capsule 220 mg (220 mg Oral Given 12/27/18 0907)  acetaminophen (TYLENOL) tablet 650 mg (has no administration in time range)  enoxaparin (LOVENOX) injection 70 mg (70 mg Subcutaneous Given 12/27/18 0052)  dextromethorphan-guaiFENesin ([place] DM) 30-600 MG per 12 hr tablet 1 tablet (1 tablet Oral Given 12/27/18 0907)  ondansetron (ZOFRAN) injection 4 mg (has no administration in time range)  feeding supplement (ENSURE ENLIVE) (ENSURE ENLIVE) liquid 237 mL (237 mLs Oral Given 12/27/18 0907)  ALPRAZolam (XANAX) tablet 0.5 mg (has no administration in time range)  levothyroxine (SYNTHROID) tablet 175 mcg (175 mcg Oral Given by Other 12/27/18 0803)  mirabegron ER (MYRBETRIQ) tablet 50 mg (50 mg Oral Given 12/27/18 0907)  alfuzosin (UROXATRAL)  24 hr tablet 10 mg (10 mg Oral Given 12/26/18 2130)  loratadine (CLARITIN) tablet 10 mg (10 mg Oral Given 12/27/18 0907)  ezetimibe (ZETIA) tablet 10 mg (10 mg Oral Given 12/27/18 0907)  0.9 %  sodium chloride infusion (250 mLs Intravenous New Bag/Given 12/26/18 0912)  furosemide (LASIX) injection 60 mg (60 mg Intravenous Given 12/27/18 0900)  acetaminophen (TYLENOL) tablet 650 mg (650 mg Oral Given 12/21/2018 1514)  tocilizumab (ACTEMRA) 800 mg in sodium chloride 0.9 %  100 mL infusion (800 mg Intravenous New Bag/Given 12/25/18 0059)     Initial Impression / Assessment and Plan / ED Course  I have reviewed the triage vital signs and the nursing notes.  Pertinent labs & imaging results that were available during my care of the patient were reviewed by me and considered in my medical decision making (see chart for details).     Patient with extensive past medical history including a pacemaker placement along with A. fib presents to the ED with complaints of diarrhea, abdominal pain for the past 3 weeks.  He also reports some shortness of breath, has felt some urinary changes.  According to him he weighed himself and was around 290 lbs on her scale he is 317, he does arrive in the ED with a temperature of 100.7, he is hypoxic satting at 87% on room air, tachypnea is present along with an elevated heart rate.  He also endorses center chest pain without any radiation.  There is no pitting edema to his legs.  He was placed on 6 L of O2, he is currently not on any oxygen supplementation at home.  Reports there has been no blood in his stool, he does have some urinary urgency along with frequency however medications such as Myrbetriq are noted on his chart.  Bladder scan preformed 40 ml in bladder, no signs of retention.  Xray of his chest showed: 1. Shallow inflation.  2. Developing infiltrates in the lung bases.       CBC without any leukocytosis.  Hemoglobin is within normal limits.  CMP with some  mild hyponatremia LFTs are elevated on today's visit as his previous results.  Lipase is within normal limits.  Lactic is negative.  BNP is within normal limits, he does not have any prior history of heart failure.  Troponins negative.  COVID-19 swab was positive.  3:57 PM I have attempted to contact wife, however no response x 2. Due to patient's new oxygen requirement will place call for hospitalist admission.  4:37 PM Spoke to Dr. Olevia Bowens who will admit patient, due to delayed in transfer medications such as Remdesivir and Actemra, this medications are currently not well about med [place], therefore these medications will need to be obtained from [facility].  Patient is #11 on the transfer list, she will likely be moved to [facility] around midnight tonight.   Portions of this note were generated with [employer]. Dictation errors may occur despite best attempts at proofreading.  Final Clinical Impressions(s) / ED Diagnoses   Final diagnoses:  Diarrhea, unspecified type  COVID-19 virus infection    ED Discharge Orders    None       Janeece Fitting, PA-C 12/27/18 [place], DO 12/28/18 2303

## 2018-12-24 NOTE — H&P (Addendum)
History and Physical  Arthur Daniel [id number] DOB: Oct 29, 1940 DOA: 12/22/2018  Referring physician: Janeece Fitting PA-C PCP: Shelda Pal, DO  Patient coming from: [facility]  Chief Complaint: Diarrhea, abdominal pain and shortness of breath  HPI: Arthur Daniel is a 78 y.o. male with medical history significant for A. fib, pacemaker, hyperlipidemia, hypothyroidism who presents to [facility] ED due to 3-week history of abdominal pain (epigastric) with several episodes of nonbloody diarrhea ( 4-6 daily) with last episode being this morning, as well as increased urinary frequency.  No alleviating/aggravating factor for the abdominal pain and it was associated with shortness of breath when he takes deep breath, patient also complained of nonradiating midsternal chest pain.  He denies  vomiting, he states that he has been in self-isolation at home for the past 3 weeks and denies any sick contacts.   ED Course:  He was noted to be febrile with a temperature of 100.84F, was tachypneic and hypoxic with an O2 sat of 87% dose requiring supplemental oxygen via HFNC at 8lpm.  BP was 137/65.  Work-up in the ED showed thrombocytopenia, hyponatremia, hyperglycemia, elevated transaminitis, urinalysis was normal, procalcitonin was <0.10, LDH was elevated at 177, CRP 3.9, fibrinogen 522, LDH 277, lactic acid 1.2. SARS coronavirus 2 Ag was positive.  Developing infiltrates in the lung bases. He was treated with IV Decadron 6 mg, remdesivir, Actemra and Tylenol.  Patient was transferred to [facility] for further management.  Review of Systems: Constitutional: Positive for chills and fever.  HENT: Negative for ear pain and sore throat.   Eyes: Negative for pain and visual disturbance.  Respiratory: Positive for shortness of breath (ontaking deep breaths) and  chest tightness    Cardiovascular: Positive for chest pain.  Negative for palpitations.  Gastrointestinal: Positive for diarrhea, nausea and abdominal pain   Endocrine: Negative for polyphagia and polyuria.  Genitourinary: Increased urinary frequency and urgency.   Musculoskeletal: Negative for arthralgias and back pain.  Skin: Negative for color change and rash.  Allergic/Immunologic: Negative for immunocompromised state.  Neurological: Negative for tremors, syncope, speech difficulty, light-headedness and headaches.    Past Medical History:  Diagnosis Date  . 10 year risk of MI or stroke 7.5% or greater 04/28/2016  . Allergic rhinitis, cause unspecified   . BPH (benign prostatic hyperplasia) 10/18/2015  . Diverticulosis   . ED (erectile dysfunction)   . Essential hypertension, benign   . Hernia, umbilical   . Lump or mass in breast   . Mixed hyperlipidemia   . Obesity   . Pacemaker    [place], [place] 2010  . Postablative hypothyroidism   . Sleep apnea    Use CPAP machine  . Ureteral stone    Dr. Risa Grill   Past Surgical History:  Procedure Laterality Date  . ANTERIOR CRUCIATE LIGAMENT REPAIR Right 1994  . CATARACT EXTRACTION, BILATERAL  08/2010  . COLONOSCOPY  2001, 2008  . FLEXIBLE SIGMOIDOSCOPY  2001  . FOOT SURGERY Left 2005   Fot repair after chainsaw accident  . INSERTION OF MESH N/A 08/31/2016   Procedure: INSERTION OF MESH;  Surgeon: Jackolyn Confer, MD;  Location: [facility];  Service: General;  Laterality: N/A;  . MENISCUS REPAIR Left 2004  . OTHER SURGICAL HISTORY  1995   Ear surgery due to mastoiditis  . PACEMAKER INSERTION  11/26/08   [name]  . SHOULDER ARTHROSCOPY WITH SUBACROMIAL DECOMPRESSION Left 10/20/2013   Procedure: LEFT SHOULDER SCOPE/DISTAL ACROMIOPLASTY, LABRAL DEBRIDMENT;  Surgeon: [name], MD;  Location: [facility];  Service: Orthopedics;  Laterality: Left;  . SHOULDER SURGERY Right 11/2002  . SHOULDER SURGERY Right 2011  . THYROGLOSSAL DUCT CYST    . TONSILLECTOMY AND ADENOIDECTOMY    . UMBILICAL HERNIA REPAIR N/A 08/31/2016   Procedure: UMBILICAL HERNIA REPAIR WITH MESH;  Surgeon:  Jackolyn Confer, MD;  Location: [facility];  Service: General;  Laterality: N/A;  . VASECTOMY      Social History:  reports that he quit smoking about 12 years ago. His smoking use included cigarettes. He has a 55.00 pack-year smoking history. He quit smokeless tobacco use about 5 years ago.  His smokeless tobacco use included chew. He reports current alcohol use. He reports that he does not use drugs.   Allergies  Allergen Reactions  . Lipitor [Atorvastatin] Other (See Comments)    Breast lumps    Family History  Problem Relation Age of Onset  . CVA Mother   . Hypercholesterolemia Mother   . Goiter Mother   . Diabetes Mother        DM  . Hypertension Mother   . CAD Mother   . Cirrhosis Father        due to alcohol  . Cancer Sister        Breast  . Hypercholesterolemia Brother   . Hypertension Brother   . CAD Brother   . Heart attack Son 40     Prior to Admission medications   Medication Sig Start Date End Date Taking? Authorizing Provider  alfuzosin (UROXATRAL) 10 MG 24 hr tablet Take 10 mg by mouth at bedtime.    [provider]  ALPRAZolam Duanne Moron) 0.5 MG tablet Take 1 tablet (0.5 mg total) by mouth daily as needed for anxiety. 11/25/18   Shelda Pal, DO  ezetimibe (ZETIA) 10 MG tablet Take 1 tablet (10 mg total) by mouth daily. 05/24/18   Shelda Pal, DO  levocetirizine (XYZAL) 5 MG tablet Take 1 tablet (5 mg total) by mouth every evening. 01/10/18   Shelda Pal, DO  levothyroxine (SYNTHROID) 175 MCG tablet TAKE 1 TABLET (175 MCG TOTAL) BY MOUTH DAILY BEFORE BREAKFAST. 07/18/18   Wendling, Crosby Oyster, DO  metoprolol tartrate (LOPRESSOR) 25 MG tablet Take 1 tablet (25 mg total) by mouth 2 (two) times daily. 07/23/18   Deboraha Sprang, MD  MYRBETRIQ 50 MG TB24 tablet Take 50 mg by mouth daily.  04/10/16   [provider]  NON FORMULARY C- PAP MACHINE    [provider]  OVER THE COUNTER MEDICATION Take 1 capsule by  mouth daily. Amazing Grape Supplement    [provider]  silver sulfADIAZINE (SILVADENE) 1 % cream Apply 1 application topically daily. Over burn. 08/08/17   Shelda Pal, DO    Physical Exam: BP (!) 144/69 (BP Location: Left Arm)   Pulse 77   Temp 98.6 F (37 C) (Oral)   Resp (!) 21   Ht 5\' 11"  (1.803 m)   Wt (!) 143.8 kg   SpO2 93%   BMI 44.21 kg/m   . General: 78 y.o. year-old male well developed, ill-appearing, but in no acute distress.  Alert and oriented x3. Marland Kitchen HEENT: Normocephalic, atraumatic, PERRL . Cardiovascular: Regular rate and rhythm with no rubs or gallops.  No thyromegaly or JVD noted.  No lower extremity edema. 2/4 pulses in all 4 extremities. Marland Kitchen Respiratory: Diffused decreased breath sounds.  No accessory muscle  . Abdomen: Soft nontender nondistended with normal bowel  sounds x4 quadrants. . Muskuloskeletal: No cyanosis, clubbing or edema noted bilaterally . Neuro: CN II-XII intact, strength, sensation, reflexes . Skin: No ulcerative lesions noted or rashes . Psychiatry: Judgement and insight appear normal. Mood is appropriate for condition and setting          Labs on Admission:  Basic Metabolic Panel: Recent Labs  Lab 01/08/2019 1508  NA 130*  K 3.9  CL 98  CO2 23  GLUCOSE 158*  BUN 14  CREATININE 1.13  CALCIUM 8.4*   Liver Function Tests: Recent Labs  Lab 12/30/2018 1508  AST 67*  ALT 59*  ALKPHOS 31*  BILITOT 0.6  PROT 6.6  ALBUMIN 3.6   Recent Labs  Lab 01/12/2019 1508  LIPASE 31   No results for input(s): AMMONIA in the last 168 hours. CBC: Recent Labs  Lab 01/09/2019 1508  WBC 4.4  NEUTROABS 3.4  HGB 15.2  HCT 45.7  MCV 95.2  PLT 99*   Cardiac Enzymes: No results for input(s): CKTOTAL, CKMB, CKMBINDEX, TROPONINI in the last 168 hours.  BNP (last 3 results) Recent Labs    03/31/18 1731 12/19/2018 1509  BNP 42.1 39.6    ProBNP (last 3 results) No results for input(s): PROBNP in the last 8760  hours.  CBG: No results for input(s): GLUCAP in the last 168 hours.  Radiological Exams on Admission: Dg Chest Portable 1 View  Result Date: 01/11/2019 CLINICAL DATA:  Pt states that he has been having urinary frequency and diarrhea for a couple of weeks. Denies fever, denies blood in urine or stool. EXAM: PORTABLE CHEST 1 VIEW COMPARISON:  03/31/2018 FINDINGS: LEFT-sided transvenous pacemaker leads overlie the RIGHT atrium and RIGHT ventricle. Shallow lung inflation accentuate bronchovascular markings. However there is increased opacity in the lung bases, LEFT greater than RIGHT, consistent with developing infiltrates. No pulmonary edema. IMPRESSION: 1. Shallow inflation. 2. Developing infiltrates in the lung bases. Electronically Signed   By: Nolon Nations M.D.   On: 12/21/2018 15:30    EKG: I independently viewed the EKG done and my findings are as followed: Normal sinus rhythm at a rate of 84bpm  Assessment/Plan Present on Admission: . Acute respiratory failure with hypoxia ([place]) . COVID-19 . Hypothyroidism . Cardiac pacemaker in situ . BPH (benign prostatic hyperplasia) . Anxiety . Essential hypertension  Principal Problem:   COVID-19 Active Problems:   Hypothyroidism   Essential hypertension   Cardiac pacemaker in situ   BPH (benign prostatic hyperplasia)   Anxiety   Acute respiratory failure with hypoxia (HCC)   Dyslipidemia  Acute respiratory failure with hypoxia secondary to COVID-19 viral infection Patient was noted to be hypoxic on arrival at the ED with an O2 sat of 87% on room air and was provided with supplemental oxygen.  COVID-19 test was positive and chest x-ray showed developing infiltrates in the lung bases. Continue IV Decadron 6 mg Remdesivir per pharmacy protocol Continue Actemra Continue thiamine vit. C 500 mg and Zn 220 mg daily Continue mucolytics and antitussives Continue to obtain daily COVID inflammatory markers and adjust accordingly Continue  supplemental oxygen via [place] to obtain SPO2 > 93%.  Abdominal pain and nausea in the setting of acute diarrhea It is unknown at this time if patient diarrhea was infectious or due to above Patient has not had diarrhea since [facility]; stool culture will be obtained if patient continues to have diarrhea Continue Zofran as needed  BPH Patient complained of increased urinary frequency, bladder scan done only showed  40 mL in the bladder with no signs of retention. Resume patient's home medication when med rec is updated  Hyponatremia Na 130; continue to monitor sodium level with morning labs  Elevated transaminitis AST 67, ALT 59 Continue to monitor liver panel with morning labs  Chronic thrombocytopenia Platelets currently at 99, no obvious sign of bleeding Continue to monitor platelets troponin labs  Hypothyroidism Continue home levothyroxine when med rec is updated  Essential hypertension Continue home meds when med rec is updated  Dyslipidemia Continue home meds when med rec is updated  DVT prophylaxis: Lovenox  Code Status: Full code  Family Communication: None at bedside  Disposition Plan: Home once clinically stable  Consults called: None  Admission status: Inpatient due to patient's sudden requirement of oxygen supplementation in the setting of COVID-19 virus pneumonia requiring treatment for the Covid.  Bernadette Hoit MD [facility]  If 7PM-7AM, please contact night-coverage [email or web address]  12/25/2018, 12:34 AM

## 2018-12-24 NOTE — ED Triage Notes (Signed)
Pt states that he has been having urinary frequency and diarrhea for a couple of weeks. Denies fever, denies blood in urine or stool.

## 2018-12-24 NOTE — Progress Notes (Signed)
Pharmacy Note - Remdesivir Dosing  O:  ALT: 59 CXR:  Developing infiltrates in the lung bases Requiring supplemental O2: 94% on 8L per HFNC   A/P:  Patient meets criteria for remdesivir.  Begin remdesivir 200 mg IV x 1, followed by 100 mg IV daily x 4 days  Monitor ALT, clinical progress  Despina Pole, Pharm. D. Clinical Pharmacist 12/30/2018 10:48 PM

## 2018-12-25 DIAGNOSIS — U071 COVID-19: Secondary | ICD-10-CM | POA: Diagnosis present

## 2018-12-25 DIAGNOSIS — J1282 Pneumonia due to coronavirus disease 2019: Secondary | ICD-10-CM | POA: Diagnosis present

## 2018-12-25 DIAGNOSIS — A0839 Other viral enteritis: Secondary | ICD-10-CM | POA: Diagnosis present

## 2018-12-25 DIAGNOSIS — J1289 Other viral pneumonia: Secondary | ICD-10-CM

## 2018-12-25 DIAGNOSIS — E785 Hyperlipidemia, unspecified: Secondary | ICD-10-CM

## 2018-12-25 DIAGNOSIS — E038 Other specified hypothyroidism: Secondary | ICD-10-CM

## 2018-12-25 DIAGNOSIS — D696 Thrombocytopenia, unspecified: Secondary | ICD-10-CM | POA: Diagnosis present

## 2018-12-25 LAB — CBC WITH DIFFERENTIAL/PLATELET
Abs Immature Granulocytes: 0.01 10*3/uL (ref 0.00–0.07)
Basophils Absolute: 0 10*3/uL (ref 0.0–0.1)
Basophils Relative: 0 %
Eosinophils Absolute: 0 10*3/uL (ref 0.0–0.5)
Eosinophils Relative: 0 %
HCT: 45.1 % (ref 39.0–52.0)
Hemoglobin: 14.8 g/dL (ref 13.0–17.0)
Immature Granulocytes: 0 %
Lymphocytes Relative: 16 %
Lymphs Abs: 0.6 10*3/uL — ABNORMAL LOW (ref 0.7–4.0)
MCH: 31.4 pg (ref 26.0–34.0)
MCHC: 32.8 g/dL (ref 30.0–36.0)
MCV: 95.6 fL (ref 80.0–100.0)
Monocytes Absolute: 0.3 10*3/uL (ref 0.1–1.0)
Monocytes Relative: 9 %
Neutro Abs: 2.7 10*3/uL (ref 1.7–7.7)
Neutrophils Relative %: 75 %
Platelets: 110 10*3/uL — ABNORMAL LOW (ref 150–400)
RBC: 4.72 MIL/uL (ref 4.22–5.81)
RDW: 13 % (ref 11.5–15.5)
WBC: 3.5 10*3/uL — ABNORMAL LOW (ref 4.0–10.5)
nRBC: 0 % (ref 0.0–0.2)

## 2018-12-25 LAB — COMPREHENSIVE METABOLIC PANEL
ALT: 51 U/L — ABNORMAL HIGH (ref 0–44)
AST: 47 U/L — ABNORMAL HIGH (ref 15–41)
Albumin: 3.3 g/dL — ABNORMAL LOW (ref 3.5–5.0)
Alkaline Phosphatase: 29 U/L — ABNORMAL LOW (ref 38–126)
Anion gap: 12 (ref 5–15)
BUN: 12 mg/dL (ref 8–23)
CO2: 25 mmol/L (ref 22–32)
Calcium: 8.2 mg/dL — ABNORMAL LOW (ref 8.9–10.3)
Chloride: 98 mmol/L (ref 98–111)
Creatinine, Ser: 0.91 mg/dL (ref 0.61–1.24)
GFR calc Af Amer: >60 mL/min (ref >60)
GFR calc non Af Amer: >60 mL/min (ref >60)
Glucose, Bld: 175 mg/dL — ABNORMAL HIGH (ref 70–99)
Potassium: 4.2 mmol/L (ref 3.5–5.1)
Sodium: 135 mmol/L (ref 135–145)
Total Bilirubin: 0.7 mg/dL (ref 0.3–1.2)
Total Protein: 6.5 g/dL (ref 6.5–8.1)

## 2018-12-25 LAB — MAGNESIUM: Magnesium: 2.3 mg/dL (ref 1.7–2.4)

## 2018-12-25 LAB — ABO/RH: ABO/RH(D): A NEG

## 2018-12-25 LAB — C-REACTIVE PROTEIN: CRP: 5 mg/dL — ABNORMAL HIGH (ref <1.0)

## 2018-12-25 LAB — D-DIMER, QUANTITATIVE: D-Dimer, Quant: 0.55 ug/mL-FEU — ABNORMAL HIGH (ref 0.00–0.50)

## 2018-12-25 LAB — FERRITIN: Ferritin: 491 ng/mL — ABNORMAL HIGH (ref 24–336)

## 2018-12-25 LAB — PHOSPHORUS: Phosphorus: 3 mg/dL (ref 2.5–4.6)

## 2018-12-25 MED ORDER — DM-GUAIFENESIN ER 30-600 MG PO TB12
1.0000 | ORAL_TABLET | Freq: Two times a day (BID) | ORAL | Status: DC
  Administered 2018-12-25 – 2019-01-08 (×31): 1 via ORAL
  Filled 2018-12-25 (×33): qty 1

## 2018-12-25 MED ORDER — METOPROLOL TARTRATE 25 MG PO TABS: 25.0000 mg | ORAL_TABLET | Freq: Two times a day (BID) | ORAL | Status: DC

## 2018-12-25 MED ORDER — ENSURE ENLIVE PO LIQD
237.0000 mL | Freq: Two times a day (BID) | ORAL | Status: DC
  Administered 2018-12-25 – 2019-01-01 (×12): 237 mL via ORAL
  Filled 2018-12-25 (×3): qty 237

## 2018-12-25 MED ORDER — LORATADINE 10 MG PO TABS
10.0000 mg | ORAL_TABLET | Freq: Every day | ORAL | Status: DC
  Administered 2018-12-26 – 2019-01-09 (×15): 10 mg via ORAL
  Filled 2018-12-25 (×15): qty 1

## 2018-12-25 MED ORDER — EZETIMIBE 10 MG PO TABS
10.0000 mg | ORAL_TABLET | Freq: Every day | ORAL | Status: DC
  Administered 2018-12-26 – 2019-01-09 (×15): 10 mg via ORAL
  Filled 2018-12-25 (×17): qty 1

## 2018-12-25 MED ORDER — LEVOTHYROXINE SODIUM 175 MCG PO TABS
175.0000 ug | ORAL_TABLET | Freq: Every day | ORAL | Status: DC
  Administered 2018-12-26: 50 ug via ORAL
  Administered 2018-12-26: 09:00:00 75 ug via ORAL
  Administered 2018-12-27 – 2019-01-09 (×14): 175 ug via ORAL
  Filled 2018-12-25 (×19): qty 1

## 2018-12-25 MED ORDER — ALPRAZOLAM 0.25 MG PO TABS
0.5000 mg | ORAL_TABLET | Freq: Every day | ORAL | Status: DC | PRN
  Administered 2019-01-01 – 2019-01-08 (×5): 0.5 mg via ORAL
  Filled 2018-12-25 (×5): qty 2

## 2018-12-25 MED ORDER — ALFUZOSIN HCL ER 10 MG PO TB24
10.0000 mg | ORAL_TABLET | Freq: Every day | ORAL | Status: DC
  Administered 2018-12-25 – 2019-01-10 (×15): 10 mg via ORAL
  Filled 2018-12-25 (×21): qty 1

## 2018-12-25 MED ORDER — MIRABEGRON ER 50 MG PO TB24
50.0000 mg | ORAL_TABLET | Freq: Every day | ORAL | Status: DC
  Administered 2018-12-25 – 2019-01-13 (×18): 50 mg via ORAL
  Filled 2018-12-25 (×21): qty 1

## 2018-12-25 MED ORDER — ONDANSETRON HCL 4 MG/2ML IJ SOLN: 4.0000 mg | Freq: Four times a day (QID) | INTRAMUSCULAR | Status: DC | PRN

## 2018-12-25 MED ORDER — LEVOCETIRIZINE DIHYDROCHLORIDE 5 MG PO TABS: 5.0000 mg | ORAL_TABLET | Freq: Every evening | ORAL | Status: DC

## 2018-12-25 NOTE — Progress Notes (Signed)
PROGRESS NOTE    Arthur Daniel  [id number] DOB: 07/09/1940 DOA: 01/01/2019 PCP: Shelda Pal, DO   Brief Narrative:  Arthur Daniel is a 78 y.o WM PMHx  A. fib, pacemaker, hyperlipidemia, hypothyroidism   Presents to [facility] ED due to 3-week history of abdominal pain (epigastric) with several episodes of nonbloody diarrhea ( 4-6 daily) with last episode being this morning, as well as increased urinary frequency.  No alleviating/aggravating factor for the abdominal pain and it was associated with shortness of breath when he takes deep breath, patient also complained of nonradiating midsternal chest pain.  He denies  vomiting, he states that he has been in self-isolation at home for the past 3 weeks and denies any sick contacts.   ED Course:  He was noted to be febrile with a temperature of 100.78F, was tachypneic and hypoxic with an O2 sat of 87% dose requiring supplemental oxygen via HFNC at 8lpm.  BP was 137/65.  Work-up in the ED showed thrombocytopenia, hyponatremia, hyperglycemia, elevated transaminitis, urinalysis was normal, procalcitonin was <0.10, LDH was elevated at 177, CRP 3.9, fibrinogen 522, LDH 277, lactic acid 1.2. SARS coronavirus 2 Ag was positive.  Developing infiltrates in the lung bases. He was treated with IV Decadron 6 mg, remdesivir, Actemra and Tylenol.  Patient was transferred to [facility] for further management.   Subjective: Last 24 hrs T-max 38.2 C, negative abdominal pain, negative N/V.  Positive S OB.  States on CPAP at home, negative smoker.  States wife is getting checked today.   Assessment & Plan:   Principal Problem:   COVID-19 Active Problems:   Hypothyroidism   Essential hypertension   Cardiac pacemaker in situ   BPH (benign prostatic hyperplasia)   Anxiety   Acute respiratory failure with hypoxia (HCC)   Dyslipidemia  Covid pneumonia/acute respiratory failure with hypoxia COVID-19 Labs  Recent Labs    12/26/2018 1512 01/14/2019 1659  12/25/18 0350  DDIMER 0.55*  --  0.55*  FERRITIN  --   --  491*  LDH  --  277*  --   CRP  --  3.9* 5.0*   12/8 SARS coronavirus positive  atient was noted to be hypoxic on arrival at the ED with an O2 sat of 87% on room air and was provided with supplemental oxygen.  COVID-19 test was positive and chest x-ray showed developing infiltrates in the lung bases. Continue IV Decadron 6 mg Remdesivir per pharmacy protocol Continue Actemra Continue thiamine vit. C 500 mg and Zn 220 mg daily Continue mucolytics and antitussives Continue to obtain daily COVID inflammatory markers and adjust accordingly Continue supplemental oxygen via [place] to obtain SPO2 > 93%.   Covid gastroenteritis -Abdominal pain/diarrhea resolved most likely secondary to Covid infection -Continue Zofran as needed  BPH Patient complained of increased urinary frequency, bladder scan done only showed 40 mL in the bladder with no signs of retention. Resume patient's home medication when med rec is updated  Hyponatremia -Resolved  Elevated transaminitis AST 67, ALT 59 Continue to monitor liver panel with morning labs  Chronic thrombocytopenia Platelets currently at 99, no obvious sign of bleeding Continue to monitor platelets troponin labs  Hypothyroidism -Synthroid 175 mcg daily   Essential hypertension -12/9 patient's BP controlled without metoprolol hold at this time.  If patient's BP begins to climb would then start home medication.  HLD -Zetia 10 mg daily    DVT prophylaxis: Lovenox Code Status: Full Family Communication:  Disposition Plan: TBD   Consultants:  Procedures/Significant Events:     I have personally reviewed and interpreted all radiology studies and my findings are as above.  VENTILATOR SETTINGS: Nasal cannula 12/9 Flow; 4 L/min   Cultures 12/8 SARS coronavirus positive 12/8 blood LEFT wrist NGTD 12/8 blood RIGHT antecubital  NGTD   Antimicrobials: Anti-infectives (From admission, onward)   Start     Dose/Rate Stop   12/25/18 1000  remdesivir 100 mg in sodium chloride 0.9 % 100 mL IVPB  Status:  Discontinued     100 mg 200 mL/hr over 30 Minutes 12/27/2018 2257   12/25/18 1000  remdesivir 100 mg in sodium chloride 0.9 % 100 mL IVPB     100 mg 200 mL/hr over 30 Minutes 12/29/18 0959   12/17/2018 2330  remdesivir 200 mg in sodium chloride 0.9% 250 mL IVPB     200 mg 580 mL/hr over 30 Minutes 12/25/18 0029   01/04/2019 1630  remdesivir 200 mg in sodium chloride 0.9% 250 mL IVPB  Status:  Discontinued     200 mg 580 mL/hr over 30 Minutes 01/01/2019 2257       Devices    LINES / TUBES:      Continuous Infusions: . remdesivir 100 mg in NS 100 mL 100 mg (12/25/18 0928)     Objective: Vitals:   12/31/2018 2300 12/25/18 0000 12/25/18 0200 12/25/18 0744  BP: 129/65 135/65  129/71  Pulse: 78 72 82 77  Resp: (!) '26 20 20 ' (!) 21  Temp:    98.9 F (37.2 C)  TempSrc:    Oral  SpO2: 93% 93% 91% 93%  Weight:      Height:        Intake/Output Summary (Last 24 hours) at 12/25/2018 1048 Last data filed at 12/25/2018 0800 Gross per 24 hour  Intake 390 ml  Output 350 ml  Net 40 ml   Filed Weights   01/16/2019 1438 12/25/2018 1439  Weight: 131.5 kg (!) 143.8 kg    Examination:  General: A/O x4, positive acute respiratory distress Eyes: negative scleral hemorrhage, negative anisocoria, negative icterus ENT: Negative Runny nose, negative gingival bleeding, Neck:  Negative scars, masses, torticollis, lymphadenopathy, JVD Lungs: Tachypnea Clear to auscultation bilaterally without wheezes or crackles Cardiovascular: Regular rate and rhythm without murmur gallop or rub normal S1 and S2 Abdomen: negative abdominal pain, nondistended, positive soft, bowel sounds, no rebound, no ascites, no appreciable mass Extremities: No significant cyanosis, clubbing, or edema bilateral lower extremities Skin: Negative rashes,  lesions, ulcers Psychiatric:  Negative depression, negative anxiety, negative fatigue, negative mania  Central nervous system:  Cranial nerves II through XII intact, tongue/uvula midline, all extremities muscle strength 5/5, sensation intact throughout, negative dysarthria, negative expressive aphasia, negative receptive aphasia.  .     Data Reviewed: Care during the described time interval was provided by me .  I have reviewed this patient's available data, including medical history, events of note, physical examination, and all test results as part of my evaluation.   CBC: Recent Labs  Lab 01/14/2019 1508 12/25/18 0350  WBC 4.4 3.5*  NEUTROABS 3.4 2.7  HGB 15.2 14.8  HCT 45.7 45.1  MCV 95.2 95.6  PLT 99* 315*   Basic Metabolic Panel: Recent Labs  Lab 01/07/2019 1508 12/25/18 0350  NA 130* 135  K 3.9 4.2  CL 98 98  CO2 23 25  GLUCOSE 158* 175*  BUN 14 12  CREATININE 1.13 0.91  CALCIUM 8.4* 8.2*  MG  --  2.3  PHOS  --  3.0   GFR: Estimated Creatinine Clearance: 100.3 mL/min (by C-G formula based on SCr of 0.91 mg/dL). Liver Function Tests: Recent Labs  Lab 12/22/2018 1508 12/25/18 0350  AST 67* 47*  ALT 59* 51*  ALKPHOS 31* 29*  BILITOT 0.6 0.7  PROT 6.6 6.5  ALBUMIN 3.6 3.3*   Recent Labs  Lab 01/04/2019 1508  LIPASE 31   No results for input(s): AMMONIA in the last 168 hours. Coagulation Profile: No results for input(s): INR, PROTIME in the last 168 hours. Cardiac Enzymes: No results for input(s): CKTOTAL, CKMB, CKMBINDEX, TROPONINI in the last 168 hours. BNP (last 3 results) No results for input(s): PROBNP in the last 8760 hours. HbA1C: No results for input(s): HGBA1C in the last 72 hours. CBG: No results for input(s): GLUCAP in the last 168 hours. Lipid Profile: No results for input(s): CHOL, HDL, LDLCALC, TRIG, CHOLHDL, LDLDIRECT in the last 72 hours. Thyroid Function Tests: No results for input(s): TSH, T4TOTAL, FREET4, T3FREE, THYROIDAB in the  last 72 hours. Anemia Panel: Recent Labs    12/25/18 0350  FERRITIN 491*   Urine analysis:    Component Value Date/Time   COLORURINE YELLOW 12/17/2018 1900   APPEARANCEUR CLEAR 01/07/2019 1900   LABSPEC 1.015 12/18/2018 1900   PHURINE 6.0 01/11/2019 1900   GLUCOSEU NEGATIVE 12/18/2018 1900   HGBUR NEGATIVE 01/03/2019 1900   BILIRUBINUR NEGATIVE 01/02/2019 1900   KETONESUR NEGATIVE 12/21/2018 1900   PROTEINUR 30 (A) 01/01/2019 1900   UROBILINOGEN 1.0 10/03/2006 0054   NITRITE NEGATIVE 12/19/2018 1900   LEUKOCYTESUR NEGATIVE 01/07/2019 1900   Sepsis Labs: '@LABRCNTIP' (procalcitonin:4,lacticidven:4)  ) Recent Results (from the past 240 hour(s))  SARS Coronavirus 2 Ag (30 min TAT) - Nasal Swab (BD Veritor Kit)     Status: Abnormal   Collection Time: 12/31/2018  3:16 PM   Specimen: Nasal Swab (BD Veritor Kit)  Result Value Ref Range Status   SARS Coronavirus 2 Ag POSITIVE (A) NEGATIVE Final    Comment: RESULT CALLED TO, READ BACK BY AND VERIFIED WITH: MARVA SIMMS RN AT 9811 ON 01/09/2019 BY [email or web address] (NOTE) SARS-CoV-2 antigen PRESENT. Positive results indicate the presence of viral antigens, but clinical correlation with patient history and other diagnostic information is necessary to determine patient infection status.  Positive results do not rule out bacterial infection or co-infection  with other viruses. False positive results are rare but can occur, and confirmatory RT-PCR testing may be appropriate in some circumstances. The expected result is Negative. Fact Sheet for Patients: PodPark.tn Fact Sheet for Providers: GiftContent.is  This test is not yet approved or cleared by the [place] FDA and  has been authorized for detection and/or diagnosis of SARS-CoV-2 by FDA under an Emergency Use Authorization (EUA).  This EUA will remain in effect (meaning this test can be used) for the duration of  the COVID-1 9  declaration under Section 564(b)(1) of the Act, 21 U.S.C. section 360bbb-3(b)(1), unless the authorization is terminated or revoked sooner. Performed at [facility], [address], [place], [place] [address]          Radiology Studies: Dg Chest Portable 1 View  Result Date: 01/09/2019 CLINICAL DATA:  Pt states that he has been having urinary frequency and diarrhea for a couple of weeks. Denies fever, denies blood in urine or stool. EXAM: PORTABLE CHEST 1 VIEW COMPARISON:  03/31/2018 FINDINGS: LEFT-sided transvenous pacemaker leads overlie the RIGHT atrium and RIGHT ventricle. Shallow lung inflation accentuate bronchovascular markings. However there is increased  opacity in the lung bases, LEFT greater than RIGHT, consistent with developing infiltrates. No pulmonary edema. IMPRESSION: 1. Shallow inflation. 2. Developing infiltrates in the lung bases. Electronically Signed   By: Nolon Nations M.D.   On: 12/29/2018 15:30        Scheduled Meds: . dexamethasone (DECADRON) injection  6 mg Intravenous Q24H  . dextromethorphan-guaiFENesin  1 tablet Oral BID  . enoxaparin (LOVENOX) injection  70 mg Subcutaneous Q24H  . vitamin C  500 mg Oral Daily  . zinc sulfate  220 mg Oral Daily   Continuous Infusions: . remdesivir 100 mg in NS 100 mL 100 mg (12/25/18 0928)     LOS: 1 day   The patient is critically ill with multiple organ systems failure and requires high complexity decision making for assessment and support, frequent evaluation and titration of therapies, application of advanced monitoring technologies and extensive interpretation of multiple databases. Critical Care Time devoted to patient care services described in this note  Time spent: 40 minutes     Junetta Hearn, Geraldo Docker, MD [facility] Pager [phone]  If 7PM-7AM, please contact night-coverage [email or web address] Password [email or web address] 12/25/2018, 10:48 AM

## 2018-12-25 NOTE — Progress Notes (Addendum)
Initial Nutrition Assessment  RD working remotely.  DOCUMENTATION CODES:   Morbid obesity  INTERVENTION:  Recommend liberalizing diet to regular.  Provide Ensure Enlive po BID, each supplement provides 350 kcal and 20 grams of protein.  NUTRITION DIAGNOSIS:   Increased nutrient needs related to catabolic [id number] as evidenced by estimated needs.  GOAL:   Patient will meet greater than or equal to 90% of their needs  MONITOR:   PO intake, Supplement acceptance, Labs, Weight trends, I & O's  REASON FOR ASSESSMENT:   Malnutrition Screening Tool    ASSESSMENT:   78 year old male with PMHx of HLD, HTN, postablative hypothyroidism, diverticulosis, sleep apnea, A-fib, hx pacemaker placement in 2010 who presented after 3 weeks of epigastric pain and non-bloody diarrhea (4-6 episodes daily) and shortness of breath found to have COVID-19 viral infection.   Attempted to call patient over the phone but he was unable to answer. No meal documentation available in chart at this time. According to chart patient has been experiencing diarrhea, nausea, and abdominal pain which have all likely contributed to his decreased appetite. He also has increased nutrient needs in setting of COVID-19 viral infection. He would benefit from liberalized diet and oral nutrition supplements. Skin is intact. There are no documented episodes of diarrhea since admission.  Weights in chart appear to fluctuate but overall have increased this past year. He was previously 133.8-136.1 kg earlier this year. He was 141.6 kg on 11/25/2018 and on 12/19/2018 was 143.8 kg.  Medications reviewed and include: Decadron 6 mg Q24hrs IV, vitamin C 500 mg daily, zinc sulfate 220 mg daily, remdesivir.  Labs reviewed.  Unable to determine if patient meets criteria for malnutrition at this time.  NUTRITION - FOCUSED PHYSICAL EXAM:  Unable to complete.  Diet Order:   Diet Order            Diet heart healthy/carb  modified Room service appropriate? Yes; Fluid consistency: Thin  Diet effective now             EDUCATION NEEDS:   No education needs have been identified at this time  Skin:  Skin Assessment: Reviewed RN Assessment  Last BM:  12/29/2018 per chart  Height:   Ht Readings from Last 1 Encounters:  12/26/2018 5\' 11"  (1.803 m)   Weight:   Wt Readings from Last 1 Encounters:  01/12/2019 (!) 143.8 kg   Ideal Body Weight:  78.2 kg  BMI:  Body mass index is 44.21 kg/m.  Estimated Nutritional Needs:   Kcal:  [id number]  Protein:  >/= 145 grams  Fluid:  2.3 L/day  Jacklynn Barnacle, MS, RD, LDN Office: [phone] Pager: [phone] After Hours/Weekend Pager: [phone]

## 2018-12-25 NOTE — Progress Notes (Signed)
Family Update:   Patient gave permission to speak with his wife.   Attempted to contact wife via mobile and home phone numbers listed in the chart. Left a voicemail with name and callback number.

## 2018-12-26 ENCOUNTER — Inpatient Hospital Stay (HOSPITAL_COMMUNITY): Payer: Medicare HMO

## 2018-12-26 DIAGNOSIS — R197 Diarrhea, unspecified: Secondary | ICD-10-CM

## 2018-12-26 LAB — COMPREHENSIVE METABOLIC PANEL
ALT: 56 U/L — ABNORMAL HIGH (ref 0–44)
AST: 49 U/L — ABNORMAL HIGH (ref 15–41)
Albumin: 3.1 g/dL — ABNORMAL LOW (ref 3.5–5.0)
Alkaline Phosphatase: 27 U/L — ABNORMAL LOW (ref 38–126)
Anion gap: 12 (ref 5–15)
BUN: 21 mg/dL (ref 8–23)
CO2: 24 mmol/L (ref 22–32)
Calcium: 8.3 mg/dL — ABNORMAL LOW (ref 8.9–10.3)
Chloride: 99 mmol/L (ref 98–111)
Creatinine, Ser: 0.91 mg/dL (ref 0.61–1.24)
GFR calc Af Amer: >60 mL/min (ref >60)
GFR calc non Af Amer: >60 mL/min (ref >60)
Glucose, Bld: 180 mg/dL — ABNORMAL HIGH (ref 70–99)
Potassium: 4.6 mmol/L (ref 3.5–5.1)
Sodium: 135 mmol/L (ref 135–145)
Total Bilirubin: 0.5 mg/dL (ref 0.3–1.2)
Total Protein: 6.3 g/dL — ABNORMAL LOW (ref 6.5–8.1)

## 2018-12-26 LAB — CBC WITH DIFFERENTIAL/PLATELET
Abs Immature Granulocytes: 0.02 10*3/uL (ref 0.00–0.07)
Basophils Absolute: 0 10*3/uL (ref 0.0–0.1)
Basophils Relative: 0 %
Eosinophils Absolute: 0 10*3/uL (ref 0.0–0.5)
Eosinophils Relative: 0 %
HCT: 46.3 % (ref 39.0–52.0)
Hemoglobin: 15 g/dL (ref 13.0–17.0)
Immature Granulocytes: 1 %
Lymphocytes Relative: 19 %
Lymphs Abs: 0.5 10*3/uL — ABNORMAL LOW (ref 0.7–4.0)
MCH: 31 pg (ref 26.0–34.0)
MCHC: 32.4 g/dL (ref 30.0–36.0)
MCV: 95.7 fL (ref 80.0–100.0)
Monocytes Absolute: 0.2 10*3/uL (ref 0.1–1.0)
Monocytes Relative: 7 %
Neutro Abs: 2.1 10*3/uL (ref 1.7–7.7)
Neutrophils Relative %: 73 %
Platelets: 133 10*3/uL — ABNORMAL LOW (ref 150–400)
RBC: 4.84 MIL/uL (ref 4.22–5.81)
RDW: 12.8 % (ref 11.5–15.5)
WBC: 2.9 10*3/uL — ABNORMAL LOW (ref 4.0–10.5)
nRBC: 0 % (ref 0.0–0.2)

## 2018-12-26 LAB — MAGNESIUM: Magnesium: 2.2 mg/dL (ref 1.7–2.4)

## 2018-12-26 LAB — D-DIMER, QUANTITATIVE: D-Dimer, Quant: 0.47 ug/mL-FEU (ref 0.00–0.50)

## 2018-12-26 LAB — HEPATITIS B SURFACE ANTIBODY, QUANTITATIVE: Hep B S AB Quant (Post): <3.1 m[IU]/mL — ABNORMAL LOW (ref >9.9)

## 2018-12-26 LAB — FERRITIN: Ferritin: 575 ng/mL — ABNORMAL HIGH (ref 24–336)

## 2018-12-26 LAB — C-REACTIVE PROTEIN: CRP: 3 mg/dL — ABNORMAL HIGH (ref <1.0)

## 2018-12-26 LAB — PHOSPHORUS: Phosphorus: 3.2 mg/dL (ref 2.5–4.6)

## 2018-12-26 MED ORDER — SODIUM CHLORIDE 0.9 % IV SOLN
INTRAVENOUS | Status: DC | PRN
  Administered 2018-12-26: 250 mL via INTRAVENOUS

## 2018-12-26 NOTE — Progress Notes (Signed)
Patient was on 7 L HFNC, sats in the 80's respiration rate in 25-30. 02 increased to 12L, 02 sats >92% and respiration rate 20. MD made aware. No new orders at this time. Will continue to monitor.

## 2018-12-26 NOTE — Progress Notes (Signed)
RN went to check on pt after hearing bedside monitor alarming. Pt O2 saturations were at 83% with pt on 12L HFNC. RN increased HFNC to 15L HFNC with pt O2 saturations still being in the mid 80s. RN notified charge RN who brought in non-rebreather and O2 tank to provide additional oxygen for pt along with HFNC. Pt O2 saturations are now at 92%, will continue to monitor and assess pt.

## 2018-12-26 NOTE — Progress Notes (Signed)
Notified MD Vera of pt oxygen requirements. Will continue to monitor and assess pt.

## 2018-12-26 NOTE — Progress Notes (Signed)
PROGRESS NOTE    Arthur Daniel  SLH:[id number] DOB: 14-May-1940 DOA: 12/21/2018 PCP: Arthur Pal, DO   Brief Narrative:  Arthur Daniel is a 78 y.o WM PMHx  A. fib, pacemaker, hyperlipidemia, hypothyroidism   Presents to [facility] ED due to 3-week history of abdominal pain (epigastric) with several episodes of nonbloody diarrhea ( 4-6 daily) with last episode being this morning, as well as increased urinary frequency.  No alleviating/aggravating factor for the abdominal pain and it was associated with shortness of breath when he takes deep breath, patient also complained of nonradiating midsternal chest pain.  He denies  vomiting, he states that he has been in self-isolation at home for the past 3 weeks and denies any sick contacts.   ED Course:  He was noted to be febrile with a temperature of 100.22F, was tachypneic and hypoxic with an O2 sat of 87% dose requiring supplemental oxygen via HFNC at 8lpm.  BP was 137/65.  Work-up in the ED showed thrombocytopenia, hyponatremia, hyperglycemia, elevated transaminitis, urinalysis was normal, procalcitonin was <0.10, LDH was elevated at 177, CRP 3.9, fibrinogen 522, LDH 277, lactic acid 1.2. SARS coronavirus 2 Ag was positive.  Developing infiltrates in the lung bases. He was treated with IV Decadron 6 mg, remdesivir, Actemra and Tylenol.  Patient was transferred to [facility] for further management.   Subjective: 12/10 last 24 hours afebrile, A/O x4, negative abdominal pain, negative N/V.  Positive S OB but no increase from yesterday.   Assessment & Plan:   Principal Problem:   COVID-19 Active Problems:   Hypothyroidism   Essential hypertension   Cardiac pacemaker in situ   BPH (benign prostatic hyperplasia)   Anxiety   Acute respiratory failure with hypoxia (HCC)   Dyslipidemia   Pneumonia due to COVID-19 virus   Gastroenteritis due to COVID-19 virus   Thrombocytopenia (HCC)  Covid pneumonia/acute respiratory failure with  hypoxia COVID-19 Labs  Recent Labs    01/16/2019 1512 12/29/2018 1659 12/25/18 0350 12/26/18 0250  DDIMER 0.55*  --  0.55* 0.47  FERRITIN  --   --  491* 575*  LDH  --  277*  --   --   CRP  --  3.9* 5.0* 3.0*   12/8 SARS coronavirus positive -Decadron 6 mg daily -Remdesivir per pharmacy protocol -Vitamins per Covid protocol -12/9 Actemra x1 dose -Vitamins per Covid protocol -Titrate O2 to maintain SPO2> 88% -Prone patient 16 hours/day; if cannot tolerate prone 2 to 3 hours per shift -12/10 PCXR; slightly worsening aeration  Covid gastroenteritis -Abdominal pain/diarrhea resolved most likely secondary to Covid infection -Continue Zofran as needed  BPH -Patient complained of increased urinary frequency, bladder scan done only showed 40 mL in the bladder with no signs of retention. -Uroxatral 10 mg qhs -12/10 no further acute symptoms  Hyponatremia -Resolved  Elevated transaminitis AST 67, ALT 59 Continue to monitor liver panel with morning labs  Chronic thrombocytopenia -Platelets currently at 99, no obvious sign of bleeding -Continue to monitor platelets troponin labs Results for Arthur Daniel (MRN [id number]) as of 12/26/2018 10:54  Ref. Range 12/31/2018 15:08 12/25/2018 03:50 12/26/2018 02:50  Platelets Latest Ref Range: 150 - 400 K/uL 99 (L) 110 (L) 133 (L)  -Improving  Hypothyroidism -Synthroid 175 mcg daily   Essential hypertension -12/9 patient's BP controlled without metoprolol hold at this time.  If patient's BP begins to climb would then start home medication.  HLD -Zetia 10 mg daily    DVT prophylaxis: Lovenox Code Status:  Full Family Communication:  Disposition Plan: TBD   Consultants:    Procedures/Significant Events:  12/10 PCXR;Persistent bibasilar infiltrates with slight worsening aeration   I have personally reviewed and interpreted all radiology studies and my findings are as above.  VENTILATOR SETTINGS: HFNC 12/10 SPO2  91%    Cultures 12/8 SARS coronavirus positive 12/8 blood LEFT wrist NGTD 12/8 blood RIGHT antecubital NGTD   Antimicrobials: Anti-infectives (From admission, onward)   Start     Dose/Rate Stop   12/25/18 1000  remdesivir 100 mg in sodium chloride 0.9 % 100 mL IVPB  Status:  Discontinued     100 mg 200 mL/hr over 30 Minutes 12/19/2018 2257   12/25/18 1000  remdesivir 100 mg in sodium chloride 0.9 % 100 mL IVPB     100 mg 200 mL/hr over 30 Minutes 12/29/18 0959   01/02/2019 2330  remdesivir 200 mg in sodium chloride 0.9% 250 mL IVPB     200 mg 580 mL/hr over 30 Minutes 12/25/18 0029   12/25/2018 1630  remdesivir 200 mg in sodium chloride 0.9% 250 mL IVPB  Status:  Discontinued     200 mg 580 mL/hr over 30 Minutes 01/07/2019 2257       Devices    LINES / TUBES:      Continuous Infusions: . sodium chloride 250 mL (12/26/18 0912)  . remdesivir 100 mg in NS 100 mL 100 mg (12/26/18 0916)     Objective: Vitals:   12/25/18 1514 12/25/18 1900 12/26/18 0427 12/26/18 0823  BP: 130/67 132/70 121/60 138/69  Pulse:  69 87 80  Resp:  18 (!) 22 20  Temp:  98.8 F (37.1 C) 98.9 F (37.2 C) 98.1 F (36.7 C)  TempSrc: Oral Oral Oral Oral  SpO2:  92% 90% 90%  Weight:      Height:        Intake/Output Summary (Last 24 hours) at 12/26/2018 1051 Last data filed at 12/25/2018 1800 Gross per 24 hour  Intake 102.62 ml  Output -  Net 102.62 ml   Filed Weights   12/21/2018 1438 01/06/2019 1439  Weight: 131.5 kg (!) 143.8 kg   Physical Exam:  General: A/O x4, positive acute respiratory distress Eyes: negative scleral hemorrhage, negative anisocoria, negative icterus ENT: Negative Runny nose, negative gingival bleeding, Neck:  Negative scars, masses, torticollis, lymphadenopathy, JVD Lungs: Diffuse decreased breath sounds without wheezes or crackles Cardiovascular: Regular rate and rhythm without murmur gallop or rub normal S1 and S2 Abdomen: MORBIDLY OBESE, negative abdominal  pain, nondistended, positive soft, bowel sounds, no rebound, no ascites, no appreciable mass Extremities: No significant cyanosis, clubbing, or edema bilateral lower extremities Skin: Negative rashes, lesions, ulcers Psychiatric:  Negative depression, negative anxiety, negative fatigue, negative mania  Central nervous system:  Cranial nerves II through XII intact, tongue/uvula midline, all extremities muscle strength 5/5, sensation intact throughout, negative dysarthria, negative expressive aphasia, negative receptive aphasia.  .     Data Reviewed: Care during the described time interval was provided by me .  I have reviewed this patient's available data, including medical history, events of note, physical examination, and all test results as part of my evaluation.   CBC: Recent Labs  Lab 12/27/2018 1508 12/25/18 0350 12/26/18 0250  WBC 4.4 3.5* 2.9*  NEUTROABS 3.4 2.7 2.1  HGB 15.2 14.8 15.0  HCT 45.7 45.1 46.3  MCV 95.2 95.6 95.7  PLT 99* 110* 024*   Basic Metabolic Panel: Recent Labs  Lab 01/05/2019 1508 12/25/18 0350 12/26/18  0250  NA 130* 135 135  K 3.9 4.2 4.6  CL 98 98 99  CO2 _0 GLUCOSE 158* 175* 180*  BUN _1 CREATININE 1.13 0.91 0.91  CALCIUM 8.4* 8.2* 8.3*  MG  --  2.3 2.2  PHOS  --  3.0 3.2   GFR: Estimated Creatinine Clearance: 100.3 mL/min (by C-G formula based on SCr of 0.91 mg/dL). Liver Function Tests: Recent Labs  Lab 01/09/2019 1508 12/25/18 0350 12/26/18 0250  AST 67* 47* 49*  ALT 59* 51* 56*  ALKPHOS 31* 29* 27*  BILITOT 0.6 0.7 0.5  PROT 6.6 6.5 6.3*  ALBUMIN 3.6 3.3* 3.1*   Recent Labs  Lab 12/29/2018 1508  LIPASE 31   No results for input(s): AMMONIA in the last 168 hours. Coagulation Profile: No results for input(s): INR, PROTIME in the last 168 hours. Cardiac Enzymes: No results for input(s): CKTOTAL, CKMB, CKMBINDEX, TROPONINI in the last 168 hours. BNP (last 3 results) No results for input(s): PROBNP in the last 8760  hours. HbA1C: No results for input(s): HGBA1C in the last 72 hours. CBG: No results for input(s): GLUCAP in the last 168 hours. Lipid Profile: No results for input(s): CHOL, HDL, LDLCALC, TRIG, CHOLHDL, LDLDIRECT in the last 72 hours. Thyroid Function Tests: No results for input(s): TSH, T4TOTAL, FREET4, T3FREE, THYROIDAB in the last 72 hours. Anemia Panel: Recent Labs    12/25/18 0350 12/26/18 0250  FERRITIN 491* 575*   Urine analysis:    Component Value Date/Time   COLORURINE YELLOW 12/27/2018 1900   APPEARANCEUR CLEAR 12/30/2018 1900   LABSPEC 1.015 01/14/2019 1900   PHURINE 6.0 01/11/2019 1900   GLUCOSEU NEGATIVE 01/09/2019 1900   HGBUR NEGATIVE 12/17/2018 1900   BILIRUBINUR NEGATIVE 12/22/2018 1900   KETONESUR NEGATIVE 01/10/2019 1900   PROTEINUR 30 (A) 01/05/2019 1900   UROBILINOGEN 1.0 10/03/2006 0054   NITRITE NEGATIVE 12/28/2018 1900   LEUKOCYTESUR NEGATIVE 01/14/2019 1900   Sepsis Labs: _2 (procalcitonin:4,lacticidven:4)  ) Recent Results (from the past 240 hour(s))  Blood culture (routine x 2)     Status: None (Preliminary result)   Collection Time: 01/05/2019  3:09 PM   Specimen: BLOOD  Result Value Ref Range Status   Specimen Description   Final    BLOOD BLOOD LEFT WRIST Performed at [facility], [address] [place], [place] [address]    Special Requests   Final    BOTTLES DRAWN AEROBIC ONLY Blood Culture adequate volume Performed at [facility], [address] [place], [place] [address]    Culture   Final    NO GROWTH 2 DAYS Performed at [facility], [place] [address], [place], [place] [address]    Report Status PENDING  Incomplete  Blood culture (routine x 2)     Status: None (Preliminary result)   Collection Time: 01/01/2019  3:09 PM   Specimen: BLOOD  Result Value Ref Range Status   Specimen Description   Final    BLOOD RIGHT ANTECUBITAL Performed at [facility], [address] [place], [place] [address]    Special Requests   Final    BOTTLES DRAWN AEROBIC AND ANAEROBIC Blood Culture adequate volume Performed at [facility], [address] [place], [place] [address]    Culture   Final    NO GROWTH 2 DAYS Performed at [facility], [place] [address] [place], [place] [address]  Report Status PENDING  Incomplete  SARS Coronavirus 2 Ag (30 min TAT) - Nasal Swab (BD Veritor Kit)     Status: Abnormal   Collection Time: 01/12/2019  3:16 PM   Specimen: Nasal Swab (BD Veritor Kit)  Result Value Ref Range Status   SARS Coronavirus 2 Ag POSITIVE (A) NEGATIVE Final    Comment: RESULT CALLED TO, READ BACK BY AND VERIFIED WITH: MARVA SIMMS RN AT 6301 ON 12/26/2018 BY [email or web address] (NOTE) SARS-CoV-2 antigen PRESENT. Positive results indicate the presence of viral antigens, but clinical correlation with patient history and other diagnostic information is necessary to determine patient infection status.  Positive results do not rule out bacterial infection or co-infection  with other viruses. False positive results are rare but can occur, and confirmatory RT-PCR testing may be appropriate in some circumstances. The expected result is Negative. Fact Sheet for Patients: PodPark.tn Fact Sheet for Providers: GiftContent.is  This test is not yet approved or cleared by the [place] FDA and  has been authorized for detection and/or diagnosis of SARS-CoV-2 by FDA under an Emergency Use Authorization (EUA).  This EUA will remain in effect (meaning this test can be used) for the duration of  the COVID-1 9 declaration under Section 564(b)(1) of the Act, 21 U.S.C. section 360bbb-3(b)(1), unless the authorization is terminated or revoked sooner. Performed at [facility], [address] [place], [place] [address]          Radiology Studies: DG Chest Portable 1 View  Result Date: 01/16/2019 CLINICAL  DATA:  Pt states that he has been having urinary frequency and diarrhea for a couple of weeks. Denies fever, denies blood in urine or stool. EXAM: PORTABLE CHEST 1 VIEW COMPARISON:  03/31/2018 FINDINGS: LEFT-sided transvenous pacemaker leads overlie the RIGHT atrium and RIGHT ventricle. Shallow lung inflation accentuate bronchovascular markings. However there is increased opacity in the lung bases, LEFT greater than RIGHT, consistent with developing infiltrates. No pulmonary edema. IMPRESSION: 1. Shallow inflation. 2. Developing infiltrates in the lung bases. Electronically Signed   By: Nolon Nations M.D.   On: 12/28/2018 15:30        Scheduled Meds: . alfuzosin  10 mg Oral QHS  . dexamethasone (DECADRON) injection  6 mg Intravenous Q24H  . dextromethorphan-guaiFENesin  1 tablet Oral BID  . enoxaparin (LOVENOX) injection  70 mg Subcutaneous Q24H  . ezetimibe  10 mg Oral Daily  . feeding supplement (ENSURE ENLIVE)  237 mL Oral BID BM  . levothyroxine  175 mcg Oral QAC breakfast  . loratadine  10 mg Oral Daily  . mirabegron ER  50 mg Oral Daily  . vitamin C  500 mg Oral Daily  . zinc sulfate  220 mg Oral Daily   Continuous Infusions: . sodium chloride 250 mL (12/26/18 0912)  . remdesivir 100 mg in NS 100 mL 100 mg (12/26/18 0916)     LOS: 2 days   The patient is critically ill with multiple organ systems failure and requires high complexity decision making for assessment and support, frequent evaluation and titration of therapies, application of advanced monitoring technologies and extensive interpretation of multiple databases. Critical Care Time devoted to patient care services described in this note  Time spent: 40 minutes     , Geraldo Docker, MD [facility] Pager [phone]  If 7PM-7AM, please contact night-coverage [email or web address] Password [email or web address] 12/26/2018, 10:51 AM

## 2018-12-26 NOTE — Progress Notes (Signed)
Patient remained alert and oriented x 4 all shift. No acute respiratory or cardiac episodes this shift. Although asymptomatic, the patient required more oxygen through the high flow [place] to keep oxygen sats at 90% and above. Patient currently at 12 LPM high flow, an increase from 7 lpm at the beginning of shift. No c/o pain this shift. No falls or injuries this shift. Patient currently lying in bed watchibg TV. No distress seen. Call bell within reach. Will continue to monitor.

## 2018-12-26 NOTE — Progress Notes (Signed)
Patients' wife and son updated on his current condition and treatment plan.

## 2018-12-27 ENCOUNTER — Inpatient Hospital Stay (HOSPITAL_COMMUNITY): Payer: Medicare HMO

## 2018-12-27 LAB — CBC WITH DIFFERENTIAL/PLATELET
Abs Immature Granulocytes: 0.03 10*3/uL (ref 0.00–0.07)
Basophils Absolute: 0 10*3/uL (ref 0.0–0.1)
Basophils Relative: 0 %
Eosinophils Absolute: 0 10*3/uL (ref 0.0–0.5)
Eosinophils Relative: 0 %
HCT: 46.4 % (ref 39.0–52.0)
Hemoglobin: 15.3 g/dL (ref 13.0–17.0)
Immature Granulocytes: 1 %
Lymphocytes Relative: 17 %
Lymphs Abs: 0.6 10*3/uL — ABNORMAL LOW (ref 0.7–4.0)
MCH: 31.4 pg (ref 26.0–34.0)
MCHC: 33 g/dL (ref 30.0–36.0)
MCV: 95.3 fL (ref 80.0–100.0)
Monocytes Absolute: 0.3 10*3/uL (ref 0.1–1.0)
Monocytes Relative: 9 %
Neutro Abs: 2.7 10*3/uL (ref 1.7–7.7)
Neutrophils Relative %: 73 %
Platelets: 159 10*3/uL (ref 150–400)
RBC: 4.87 MIL/uL (ref 4.22–5.81)
RDW: 12.8 % (ref 11.5–15.5)
WBC: 3.7 10*3/uL — ABNORMAL LOW (ref 4.0–10.5)
nRBC: 0 % (ref 0.0–0.2)

## 2018-12-27 LAB — COMPREHENSIVE METABOLIC PANEL
ALT: 89 U/L — ABNORMAL HIGH (ref 0–44)
AST: 99 U/L — ABNORMAL HIGH (ref 15–41)
Albumin: 3.3 g/dL — ABNORMAL LOW (ref 3.5–5.0)
Alkaline Phosphatase: 27 U/L — ABNORMAL LOW (ref 38–126)
Anion gap: 12 (ref 5–15)
BUN: 20 mg/dL (ref 8–23)
CO2: 25 mmol/L (ref 22–32)
Calcium: 8.3 mg/dL — ABNORMAL LOW (ref 8.9–10.3)
Chloride: 99 mmol/L (ref 98–111)
Creatinine, Ser: 0.94 mg/dL (ref 0.61–1.24)
GFR calc Af Amer: >60 mL/min (ref >60)
GFR calc non Af Amer: >60 mL/min (ref >60)
Glucose, Bld: 170 mg/dL — ABNORMAL HIGH (ref 70–99)
Potassium: 4.6 mmol/L (ref 3.5–5.1)
Sodium: 136 mmol/L (ref 135–145)
Total Bilirubin: 0.8 mg/dL (ref 0.3–1.2)
Total Protein: 6.1 g/dL — ABNORMAL LOW (ref 6.5–8.1)

## 2018-12-27 LAB — FERRITIN: Ferritin: 724 ng/mL — ABNORMAL HIGH (ref 24–336)

## 2018-12-27 LAB — PHOSPHORUS: Phosphorus: 3 mg/dL (ref 2.5–4.6)

## 2018-12-27 LAB — MAGNESIUM: Magnesium: 2.3 mg/dL (ref 1.7–2.4)

## 2018-12-27 LAB — D-DIMER, QUANTITATIVE: D-Dimer, Quant: 0.42 ug/mL-FEU (ref 0.00–0.50)

## 2018-12-27 LAB — C-REACTIVE PROTEIN: CRP: 1.2 mg/dL — ABNORMAL HIGH (ref <1.0)

## 2018-12-27 MED ORDER — FUROSEMIDE 10 MG/ML IJ SOLN
60.0000 mg | Freq: Two times a day (BID) | INTRAMUSCULAR | Status: AC
  Administered 2018-12-27 (×2): 60 mg via INTRAVENOUS
  Filled 2018-12-27 (×2): qty 6

## 2018-12-27 NOTE — Progress Notes (Signed)
PROGRESS NOTE    Arthur Daniel  ERD:[id number] DOB: [birth year] DOA: 01/05/2019 PCP: Shelda Pal, DO   Brief Narrative:  KOUPER Daniel is a 78 y.o WM PMHx  A. fib, pacemaker, hyperlipidemia, hypothyroidism   Presents to [facility] ED due to 3-week history of abdominal pain (epigastric) with several episodes of nonbloody diarrhea ( 4-6 daily) with last episode being this morning, as well as increased urinary frequency.  No alleviating/aggravating factor for the abdominal pain and it was associated with shortness of breath when he takes deep breath, patient also complained of nonradiating midsternal chest pain.  He denies  vomiting, he states that he has been in self-isolation at home for the past 3 weeks and denies any sick contacts.   ED Course:  He was noted to be febrile with a temperature of 100.50F, was tachypneic and hypoxic with an O2 sat of 87% dose requiring supplemental oxygen via HFNC at 8lpm.  BP was 137/65.  Work-up in the ED showed thrombocytopenia, hyponatremia, hyperglycemia, elevated transaminitis, urinalysis was normal, procalcitonin was <0.10, LDH was elevated at 177, CRP 3.9, fibrinogen 522, LDH 277, lactic acid 1.2. SARS coronavirus 2 Ag was positive.  Developing infiltrates in the lung bases. He was treated with IV Decadron 6 mg, remdesivir, Actemra and Tylenol.  Patient was transferred to [facility] for further management.   Subjective: 12/11 last 24 hours afebrile A/O x4, negative abdominal pain.  Negative N/V.  Positive S OB, states has had a rough day.  Currently proning/laying on left side.    Assessment & Plan:   Principal Problem:   COVID-19 Active Problems:   Hypothyroidism   Essential hypertension   Cardiac pacemaker in situ   BPH (benign prostatic hyperplasia)   Anxiety   Acute respiratory failure with hypoxia (HCC)   Dyslipidemia   Pneumonia due to COVID-19 virus   Gastroenteritis due to COVID-19 virus   Thrombocytopenia (HCC)   Covid  pneumonia/acute respiratory failure with hypoxia COVID-19 Labs  Recent Labs    01/08/2019 1659 12/25/18 0350 12/26/18 0250 12/27/18 0114  DDIMER  --  0.55* 0.47 0.42  FERRITIN  --  491* 575* 724*  LDH 277*  --   --   --   CRP 3.9* 5.0* 3.0* 1.2*   12/8 SARS coronavirus positive -Decadron 6 mg daily -Remdesivir per pharmacy protocol -Vitamins per Covid protocol -12/9 Actemra x1 dose -Vitamins per Covid protocol -Titrate O2 to maintain SPO2> 88% -Prone patient 16 hours/day; if cannot tolerate prone 2 to 3 hours per shift -12/10 PCXR; slightly worsening aeration -12/11 PCXR pending -Lasix IV 60 mg twice daily x2 doses  Covid gastroenteritis -Abdominal pain/diarrhea resolved most likely secondary to Covid infection -Continue Zofran as needed  BPH -Patient complained of increased urinary frequency, bladder scan done only showed 40 mL in the bladder with no signs of retention. -Uroxatral 10 mg qhs -12/10 no further acute symptoms  Hyponatremia -Resolved  Elevated transaminitis AST 67, ALT 59 Continue to monitor liver panel with morning labs  Chronic thrombocytopenia -Platelets currently at 99, no obvious sign of bleeding -Continue to monitor platelets troponin labs Results for CONLEE, SLITER (MRN [id number]) as of 12/26/2018 10:54  Ref. Range 01/06/2019 15:08 12/25/2018 03:50 12/26/2018 02:50  Platelets Latest Ref Range: 150 - 400 K/uL 99 (L) 110 (L) 133 (L)  -Improving  Hypothyroidism -Synthroid 175 mcg daily   Essential hypertension -12/9 patient's BP controlled without metoprolol hold at this time.  If patient's BP begins to climb would then  start home medication. -Strict in and out -1.1 L -Daily weight   HLD -Zetia 10 mg daily    DVT prophylaxis: Lovenox Code Status: Full Family Communication:  Disposition Plan: TBD   Consultants:    Procedures/Significant Events:  12/10 PCXR;Persistent bibasilar infiltrates with slight worsening  aeration   I have personally reviewed and interpreted all radiology studies and my findings are as above.  VENTILATOR SETTINGS: HFNC 12/11 Flow; 15 L/min SPO2 88%    Cultures 12/8 SARS coronavirus positive 12/8 blood LEFT wrist NGTD 12/8 blood RIGHT antecubital NGTD   Antimicrobials: Anti-infectives (From admission, onward)   Start     Dose/Rate Stop   12/25/18 1000  remdesivir 100 mg in sodium chloride 0.9 % 100 mL IVPB  Status:  Discontinued     100 mg 200 mL/hr over 30 Minutes 12/28/2018 2257   12/25/18 1000  remdesivir 100 mg in sodium chloride 0.9 % 100 mL IVPB     100 mg 200 mL/hr over 30 Minutes 12/29/18 0959   01/02/2019 2330  remdesivir 200 mg in sodium chloride 0.9% 250 mL IVPB     200 mg 580 mL/hr over 30 Minutes 12/25/18 0029   12/19/2018 1630  remdesivir 200 mg in sodium chloride 0.9% 250 mL IVPB  Status:  Discontinued     200 mg 580 mL/hr over 30 Minutes 12/23/2018 2257       Devices    LINES / TUBES:      Continuous Infusions: . sodium chloride 250 mL (12/26/18 0912)  . remdesivir 100 mg in NS 100 mL 100 mg (12/26/18 0916)     Objective: Vitals:   12/27/18 0054 12/27/18 0226 12/27/18 0417 12/27/18 0743  BP: 133/66  (!) 124/57 138/72  Pulse: 88 62 74   Resp: (!) 27 18 (!) 23   Temp: 98.5 F (36.9 C)  97.9 F (36.6 C) 97.8 F (36.6 C)  TempSrc: Oral  Oral Oral  SpO2: 90% 93% 91%   Weight:      Height:        Intake/Output Summary (Last 24 hours) at 12/27/2018 0824 Last data filed at 12/27/2018 0400 Gross per 24 hour  Intake 644.74 ml  Output 850 ml  Net -205.26 ml   Filed Weights   12/23/2018 1438 01/16/2019 1439  Weight: 131.5 kg (!) 143.8 kg   Physical Exam:  General: A/O x4, positive acute respiratory distress Eyes: negative scleral hemorrhage, negative anisocoria, negative icterus ENT: Negative Runny nose, negative gingival bleeding, Neck:  Negative scars, masses, torticollis, lymphadenopathy, JVD Lungs: Tachypneic clear to  auscultation bilaterally without wheezes or crackles (per patient having a rough day) Cardiovascular: Regular rate and rhythm (pacer left chest wall does not appear to be paced) without murmur gallop or rub normal S1 and S2 Abdomen: MORBIDLY OBESE negative abdominal pain, nondistended, positive soft, bowel sounds, no rebound, no ascites, no appreciable mass Extremities: No significant cyanosis, clubbing, or edema bilateral lower extremities Skin: Negative rashes, lesions, ulcers Psychiatric:  Negative depression, negative anxiety, negative fatigue, negative mania  Central nervous system:  Cranial nerves II through XII intact, tongue/uvula midline, all extremities muscle strength 5/5, sensation intact throughout, negative dysarthria, negative expressive aphasia, negative receptive aphasia.  .     Data Reviewed: Care during the described time interval was provided by me .  I have reviewed this patient's available data, including medical history, events of note, physical examination, and all test results as part of my evaluation.   CBC: Recent Labs  Lab 01/14/2019  1508 12/25/18 0350 12/26/18 0250 12/27/18 0114  WBC 4.4 3.5* 2.9* 3.7*  NEUTROABS 3.4 2.7 2.1 2.7  HGB 15.2 14.8 15.0 15.3  HCT 45.7 45.1 46.3 46.4  MCV 95.2 95.6 95.7 95.3  PLT 99* 110* 133* 468   Basic Metabolic Panel: Recent Labs  Lab 12/23/2018 1508 12/25/18 0350 12/26/18 0250 12/27/18 0114  NA 130* 135 135 136  K 3.9 4.2 4.6 4.6  CL 98 98 99 99  CO2 '23 25 24 25  ' GLUCOSE 158* 175* 180* 170*  BUN '14 12 21 20  ' CREATININE 1.13 0.91 0.91 0.94  CALCIUM 8.4* 8.2* 8.3* 8.3*  MG  --  2.3 2.2 2.3  PHOS  --  3.0 3.2 3.0   GFR: Estimated Creatinine Clearance: 97.1 mL/min (by C-G formula based on SCr of 0.94 mg/dL). Liver Function Tests: Recent Labs  Lab 01/01/2019 1508 12/25/18 0350 12/26/18 0250 12/27/18 0114  AST 67* 47* 49* 99*  ALT 59* 51* 56* 89*  ALKPHOS 31* 29* 27* 27*  BILITOT 0.6 0.7 0.5 0.8  PROT 6.6 6.5  6.3* 6.1*  ALBUMIN 3.6 3.3* 3.1* 3.3*   Recent Labs  Lab 01/15/2019 1508  LIPASE 31   No results for input(s): AMMONIA in the last 168 hours. Coagulation Profile: No results for input(s): INR, PROTIME in the last 168 hours. Cardiac Enzymes: No results for input(s): CKTOTAL, CKMB, CKMBINDEX, TROPONINI in the last 168 hours. BNP (last 3 results) No results for input(s): PROBNP in the last 8760 hours. HbA1C: No results for input(s): HGBA1C in the last 72 hours. CBG: No results for input(s): GLUCAP in the last 168 hours. Lipid Profile: No results for input(s): CHOL, HDL, LDLCALC, TRIG, CHOLHDL, LDLDIRECT in the last 72 hours. Thyroid Function Tests: No results for input(s): TSH, T4TOTAL, FREET4, T3FREE, THYROIDAB in the last 72 hours. Anemia Panel: Recent Labs    12/26/18 0250 12/27/18 0114  FERRITIN 575* 724*   Urine analysis:    Component Value Date/Time   COLORURINE YELLOW 01/01/2019 1900   APPEARANCEUR CLEAR 12/28/2018 1900   LABSPEC 1.015 01/12/2019 1900   PHURINE 6.0 01/02/2019 1900   GLUCOSEU NEGATIVE 12/19/2018 1900   HGBUR NEGATIVE 01/06/2019 1900   BILIRUBINUR NEGATIVE 12/23/2018 1900   KETONESUR NEGATIVE 01/09/2019 1900   PROTEINUR 30 (A) 12/29/2018 1900   UROBILINOGEN 1.0 10/03/2006 0054   NITRITE NEGATIVE 01/06/2019 1900   LEUKOCYTESUR NEGATIVE 12/29/2018 1900   Sepsis Labs: '@LABRCNTIP' (procalcitonin:4,lacticidven:4)  ) Recent Results (from the past 240 hour(s))  Blood culture (routine x 2)     Status: None (Preliminary result)   Collection Time: 01/04/2019  3:09 PM   Specimen: BLOOD  Result Value Ref Range Status   Specimen Description   Final    BLOOD BLOOD LEFT WRIST Performed at [facility], [address] [place], [place] [address]    Special Requests   Final    BOTTLES DRAWN AEROBIC ONLY Blood Culture adequate volume Performed at [facility], [address] [place], [place] [address]    Culture   Final    NO GROWTH 2  DAYS Performed at [facility], [place] [address] [place], [place] [address]    Report Status PENDING  Incomplete  Blood culture (routine x 2)     Status: None (Preliminary result)   Collection Time: 01/06/2019  3:09 PM   Specimen: BLOOD  Result Value Ref Range Status   Specimen Description   Final    BLOOD RIGHT ANTECUBITAL  Performed at [facility], [address] [place], [place] [address]    Special Requests   Final    BOTTLES DRAWN AEROBIC AND ANAEROBIC Blood Culture adequate volume Performed at [facility], [address] [place], [place] [address]    Culture   Final    NO GROWTH 2 DAYS Performed at [facility], [place] [address], [place], [place] [address]    Report Status PENDING  Incomplete  SARS Coronavirus 2 Ag (30 min TAT) - Nasal Swab (BD Veritor Kit)     Status: Abnormal   Collection Time: 01/11/2019  3:16 PM   Specimen: Nasal Swab (BD Veritor Kit)  Result Value Ref Range Status   SARS Coronavirus 2 Ag POSITIVE (A) NEGATIVE Final    Comment: RESULT CALLED TO, READ BACK BY AND VERIFIED WITH: MARVA SIMMS RN AT 3875 ON 01/14/2019 BY [email or web address] (NOTE) SARS-CoV-2 antigen PRESENT. Positive results indicate the presence of viral antigens, but clinical correlation with patient history and other diagnostic information is necessary to determine patient infection status.  Positive results do not rule out bacterial infection or co-infection  with other viruses. False positive results are rare but can occur, and confirmatory RT-PCR testing may be appropriate in some circumstances. The expected result is Negative. Fact Sheet for Patients: PodPark.tn Fact Sheet for Providers: GiftContent.is  This test is not yet approved or cleared by the [place] FDA and  has been authorized for detection and/or diagnosis of SARS-CoV-2 by FDA under an Emergency Use Authorization (EUA).  This EUA will remain  in effect (meaning this test can be used) for the duration of  the COVID-1 9 declaration under Section 564(b)(1) of the Act, 21 U.S.C. section 360bbb-3(b)(1), unless the authorization is terminated or revoked sooner. Performed at [facility], [address], [place], [place] [address]          Radiology Studies: DG CHEST PORT 1 VIEW  Result Date: 12/26/2018 CLINICAL DATA:  Shortness of breath. EXAM: PORTABLE CHEST 1 VIEW COMPARISON:  12/23/2018 FINDINGS: The heart is upper limits of normal in size given the AP projection and portable technique. Stable tortuosity and calcification of the thoracic aorta. Persistent patchy lower lobe infiltrates with slight worsening basilar aeration bilaterally. No definite pleural effusions or pulmonary edema. IMPRESSION: Persistent bibasilar infiltrates with slight worsening aeration. Electronically Signed   By: Marijo Sanes M.D.   On: 12/26/2018 11:43        Scheduled Meds: . alfuzosin  10 mg Oral QHS  . dexamethasone (DECADRON) injection  6 mg Intravenous Q24H  . dextromethorphan-guaiFENesin  1 tablet Oral BID  . enoxaparin (LOVENOX) injection  70 mg Subcutaneous Q24H  . ezetimibe  10 mg Oral Daily  . feeding supplement (ENSURE ENLIVE)  237 mL Oral BID BM  . levothyroxine  175 mcg Oral QAC breakfast  . loratadine  10 mg Oral Daily  . mirabegron ER  50 mg Oral Daily  . vitamin C  500 mg Oral Daily  . zinc sulfate  220 mg Oral Daily   Continuous Infusions: . sodium chloride 250 mL (12/26/18 0912)  . remdesivir 100 mg in NS 100 mL 100 mg (12/26/18 0916)     LOS: 3 days   The patient is critically ill with multiple organ systems failure and requires high complexity decision making for assessment and support, frequent evaluation and titration of therapies, application of advanced monitoring technologies and extensive interpretation of multiple databases. Critical  Care Time devoted to patient care services described in this note   Time spent: 40 minutes     Ladarrian Asencio, Geraldo Docker, MD [facility] Pager [phone]  If 7PM-7AM, please contact night-coverage [email or web address] Password [email or web address] 12/27/2018, 8:24 AM

## 2018-12-27 NOTE — Progress Notes (Signed)
Notified wife of patient condition, O2 requirement and treatment plan.

## 2018-12-28 LAB — CBC WITH DIFFERENTIAL/PLATELET
Abs Immature Granulocytes: 0.04 10*3/uL (ref 0.00–0.07)
Basophils Absolute: 0 10*3/uL (ref 0.0–0.1)
Basophils Relative: 0 %
Eosinophils Absolute: 0 10*3/uL (ref 0.0–0.5)
Eosinophils Relative: 0 %
HCT: 47.6 % (ref 39.0–52.0)
Hemoglobin: 15.9 g/dL (ref 13.0–17.0)
Immature Granulocytes: 1 %
Lymphocytes Relative: 14 %
Lymphs Abs: 0.5 10*3/uL — ABNORMAL LOW (ref 0.7–4.0)
MCH: 31.4 pg (ref 26.0–34.0)
MCHC: 33.4 g/dL (ref 30.0–36.0)
MCV: 94.1 fL (ref 80.0–100.0)
Monocytes Absolute: 0.3 10*3/uL (ref 0.1–1.0)
Monocytes Relative: 9 %
Neutro Abs: 2.6 10*3/uL (ref 1.7–7.7)
Neutrophils Relative %: 76 %
Platelets: 172 10*3/uL (ref 150–400)
RBC: 5.06 MIL/uL (ref 4.22–5.81)
RDW: 12.8 % (ref 11.5–15.5)
WBC: 3.5 10*3/uL — ABNORMAL LOW (ref 4.0–10.5)
nRBC: 0 % (ref 0.0–0.2)

## 2018-12-28 LAB — COMPREHENSIVE METABOLIC PANEL
ALT: 150 U/L — ABNORMAL HIGH (ref 0–44)
AST: 127 U/L — ABNORMAL HIGH (ref 15–41)
Albumin: 3.4 g/dL — ABNORMAL LOW (ref 3.5–5.0)
Alkaline Phosphatase: 32 U/L — ABNORMAL LOW (ref 38–126)
Anion gap: 14 (ref 5–15)
BUN: 22 mg/dL (ref 8–23)
CO2: 25 mmol/L (ref 22–32)
Calcium: 8.2 mg/dL — ABNORMAL LOW (ref 8.9–10.3)
Chloride: 97 mmol/L — ABNORMAL LOW (ref 98–111)
Creatinine, Ser: 0.88 mg/dL (ref 0.61–1.24)
GFR calc Af Amer: >60 mL/min (ref >60)
GFR calc non Af Amer: >60 mL/min (ref >60)
Glucose, Bld: 185 mg/dL — ABNORMAL HIGH (ref 70–99)
Potassium: 3.8 mmol/L (ref 3.5–5.1)
Sodium: 136 mmol/L (ref 135–145)
Total Bilirubin: 0.7 mg/dL (ref 0.3–1.2)
Total Protein: 6.4 g/dL — ABNORMAL LOW (ref 6.5–8.1)

## 2018-12-28 LAB — FERRITIN: Ferritin: 761 ng/mL — ABNORMAL HIGH (ref 24–336)

## 2018-12-28 LAB — PHOSPHORUS: Phosphorus: 3.2 mg/dL (ref 2.5–4.6)

## 2018-12-28 LAB — MAGNESIUM: Magnesium: 2.1 mg/dL (ref 1.7–2.4)

## 2018-12-28 LAB — D-DIMER, QUANTITATIVE: D-Dimer, Quant: 0.51 ug/mL-FEU — ABNORMAL HIGH (ref 0.00–0.50)

## 2018-12-28 LAB — C-REACTIVE PROTEIN: CRP: 0.7 mg/dL (ref <1.0)

## 2018-12-28 MED ORDER — FUROSEMIDE 10 MG/ML IJ SOLN
60.0000 mg | Freq: Two times a day (BID) | INTRAMUSCULAR | Status: AC
  Administered 2018-12-28 – 2018-12-29 (×2): 60 mg via INTRAVENOUS
  Filled 2018-12-28 (×2): qty 6

## 2018-12-28 NOTE — Progress Notes (Signed)
PROGRESS NOTE    Arthur Daniel  XEN:[id number] DOB: [birth year] DOA: 01/06/2019 PCP: Shelda Pal, DO   Brief Narrative:  Arthur Daniel is a 78 y.o WM PMHx  A. fib, pacemaker, hyperlipidemia, hypothyroidism   Presents to [facility] ED due to 3-week history of abdominal pain (epigastric) with several episodes of nonbloody diarrhea ( 4-6 daily) with last episode being this morning, as well as increased urinary frequency.  No alleviating/aggravating factor for the abdominal pain and it was associated with shortness of breath when he takes deep breath, patient also complained of nonradiating midsternal chest pain.  He denies  vomiting, he states that he has been in self-isolation at home for the past 3 weeks and denies any sick contacts.   ED Course:  He was noted to be febrile with a temperature of 100.35F, was tachypneic and hypoxic with an O2 sat of 87% dose requiring supplemental oxygen via HFNC at 8lpm.  BP was 137/65.  Work-up in the ED showed thrombocytopenia, hyponatremia, hyperglycemia, elevated transaminitis, urinalysis was normal, procalcitonin was <0.10, LDH was elevated at 177, CRP 3.9, fibrinogen 522, LDH 277, lactic acid 1.2. SARS coronavirus 2 Ag was positive.  Developing infiltrates in the lung bases. He was treated with IV Decadron 6 mg, remdesivir, Actemra and Tylenol.  Patient was transferred to [facility] for further management.   Subjective: 12/12 afebrile overnight negative N/V, positive S OB but improved.    Assessment & Plan:   Principal Problem:   COVID-19 Active Problems:   Hypothyroidism   Essential hypertension   Cardiac pacemaker in situ   BPH (benign prostatic hyperplasia)   Anxiety   Acute respiratory failure with hypoxia (HCC)   Dyslipidemia   Pneumonia due to COVID-19 virus   Gastroenteritis due to COVID-19 virus   Thrombocytopenia (HCC)   Covid pneumonia/acute respiratory failure with hypoxia COVID-19 Labs  Recent Labs    12/26/18 0250  12/27/18 0114 12/28/18 0149  DDIMER 0.47 0.42 0.51*  FERRITIN 575* 724* 761*  CRP 3.0* 1.2* 0.7   12/8 SARS coronavirus positive -Decadron 6 mg daily -Remdesivir per pharmacy protocol -Vitamins per Covid protocol -12/9 Actemra x1 dose -Vitamins per Covid protocol -Titrate O2 to maintain SPO2> 88% -Prone patient 16 hours/day; if cannot tolerate prone 2 to 3 hours per shift -12/10 PCXR; slightly worsening aeration -12/11 PCXR; infiltrates or atelectasis see results below -12/12 Lasix IV 60 mg twice daily x2 doses  Covid gastroenteritis -Abdominal pain/diarrhea resolved most likely secondary to Covid infection -Continue Zofran as needed  BPH -Patient complained of increased urinary frequency, bladder scan done only showed 40 mL in the bladder with no signs of retention. -Uroxatral 10 mg qhs -12/10 no further acute symptoms  Hyponatremia -Resolved  Elevated transaminitis AST 67, ALT 59 Continue to monitor liver panel with morning labs  Chronic thrombocytopenia -Platelets currently at 99, no obvious sign of bleeding -Continue to monitor platelets troponin labs Results for DARAN, FAVARO (MRN [id number]) as of 12/26/2018 10:54  Ref. Range 12/30/2018 15:08 12/25/2018 03:50 12/26/2018 02:50  Platelets Latest Ref Range: 150 - 400 K/uL 99 (L) 110 (L) 133 (L)  -Improving  Hypothyroidism -Synthroid 175 mcg daily   Essential hypertension -12/9 patient's BP controlled without metoprolol hold at this time.  If patient's BP begins to climb would then start home medication. -Strict in and out -1.4 L -Daily weight   HLD -Zetia 10 mg daily    DVT prophylaxis: Lovenox Code Status: Full Family Communication:  Disposition Plan: TBD  Consultants:    Procedures/Significant Events:  12/10 PCXR;Persistent bibasilar infiltrates with slight worsening aeration 12/11 PCXR stable bibasilar atelectasis or infiltrates    I have personally reviewed and interpreted all  radiology studies and my findings are as above.  VENTILATOR SETTINGS: HFNC 12/12 Flow; 15 L/min SPO2 96%    Cultures 12/8 SARS coronavirus positive 12/8 blood LEFT wrist NGTD 12/8 blood RIGHT antecubital NGTD   Antimicrobials: Anti-infectives (From admission, onward)   Start     Dose/Rate Stop   12/25/18 1000  remdesivir 100 mg in sodium chloride 0.9 % 100 mL IVPB  Status:  Discontinued     100 mg 200 mL/hr over 30 Minutes 01/15/2019 2257   12/25/18 1000  remdesivir 100 mg in sodium chloride 0.9 % 100 mL IVPB     100 mg 200 mL/hr over 30 Minutes 12/29/18 0959   12/21/2018 2330  remdesivir 200 mg in sodium chloride 0.9% 250 mL IVPB     200 mg 580 mL/hr over 30 Minutes 12/25/18 0029   12/23/2018 1630  remdesivir 200 mg in sodium chloride 0.9% 250 mL IVPB  Status:  Discontinued     200 mg 580 mL/hr over 30 Minutes 12/23/2018 2257       Devices    LINES / TUBES:      Continuous Infusions: . sodium chloride 250 mL (12/26/18 0912)     Objective: Vitals:   12/28/18 0400 12/28/18 0500 12/28/18 0805 12/28/18 0830  BP: 129/71   117/70  Pulse: 81   80  Resp: (!) 22   16  Temp: 97.8 F (36.6 C)  97.6 F (36.4 C)   TempSrc: Oral     SpO2: 92%   92%  Weight:  129.5 kg    Height:        Intake/Output Summary (Last 24 hours) at 12/28/2018 1253 Last data filed at 12/28/2018 0944 Gross per 24 hour  Intake 240 ml  Output 575 ml  Net -335 ml   Filed Weights   12/17/2018 1438 12/27/2018 1439 12/28/18 0500  Weight: 131.5 kg (!) 143.8 kg 129.5 kg   Physical Exam:  General: A/O x4, positive acute respiratory distress Eyes: negative scleral hemorrhage, negative anisocoria, negative icterus ENT: Negative Runny nose, negative gingival bleeding, Neck:  Negative scars, masses, torticollis, lymphadenopathy, JVD Lungs: Diffuse decreased breath sounds without wheezes or crackles Cardiovascular: Regular rate and rhythm (pacer left chest wall does not appear paced) without murmur  gallop or rub normal S1 and S2 Abdomen: MORBIDLY OBESE negative abdominal pain, nondistended, positive soft, bowel sounds, no rebound, no ascites, no appreciable mass Extremities: No significant cyanosis, clubbing, or edema bilateral lower extremities Skin: Negative rashes, lesions, ulcers Psychiatric:  Negative depression, negative anxiety, negative fatigue, negative mania  Central nervous system:  Cranial nerves II through XII intact, tongue/uvula midline, all extremities muscle strength 5/5, sensation intact throughout,  negative dysarthria, negative expressive aphasia, negative receptive aphasia.   .     Data Reviewed: Care during the described time interval was provided by me .  I have reviewed this patient's available data, including medical history, events of note, physical examination, and all test results as part of my evaluation.   CBC: Recent Labs  Lab 01/16/2019 1508 12/25/18 0350 12/26/18 0250 12/27/18 0114 12/28/18 0149  WBC 4.4 3.5* 2.9* 3.7* 3.5*  NEUTROABS 3.4 2.7 2.1 2.7 2.6  HGB 15.2 14.8 15.0 15.3 15.9  HCT 45.7 45.1 46.3 46.4 47.6  MCV 95.2 95.6 95.7 95.3 94.1  PLT 99*  110* 133* 159 945   Basic Metabolic Panel: Recent Labs  Lab 12/20/2018 1508 12/25/18 0350 12/26/18 0250 12/27/18 0114 12/28/18 0149  NA 130* 135 135 136 136  K 3.9 4.2 4.6 4.6 3.8  CL 98 98 99 99 97*  CO2 _0 GLUCOSE 158* 175* 180* 170* 185*  BUN _1 CREATININE 1.13 0.91 0.91 0.94 0.88  CALCIUM 8.4* 8.2* 8.3* 8.3* 8.2*  MG  --  2.3 2.2 2.3 2.1  PHOS  --  3.0 3.2 3.0 3.2   GFR: Estimated Creatinine Clearance: 98 mL/min (by C-G formula based on SCr of 0.88 mg/dL). Liver Function Tests: Recent Labs  Lab 12/23/2018 1508 12/25/18 0350 12/26/18 0250 12/27/18 0114 12/28/18 0149  AST 67* 47* 49* 99* 127*  ALT 59* 51* 56* 89* 150*  ALKPHOS 31* 29* 27* 27* 32*  BILITOT 0.6 0.7 0.5 0.8 0.7  PROT 6.6 6.5 6.3* 6.1* 6.4*  ALBUMIN 3.6 3.3* 3.1* 3.3* 3.4*   Recent  Labs  Lab 01/04/2019 1508  LIPASE 31   No results for input(s): AMMONIA in the last 168 hours. Coagulation Profile: No results for input(s): INR, PROTIME in the last 168 hours. Cardiac Enzymes: No results for input(s): CKTOTAL, CKMB, CKMBINDEX, TROPONINI in the last 168 hours. BNP (last 3 results) No results for input(s): PROBNP in the last 8760 hours. HbA1C: No results for input(s): HGBA1C in the last 72 hours. CBG: No results for input(s): GLUCAP in the last 168 hours. Lipid Profile: No results for input(s): CHOL, HDL, LDLCALC, TRIG, CHOLHDL, LDLDIRECT in the last 72 hours. Thyroid Function Tests: No results for input(s): TSH, T4TOTAL, FREET4, T3FREE, THYROIDAB in the last 72 hours. Anemia Panel: Recent Labs    12/27/18 0114 12/28/18 0149  FERRITIN 724* 761*   Urine analysis:    Component Value Date/Time   COLORURINE YELLOW 12/23/2018 1900   APPEARANCEUR CLEAR 01/12/2019 1900   LABSPEC 1.015 12/19/2018 1900   PHURINE 6.0 12/27/2018 1900   GLUCOSEU NEGATIVE 01/16/2019 1900   HGBUR NEGATIVE 01/05/2019 1900   BILIRUBINUR NEGATIVE 12/28/2018 1900   KETONESUR NEGATIVE 01/07/2019 1900   PROTEINUR 30 (A) 12/28/2018 1900   UROBILINOGEN 1.0 10/03/2006 0054   NITRITE NEGATIVE 12/21/2018 1900   LEUKOCYTESUR NEGATIVE 01/14/2019 1900   Sepsis Labs: _2 (procalcitonin:4,lacticidven:4)  ) Recent Results (from the past 240 hour(s))  Blood culture (routine x 2)     Status: None (Preliminary result)   Collection Time: 12/17/2018  3:09 PM   Specimen: BLOOD  Result Value Ref Range Status   Specimen Description   Final    BLOOD BLOOD LEFT WRIST Performed at [facility], [address] [place], [place] [address]    Special Requests   Final    BOTTLES DRAWN AEROBIC ONLY Blood Culture adequate volume Performed at [facility], [address] [place], [place] [address]    Culture   Final    NO GROWTH 4 DAYS Performed at [facility], [place] [address], [place],  [place] [address]    Report Status PENDING  Incomplete  Blood culture (routine x 2)     Status: None (Preliminary result)   Collection Time: 01/05/2019  3:09 PM   Specimen: BLOOD  Result Value Ref Range Status   Specimen Description   Final    BLOOD RIGHT ANTECUBITAL Performed at [facility], [address], [place],  [address]    Special Requests  Final    BOTTLES DRAWN AEROBIC AND ANAEROBIC Blood Culture adequate volume Performed at [facility], [address] [place], [place] [address]    Culture   Final    NO GROWTH 4 DAYS Performed at [facility], [place] [address] [place], [place] [address]    Report Status PENDING  Incomplete  SARS Coronavirus 2 Ag (30 min TAT) - Nasal Swab (BD Veritor Kit)     Status: Abnormal   Collection Time: 12/27/2018  3:16 PM   Specimen: Nasal Swab (BD Veritor Kit)  Result Value Ref Range Status   SARS Coronavirus 2 Ag POSITIVE (A) NEGATIVE Final    Comment: RESULT CALLED TO, READ BACK BY AND VERIFIED WITH: MARVA SIMMS RN AT 6168 ON 12/23/2018 BY [email or web address] (NOTE) SARS-CoV-2 antigen PRESENT. Positive results indicate the presence of viral antigens, but clinical correlation with patient history and other diagnostic information is necessary to determine patient infection status.  Positive results do not rule out bacterial infection or co-infection  with other viruses. False positive results are rare but can occur, and confirmatory RT-PCR testing may be appropriate in some circumstances. The expected result is Negative. Fact Sheet for Patients: PodPark.tn Fact Sheet for Providers: GiftContent.is  This test is not yet approved or cleared by the [place] FDA and  has been authorized for detection and/or diagnosis of SARS-CoV-2 by FDA under an Emergency Use Authorization (EUA).  This EUA will remain in effect (meaning this test can be used) for  the duration of  the COVID-1 9 declaration under Section 564(b)(1) of the Act, 21 U.S.C. section 360bbb-3(b)(1), unless the authorization is terminated or revoked sooner. Performed at [facility], [address] [place], [place] [address]          Radiology Studies: DG CHEST PORT 1 VIEW  Result Date: 12/27/2018 CLINICAL DATA:  Shortness of breath. EXAM: PORTABLE CHEST 1 VIEW COMPARISON:  December 26, 2018. FINDINGS: Stable cardiomediastinal silhouette. No pneumothorax is noted. Left-sided pacemaker is unchanged in position. Stable bibasilar atelectasis or infiltrates are noted. No pleural effusion is noted. Bony thorax is unremarkable. IMPRESSION: Stable bibasilar atelectasis or infiltrates are noted. Electronically Signed   By: Marijo Conception M.D.   On: 12/27/2018 10:44        Scheduled Meds: . alfuzosin  10 mg Oral QHS  . dexamethasone (DECADRON) injection  6 mg Intravenous Q24H  . dextromethorphan-guaiFENesin  1 tablet Oral BID  . enoxaparin (LOVENOX) injection  70 mg Subcutaneous Q24H  . ezetimibe  10 mg Oral Daily  . feeding supplement (ENSURE ENLIVE)  237 mL Oral BID BM  . levothyroxine  175 mcg Oral QAC breakfast  . loratadine  10 mg Oral Daily  . mirabegron ER  50 mg Oral Daily  . vitamin C  500 mg Oral Daily  . zinc sulfate  220 mg Oral Daily   Continuous Infusions: . sodium chloride 250 mL (12/26/18 0912)     LOS: 4 days   The patient is critically ill with multiple organ systems failure and requires high complexity decision making for assessment and support, frequent evaluation and titration of therapies, application of advanced monitoring technologies and extensive interpretation of multiple databases. Critical Care Time devoted to patient care services described in this note  Time spent: 40 minutes     Dustin Bumbaugh, Geraldo Docker, MD [facility] Pager [phone]  If 7PM-7AM, please contact night-coverage [email or web address] Password  [facility] 12/28/2018, 12:53 PM

## 2018-12-29 ENCOUNTER — Inpatient Hospital Stay (HOSPITAL_COMMUNITY): Payer: Medicare HMO

## 2018-12-29 DIAGNOSIS — J431 Panlobular emphysema: Secondary | ICD-10-CM

## 2018-12-29 DIAGNOSIS — J439 Emphysema, unspecified: Secondary | ICD-10-CM | POA: Diagnosis not present

## 2018-12-29 LAB — COMPREHENSIVE METABOLIC PANEL
ALT: 119 U/L — ABNORMAL HIGH (ref 0–44)
AST: 61 U/L — ABNORMAL HIGH (ref 15–41)
Albumin: 3.6 g/dL (ref 3.5–5.0)
Alkaline Phosphatase: 32 U/L — ABNORMAL LOW (ref 38–126)
Anion gap: 13 (ref 5–15)
BUN: 23 mg/dL (ref 8–23)
CO2: 25 mmol/L (ref 22–32)
Calcium: 8.3 mg/dL — ABNORMAL LOW (ref 8.9–10.3)
Chloride: 99 mmol/L (ref 98–111)
Creatinine, Ser: 1.02 mg/dL (ref 0.61–1.24)
GFR calc Af Amer: >60 mL/min (ref >60)
GFR calc non Af Amer: >60 mL/min (ref >60)
Glucose, Bld: 166 mg/dL — ABNORMAL HIGH (ref 70–99)
Potassium: 3.5 mmol/L (ref 3.5–5.1)
Sodium: 137 mmol/L (ref 135–145)
Total Bilirubin: 1 mg/dL (ref 0.3–1.2)
Total Protein: 6.6 g/dL (ref 6.5–8.1)

## 2018-12-29 LAB — CBC WITH DIFFERENTIAL/PLATELET
Abs Immature Granulocytes: 0.07 10*3/uL (ref 0.00–0.07)
Basophils Absolute: 0 10*3/uL (ref 0.0–0.1)
Basophils Relative: 0 %
Eosinophils Absolute: 0 10*3/uL (ref 0.0–0.5)
Eosinophils Relative: 0 %
HCT: 50.4 % (ref 39.0–52.0)
Hemoglobin: 16.8 g/dL (ref 13.0–17.0)
Immature Granulocytes: 1 %
Lymphocytes Relative: 17 %
Lymphs Abs: 0.8 10*3/uL (ref 0.7–4.0)
MCH: 31.2 pg (ref 26.0–34.0)
MCHC: 33.3 g/dL (ref 30.0–36.0)
MCV: 93.5 fL (ref 80.0–100.0)
Monocytes Absolute: 0.4 10*3/uL (ref 0.1–1.0)
Monocytes Relative: 8 %
Neutro Abs: 3.5 10*3/uL (ref 1.7–7.7)
Neutrophils Relative %: 74 %
Platelets: 178 10*3/uL (ref 150–400)
RBC: 5.39 MIL/uL (ref 4.22–5.81)
RDW: 12.8 % (ref 11.5–15.5)
WBC: 4.9 10*3/uL (ref 4.0–10.5)
nRBC: 0 % (ref 0.0–0.2)

## 2018-12-29 LAB — FERRITIN: Ferritin: 716 ng/mL — ABNORMAL HIGH (ref 24–336)

## 2018-12-29 LAB — PHOSPHORUS: Phosphorus: 3.7 mg/dL (ref 2.5–4.6)

## 2018-12-29 LAB — CULTURE, BLOOD (ROUTINE X 2)
Culture: NO GROWTH
Culture: NO GROWTH
Special Requests: ADEQUATE
Special Requests: ADEQUATE

## 2018-12-29 LAB — C-REACTIVE PROTEIN: CRP: 0.6 mg/dL (ref <1.0)

## 2018-12-29 LAB — MAGNESIUM: Magnesium: 2.3 mg/dL (ref 1.7–2.4)

## 2018-12-29 LAB — D-DIMER, QUANTITATIVE: D-Dimer, Quant: 0.62 ug/mL-FEU — ABNORMAL HIGH (ref 0.00–0.50)

## 2018-12-29 MED ORDER — POTASSIUM CHLORIDE CRYS ER 20 MEQ PO TBCR
50.0000 meq | EXTENDED_RELEASE_TABLET | Freq: Once | ORAL | Status: AC
  Administered 2018-12-29: 50 meq via ORAL
  Filled 2018-12-29: qty 3

## 2018-12-29 MED ORDER — METHYLPREDNISOLONE SODIUM SUCC 125 MG IJ SOLR
125.0000 mg | Freq: Three times a day (TID) | INTRAMUSCULAR | Status: DC
  Administered 2018-12-29 – 2019-01-01 (×10): 125 mg via INTRAVENOUS
  Filled 2018-12-29 (×10): qty 2

## 2018-12-29 NOTE — Progress Notes (Addendum)
PROGRESS NOTE    Arthur Daniel  DPO:[id number] DOB: 16-Apr-1940 DOA: 12/28/2018 PCP: Shelda Pal, DO   Brief Narrative:  Arthur Daniel is a 78 y.o WM PMHx  A. fib, pacemaker, hyperlipidemia, hypothyroidism   Presents to [facility] ED due to 3-week history of abdominal pain (epigastric) with several episodes of nonbloody diarrhea ( 4-6 daily) with last episode being this morning, as well as increased urinary frequency.  No alleviating/aggravating factor for the abdominal pain and it was associated with shortness of breath when he takes deep breath, patient also complained of nonradiating midsternal chest pain.  He denies  vomiting, he states that he has been in self-isolation at home for the past 3 weeks and denies any sick contacts.   ED Course:  He was noted to be febrile with a temperature of 100.7F, was tachypneic and hypoxic with an O2 sat of 87% dose requiring supplemental oxygen via HFNC at 8lpm.  BP was 137/65.  Work-up in the ED showed thrombocytopenia, hyponatremia, hyperglycemia, elevated transaminitis, urinalysis was normal, procalcitonin was <0.10, LDH was elevated at 177, CRP 3.9, fibrinogen 522, LDH 277, lactic acid 1.2. SARS coronavirus 2 Ag was positive.  Developing infiltrates in the lung bases. He was treated with IV Decadron 6 mg, remdesivir, Actemra and Tylenol.  Patient was transferred to [facility] for further management.   Subjective: 12/13 last 24 hours afebrile    afebrile overnight negative N/V, positive S OB but improved.    Assessment & Plan:   Principal Problem:   COVID-19 Active Problems:   Hypothyroidism   Essential hypertension   Cardiac pacemaker in situ   BPH (benign prostatic hyperplasia)   Anxiety   Acute respiratory failure with hypoxia (HCC)   Dyslipidemia   Pneumonia due to COVID-19 virus   Gastroenteritis due to COVID-19 virus   Thrombocytopenia (HCC)   Emphysema of lung (HCC)   Covid pneumonia/acute respiratory failure with  hypoxia COVID-19 Labs  Recent Labs    12/27/18 0114 12/28/18 0149 12/29/18 0252  DDIMER 0.42 0.51* 0.62*  FERRITIN 724* 761* 716*  CRP 1.2* 0.7 0.6   12/8 SARS coronavirus positive -12/13 Decadron 6 mg daily--Solu-Medrol 125 mg TID -Remdesivir per pharmacy protocol -Vitamins per Covid protocol -12/9 Actemra x1 dose -Vitamins per Covid protocol -Titrate O2 to maintain SPO2> 88% -Prone patient 16 hours/day; if cannot tolerate prone 2 to 3 hours per shift -12/10 PCXR; slightly worsening aeration -12/11 PCXR; infiltrates or atelectasis see results below -12/12 Lasix IV 60 mg twice daily x2 doses  Emphysema -Emphysema on 12/13 chest CT see below -See Covid pneumonia  Covid gastroenteritis -Abdominal pain/diarrhea resolved most likely secondary to Covid infection -Continue Zofran as needed  BPH -Patient complained of increased urinary frequency, bladder scan done only showed 40 mL in the bladder with no signs of retention. -Uroxatral 10 mg qhs -12/10 no further acute symptoms  Hyponatremia -Resolved  Hypokalemia -Potassium goal> 1 -K-Dur 50 mEq  Elevated transaminitis AST 67, ALT 59 Continue to monitor liver panel with morning labs  Chronic thrombocytopenia -Platelets currently at 99, no obvious sign of bleeding -Continue to monitor platelets troponin labs Results for VERDON, FERRANTE (MRN [id number]) as of 12/26/2018 10:54  Ref. Range 12/21/2018 15:08 12/25/2018 03:50 12/26/2018 02:50  Platelets Latest Ref Range: 150 - 400 K/uL 99 (L) 110 (L) 133 (L)  -Improving  Hypothyroidism -Synthroid 175 mcg daily   Essential hypertension -12/9 patient's BP controlled without metoprolol hold at this time.  If patient's BP begins to  climb would then start home medication. -Strict in and out -3.0 L -Daily weight   Permanent pacer -Pacer installed secondary to symptomatic bradycardia secondary to thyroid disease.  Only kicks in if patient's heart rate significantly  decreased.  HLD -Zetia 10 mg daily    DVT prophylaxis: Lovenox Code Status: Full Family Communication: 12/13 spoke with Sunday Spillers (wife) explained plan of care answered all questions Disposition Plan: TBD   Consultants:    Procedures/Significant Events:  12/10 PCXR;Persistent bibasilar infiltrates with slight worsening aeration 12/11 PCXR stable bibasilar atelectasis or infiltrates 12/13 CT chest WO contrast;-bilateral ground-glass airspace opacities with a lower lung predominance, consistent with extensive COVID-19 pneumonia. -2. 5 mm nodule in the right lower lobe. No follow-up needed if patient is low-risk. Non-contrast chest CT can be considered in 12 months if patient is high-risk. T - Left coronary artery calcifications. Aortic atherosclerosis. -Emphysema.    I have personally reviewed and interpreted all radiology studies and my findings are as above.  VENTILATOR SETTINGS: HFNC 12/13 Flow; 15 L/min SPO2 98%    Cultures 12/8 SARS coronavirus positive 12/8 blood LEFT wrist NGTD 12/8 blood RIGHT antecubital NGTD   Antimicrobials: Anti-infectives (From admission, onward)   Start     Dose/Rate Stop   12/25/18 1000  remdesivir 100 mg in sodium chloride 0.9 % 100 mL IVPB  Status:  Discontinued     100 mg 200 mL/hr over 30 Minutes 01/08/2019 2257   12/25/18 1000  remdesivir 100 mg in sodium chloride 0.9 % 100 mL IVPB     100 mg 200 mL/hr over 30 Minutes 12/29/18 0959   12/30/2018 2330  remdesivir 200 mg in sodium chloride 0.9% 250 mL IVPB     200 mg 580 mL/hr over 30 Minutes 12/25/18 0029   12/30/2018 1630  remdesivir 200 mg in sodium chloride 0.9% 250 mL IVPB  Status:  Discontinued     200 mg 580 mL/hr over 30 Minutes 01/14/2019 2257       Devices    LINES / TUBES:      Continuous Infusions: . sodium chloride 250 mL (12/26/18 0912)     Objective: Vitals:   12/29/18 0500 12/29/18 0905 12/29/18 0958 12/29/18 1239  BP:   (!) 145/73 121/72  Pulse:   91  87  Resp:   19 18  Temp:   (!) 97 F (36.1 C) (!) 97.4 F (36.3 C)  TempSrc:   Axillary Axillary  SpO2:  90% 90% 91%  Weight: 128 kg     Height:        Intake/Output Summary (Last 24 hours) at 12/29/2018 1637 Last data filed at 12/29/2018 1500 Gross per 24 hour  Intake 120 ml  Output 2000 ml  Net -1880 ml   Filed Weights   12/26/2018 1439 12/28/18 0500 12/29/18 0500  Weight: (!) 143.8 kg 129.5 kg 128 kg   Physical Exam:  General: A/O x4, positive acute respiratory distress Eyes: negative scleral hemorrhage, negative anisocoria, negative icterus ENT: Negative Runny nose, negative gingival bleeding, Neck:  Negative scars, masses, torticollis, lymphadenopathy, JVD Lungs: Tachypneic diffuse decreased breath sounds, bibasilar crackles without wheezes  Cardiovascular: Regular rate and rhythm (pacer left chest wall does not appear paced) without murmur gallop or rub normal S1 and S2 Abdomen: MORBIDLY OBESE, negative abdominal pain, nondistended, positive soft, bowel sounds, no rebound, no ascites, no appreciable mass Extremities: No significant cyanosis, clubbing, or edema bilateral lower extremities Skin: Negative rashes, lesions, ulcers Psychiatric:  Negative depression, negative anxiety, negative fatigue,  negative mania  Central nervous system:  Cranial nerves II through XII intact, tongue/uvula midline, all extremities muscle strength 5/5, sensation intact throughout, negative dysarthria, negative expressive aphasia, negative receptive aphasia.   .     Data Reviewed: Care during the described time interval was provided by me .  I have reviewed this patient's available data, including medical history, events of note, physical examination, and all test results as part of my evaluation.   CBC: Recent Labs  Lab 12/25/18 0350 12/26/18 0250 12/27/18 0114 12/28/18 0149 12/29/18 0252  WBC 3.5* 2.9* 3.7* 3.5* 4.9  NEUTROABS 2.7 2.1 2.7 2.6 3.5  HGB 14.8 15.0 15.3 15.9 16.8   HCT 45.1 46.3 46.4 47.6 50.4  MCV 95.6 95.7 95.3 94.1 93.5  PLT 110* 133* 159 172 161   Basic Metabolic Panel: Recent Labs  Lab 12/25/18 0350 12/26/18 0250 12/27/18 0114 12/28/18 0149 12/29/18 0252  NA 135 135 136 136 137  K 4.2 4.6 4.6 3.8 3.5  CL 98 99 99 97* 99  CO2 _0 GLUCOSE 175* 180* 170* 185* 166*  BUN _1 CREATININE 0.91 0.91 0.94 0.88 1.02  CALCIUM 8.2* 8.3* 8.3* 8.2* 8.3*  MG 2.3 2.2 2.3 2.1 2.3  PHOS 3.0 3.2 3.0 3.2 3.7   GFR: Estimated Creatinine Clearance: 84 mL/min (by C-G formula based on SCr of 1.02 mg/dL). Liver Function Tests: Recent Labs  Lab 12/25/18 0350 12/26/18 0250 12/27/18 0114 12/28/18 0149 12/29/18 0252  AST 47* 49* 99* 127* 61*  ALT 51* 56* 89* 150* 119*  ALKPHOS 29* 27* 27* 32* 32*  BILITOT 0.7 0.5 0.8 0.7 1.0  PROT 6.5 6.3* 6.1* 6.4* 6.6  ALBUMIN 3.3* 3.1* 3.3* 3.4* 3.6   Recent Labs  Lab 01/16/2019 1508  LIPASE 31   No results for input(s): AMMONIA in the last 168 hours. Coagulation Profile: No results for input(s): INR, PROTIME in the last 168 hours. Cardiac Enzymes: No results for input(s): CKTOTAL, CKMB, CKMBINDEX, TROPONINI in the last 168 hours. BNP (last 3 results) No results for input(s): PROBNP in the last 8760 hours. HbA1C: No results for input(s): HGBA1C in the last 72 hours. CBG: No results for input(s): GLUCAP in the last 168 hours. Lipid Profile: No results for input(s): CHOL, HDL, LDLCALC, TRIG, CHOLHDL, LDLDIRECT in the last 72 hours. Thyroid Function Tests: No results for input(s): TSH, T4TOTAL, FREET4, T3FREE, THYROIDAB in the last 72 hours. Anemia Panel: Recent Labs    12/28/18 0149 12/29/18 0252  FERRITIN 761* 716*   Urine analysis:    Component Value Date/Time   COLORURINE YELLOW 01/12/2019 1900   APPEARANCEUR CLEAR 01/03/2019 1900   LABSPEC 1.015 12/31/2018 1900   PHURINE 6.0 12/25/2018 1900   GLUCOSEU NEGATIVE 12/30/2018 1900   HGBUR NEGATIVE 01/10/2019 1900    BILIRUBINUR NEGATIVE 12/23/2018 1900   KETONESUR NEGATIVE 12/25/2018 1900   PROTEINUR 30 (A) 12/20/2018 1900   UROBILINOGEN 1.0 10/03/2006 0054   NITRITE NEGATIVE 01/07/2019 1900   LEUKOCYTESUR NEGATIVE 01/04/2019 1900   Sepsis Labs: _2 (procalcitonin:4,lacticidven:4)  ) Recent Results (from the past 240 hour(s))  Blood culture (routine x 2)     Status: None   Collection Time: 12/30/2018  3:09 PM   Specimen: BLOOD  Result Value Ref Range Status   Specimen Description   Final    BLOOD BLOOD LEFT WRIST Performed at [facility], [address] [place], [place] [address]    Special Requests  Final    BOTTLES DRAWN AEROBIC ONLY Blood Culture adequate volume Performed at [facility], [address] [place], [place] [address]    Culture   Final    NO GROWTH 5 DAYS Performed at [facility], [place] [address], [place], [place] [address]    Report Status 12/29/2018 FINAL  Final  Blood culture (routine x 2)     Status: None   Collection Time: 12/19/2018  3:09 PM   Specimen: BLOOD  Result Value Ref Range Status   Specimen Description   Final    BLOOD RIGHT ANTECUBITAL Performed at [facility], [address] [place], [place] [address]    Special Requests   Final    BOTTLES DRAWN AEROBIC AND ANAEROBIC Blood Culture adequate volume Performed at [facility], [address] [place], [place] [address]    Culture   Final    NO GROWTH 5 DAYS Performed at [facility], [place] [address] [place], [place] [address]    Report Status 12/29/2018 FINAL  Final  SARS Coronavirus 2 Ag (30 min TAT) - Nasal Swab (BD Veritor Kit)     Status: Abnormal   Collection Time: 01/15/2019  3:16 PM   Specimen: Nasal Swab (BD Veritor Kit)  Result Value Ref Range Status   SARS Coronavirus 2 Ag POSITIVE (A) NEGATIVE Final    Comment: RESULT CALLED TO, READ BACK BY AND VERIFIED WITH: MARVA SIMMS RN AT 3833 ON 01/09/2019 BY [email or web address] (NOTE) SARS-CoV-2  antigen PRESENT. Positive results indicate the presence of viral antigens, but clinical correlation with patient history and other diagnostic information is necessary to determine patient infection status.  Positive results do not rule out bacterial infection or co-infection  with other viruses. False positive results are rare but can occur, and confirmatory RT-PCR testing may be appropriate in some circumstances. The expected result is Negative. Fact Sheet for Patients: PodPark.tn Fact Sheet for Providers: GiftContent.is  This test is not yet approved or cleared by the [place] FDA and  has been authorized for detection and/or diagnosis of SARS-CoV-2 by FDA under an Emergency Use Authorization (EUA).  This EUA will remain in effect (meaning this test can be used) for the duration of  the COVID-1 9 declaration under Section 564(b)(1) of the Act, 21 U.S.C. section 360bbb-3(b)(1), unless the authorization is terminated or revoked sooner. Performed at [facility], [address], [place], [place] [address]          Radiology Studies: CT CHEST WO CONTRAST  Result Date: 12/29/2018 CLINICAL DATA:  Short of breath.  COVID-19 positive. EXAM: CT CHEST WITHOUT CONTRAST TECHNIQUE: Multidetector CT imaging of the chest was performed following the standard protocol without IV contrast. COMPARISON:  Chest radiograph, 12/27/2018 and earlier studies. FINDINGS: Cardiovascular: Heart is normal in size. Left coronary artery calcifications. No pericardial effusion. Great vessels are normal in caliber. Aortic atherosclerotic calcifications. No aneurysm. Mediastinum/Nodes: No neck base, axillary, mediastinal or hilar masses or enlarged lymph nodes. Trachea and esophagus are unremarkable. Left anterior chest wall sequential pacemaker is well positioned. Lungs/Pleura: Bilateral ground-glass airspace opacities with sparing of the apices.  Underlying changes of paraseptal and centrilobular emphysema. 5 mm nodule, right lower lobe, image 33, series 4. No other nodules. No pleural effusion. No pneumothorax. Upper Abdomen: No acute abnormality. Musculoskeletal: No fracture or acute finding.  No bone lesion. IMPRESSION: 1. Bilateral ground-glass airspace opacities with a lower lung predominance, consistent  with extensive COVID-19 pneumonia. 2. 5 mm nodule in the right lower lobe. No follow-up needed if patient is low-risk. Non-contrast chest CT can be considered in 12 months if patient is high-risk. This recommendation follows the consensus statement: Guidelines for Management of Incidental Pulmonary Nodules Detected on CT Images: From the Fleischner Society 2017; Radiology 2017; 284:228-243. 3. Left coronary artery calcifications. Aortic atherosclerosis. Emphysema. Aortic Atherosclerosis (ICD10-I70.0) and Emphysema (ICD10-J43.9). Electronically Signed   By: Lajean Manes M.D.   On: 12/29/2018 15:17        Scheduled Meds: . alfuzosin  10 mg Oral QHS  . dextromethorphan-guaiFENesin  1 tablet Oral BID  . enoxaparin (LOVENOX) injection  70 mg Subcutaneous Q24H  . ezetimibe  10 mg Oral Daily  . feeding supplement (ENSURE ENLIVE)  237 mL Oral BID BM  . levothyroxine  175 mcg Oral QAC breakfast  . loratadine  10 mg Oral Daily  . methylPREDNISolone (SOLU-MEDROL) injection  125 mg Intravenous TID  . mirabegron ER  50 mg Oral Daily  . vitamin C  500 mg Oral Daily  . zinc sulfate  220 mg Oral Daily   Continuous Infusions: . sodium chloride 250 mL (12/26/18 0912)     LOS: 5 days   The patient is critically ill with multiple organ systems failure and requires high complexity decision making for assessment and support, frequent evaluation and titration of therapies, application of advanced monitoring technologies and extensive interpretation of multiple databases. Critical Care Time devoted to patient care services described in this note   Time spent: 40 minutes     Ellana Kawa, Geraldo Docker, MD [facility] Pager [phone]  If 7PM-7AM, please contact night-coverage [email or web address] Password [facility] 12/29/2018, 4:37 PM

## 2018-12-29 NOTE — Progress Notes (Signed)
Talked to wife slyvia questions and concerns addressed.    12/29/18 1400  Family/Significant Other Communication  Family/Significant Other Update Called

## 2018-12-29 NOTE — Progress Notes (Signed)
ICU CN/RRN Rounding Note - Patient with oxygen saturation in the low 90s on 15L HFNC + NRB. No respiratory distress. Education performed on proning. ICU team will continue to round on this patient. If the bedside RN needs any assistance please call charge [phone]

## 2018-12-30 LAB — CBC WITH DIFFERENTIAL/PLATELET
Abs Immature Granulocytes: 0.21 10*3/uL — ABNORMAL HIGH (ref 0.00–0.07)
Basophils Absolute: 0 10*3/uL (ref 0.0–0.1)
Basophils Relative: 0 %
Eosinophils Absolute: 0 10*3/uL (ref 0.0–0.5)
Eosinophils Relative: 0 %
HCT: 54 % — ABNORMAL HIGH (ref 39.0–52.0)
Hemoglobin: 18.3 g/dL — ABNORMAL HIGH (ref 13.0–17.0)
Immature Granulocytes: 2 %
Lymphocytes Relative: 7 %
Lymphs Abs: 0.7 10*3/uL (ref 0.7–4.0)
MCH: 31.9 pg (ref 26.0–34.0)
MCHC: 33.9 g/dL (ref 30.0–36.0)
MCV: 94.1 fL (ref 80.0–100.0)
Monocytes Absolute: 0.6 10*3/uL (ref 0.1–1.0)
Monocytes Relative: 6 %
Neutro Abs: 8.4 10*3/uL — ABNORMAL HIGH (ref 1.7–7.7)
Neutrophils Relative %: 85 %
Platelets: 212 10*3/uL (ref 150–400)
RBC: 5.74 MIL/uL (ref 4.22–5.81)
RDW: 12.9 % (ref 11.5–15.5)
WBC: 9.9 10*3/uL (ref 4.0–10.5)
nRBC: 0 % (ref 0.0–0.2)

## 2018-12-30 LAB — COMPREHENSIVE METABOLIC PANEL
ALT: 90 U/L — ABNORMAL HIGH (ref 0–44)
AST: 36 U/L (ref 15–41)
Albumin: 3.6 g/dL (ref 3.5–5.0)
Alkaline Phosphatase: 36 U/L — ABNORMAL LOW (ref 38–126)
Anion gap: 15 (ref 5–15)
BUN: 27 mg/dL — ABNORMAL HIGH (ref 8–23)
CO2: 26 mmol/L (ref 22–32)
Calcium: 8.6 mg/dL — ABNORMAL LOW (ref 8.9–10.3)
Chloride: 97 mmol/L — ABNORMAL LOW (ref 98–111)
Creatinine, Ser: 1.11 mg/dL (ref 0.61–1.24)
GFR calc Af Amer: >60 mL/min (ref >60)
GFR calc non Af Amer: >60 mL/min (ref >60)
Glucose, Bld: 223 mg/dL — ABNORMAL HIGH (ref 70–99)
Potassium: 3.8 mmol/L (ref 3.5–5.1)
Sodium: 138 mmol/L (ref 135–145)
Total Bilirubin: 1.1 mg/dL (ref 0.3–1.2)
Total Protein: 6.8 g/dL (ref 6.5–8.1)

## 2018-12-30 LAB — C-REACTIVE PROTEIN: CRP: <0.5 mg/dL (ref <1.0)

## 2018-12-30 LAB — MAGNESIUM: Magnesium: 2.5 mg/dL — ABNORMAL HIGH (ref 1.7–2.4)

## 2018-12-30 LAB — D-DIMER, QUANTITATIVE: D-Dimer, Quant: 0.43 ug/mL-FEU (ref 0.00–0.50)

## 2018-12-30 LAB — PHOSPHORUS: Phosphorus: 2.8 mg/dL (ref 2.5–4.6)

## 2018-12-30 LAB — FERRITIN: Ferritin: 593 ng/mL — ABNORMAL HIGH (ref 24–336)

## 2018-12-30 MED ORDER — FUROSEMIDE 10 MG/ML IJ SOLN
60.0000 mg | Freq: Two times a day (BID) | INTRAMUSCULAR | Status: AC
  Administered 2018-12-30 – 2018-12-31 (×2): 60 mg via INTRAVENOUS
  Filled 2018-12-30 (×2): qty 6

## 2018-12-30 NOTE — Progress Notes (Signed)
   12/30/18 0331  MEWS Score  Resp (!) 24  ECG Heart Rate 99  Pulse Rate 99  BP 114/69  Temp (!) 96.3 F (35.7 C)  SpO2 (!) 83 %  O2 Device Non-rebreather Mask  MEWS Score  MEWS RR 1  MEWS Pulse 0  MEWS Systolic 0  MEWS LOC 0  MEWS Temp 1  MEWS Score 2  MEWS Score Color Yellow

## 2018-12-30 NOTE — Progress Notes (Signed)
   12/30/18 0616  MEWS Score  Resp (!) 23  ECG Heart Rate (!) 101  Pulse Rate 99  BP 139/76  SpO2 (!) 88 %  MEWS Score  MEWS RR 1  MEWS Pulse 1  MEWS Systolic 0  MEWS LOC 0  MEWS Temp 0  MEWS Score 2  MEWS Score Color Yellow

## 2018-12-30 NOTE — Progress Notes (Signed)
PROGRESS NOTE    Arthur Daniel  YWV:[id number] DOB: 1940/06/02 DOA: 12/26/2018 PCP: Shelda Pal, DO   Brief Narrative:  Arthur Daniel is a 78 y.o WM PMHx  A. fib, pacemaker, hyperlipidemia, hypothyroidism   Presents to [facility] ED due to 3-week history of abdominal pain (epigastric) with several episodes of nonbloody diarrhea ( 4-6 daily) with last episode being this morning, as well as increased urinary frequency.  No alleviating/aggravating factor for the abdominal pain and it was associated with shortness of breath when he takes deep breath, patient also complained of nonradiating midsternal chest pain.  He denies  vomiting, he states that he has been in self-isolation at home for the past 3 weeks and denies any sick contacts.   ED Course:  He was noted to be febrile with a temperature of 100.30F, was tachypneic and hypoxic with an O2 sat of 87% dose requiring supplemental oxygen via HFNC at 8lpm.  BP was 137/65.  Work-up in the ED showed thrombocytopenia, hyponatremia, hyperglycemia, elevated transaminitis, urinalysis was normal, procalcitonin was <0.10, LDH was elevated at 177, CRP 3.9, fibrinogen 522, LDH 277, lactic acid 1.2. SARS coronavirus 2 Ag was positive.  Developing infiltrates in the lung bases. He was treated with IV Decadron 6 mg, remdesivir, Actemra and Tylenol.  Patient was transferred to [facility] for further management.   Subjective: 12/14 afebrile overnight.  This point his respiratory status not improving faster.  Positive anorexia, negative abdominal pain, negative N/V     Assessment & Plan:   Principal Problem:   COVID-19 Active Problems:   Hypothyroidism   Essential hypertension   Cardiac pacemaker in situ   BPH (benign prostatic hyperplasia)   Anxiety   Acute respiratory failure with hypoxia (HCC)   Dyslipidemia   Pneumonia due to COVID-19 virus   Gastroenteritis due to COVID-19 virus   Thrombocytopenia (HCC)   Emphysema of lung (HCC)   Covid  pneumonia/acute respiratory failure with hypoxia COVID-19 Labs  Recent Labs    12/28/18 0149 12/29/18 0252  DDIMER 0.51* 0.62*  FERRITIN 761* 716*  CRP 0.7 0.6   12/8 SARS coronavirus positive -12/13 Decadron 6 mg daily---->Solu-Medrol 125 mg TID -Remdesivir per pharmacy protocol -Vitamins per Covid protocol -12/9 Actemra x1 dose -Vitamins per Covid protocol -Titrate O2 to maintain SPO2> 88% -Prone patient 16 hours/day; if cannot tolerate prone 2 to 3 hours per shift -12/10 PCXR; slightly worsening aeration -12/11 PCXR; infiltrates or atelectasis see results below -12/13 CT scan showed underlying emphysema (patient unaware)/severe Covid pneumonia see results below -Lasix as needed -12/14 Lasix IV 60 mg BID x2 doses  Emphysema -Emphysema on 12/13 chest CT see below -See Covid pneumonia  Covid gastroenteritis -Abdominal pain/diarrhea resolved most likely secondary to Covid infection -Continue Zofran as needed  BPH -Patient complained of increased urinary frequency, bladder scan done only showed 40 mL in the bladder with no signs of retention. -Uroxatral 10 mg qhs -12/10 no further acute symptoms  Hyponatremia -Resolved  Hypokalemia -Potassium goal> 4  Elevated transaminitis AST 67, ALT 59 Continue to monitor liver panel with morning labs  Chronic thrombocytopenia -Platelets currently at 99, no obvious sign of bleeding -Continue to monitor platelets troponin labs Results for IZMAEL, DUROSS (MRN [id number]) as of 12/30/2018 16:00  Ref. Range 12/26/2018 02:50 12/27/2018 01:14 12/28/2018 01:49 12/29/2018 02:52 12/30/2018 10:56  Platelets Latest Ref Range: 150 - 400 K/uL 133 (L) 159 172 178 212  -Resolved  Hypothyroidism -Synthroid 175 mcg daily   Essential hypertension -12/9  patient's BP controlled without metoprolol hold at this time.  If patient's BP begins to climb would then start home medication. -Strict in and out -4.3 L -Daily weight   Permanent  pacer -Pacer installed secondary to symptomatic bradycardia secondary to thyroid disease.  Only kicks in if patient's heart rate significantly decreased.  HLD -Zetia 10 mg daily    DVT prophylaxis: Lovenox Code Status: Full Family Communication: 12/13 spoke with Sunday Spillers (wife) explained plan of care answered all questions Disposition Plan: TBD   Consultants:    Procedures/Significant Events:  12/10 PCXR;Persistent bibasilar infiltrates with slight worsening aeration 12/11 PCXR stable bibasilar atelectasis or infiltrates 12/13 CT chest WO contrast;-bilateral ground-glass airspace opacities with a lower lung predominance, consistent with extensive COVID-19 pneumonia. -2. 5 mm nodule in the right lower lobe. No follow-up needed if patient is low-risk. Non-contrast chest CT can be considered in 12 months if patient is high-risk. T - Left coronary artery calcifications. Aortic atherosclerosis. -Emphysema.    I have personally reviewed and interpreted all radiology studies and my findings are as above.  VENTILATOR SETTINGS: HFNC 12/14 Flow; 15 L/min SPO2 92%    Cultures 12/8 SARS coronavirus positive 12/8 blood LEFT wrist NGTD 12/8 blood RIGHT antecubital NGTD   Antimicrobials: Anti-infectives (From admission, onward)   Start     Dose/Rate Stop   12/25/18 1000  remdesivir 100 mg in sodium chloride 0.9 % 100 mL IVPB  Status:  Discontinued     100 mg 200 mL/hr over 30 Minutes 01/10/2019 2257   12/25/18 1000  remdesivir 100 mg in sodium chloride 0.9 % 100 mL IVPB     100 mg 200 mL/hr over 30 Minutes 12/29/18 0959   01/08/2019 2330  remdesivir 200 mg in sodium chloride 0.9% 250 mL IVPB     200 mg 580 mL/hr over 30 Minutes 12/25/18 0029   01/16/2019 1630  remdesivir 200 mg in sodium chloride 0.9% 250 mL IVPB  Status:  Discontinued     200 mg 580 mL/hr over 30 Minutes 01/16/2019 2257       Devices    LINES / TUBES:      Continuous Infusions: . sodium chloride 250 mL  (12/26/18 0912)     Objective: Vitals:   12/30/18 0331 12/30/18 0613 12/30/18 0616 12/30/18 0758  BP: 114/69  139/76 117/75  Pulse: 99 98 99 (!) 101  Resp: (!) 24 (!) 21 (!) 23 (!) 27  Temp: (!) 96.3 F (35.7 C) (!) 97.5 F (36.4 C)  (!) 97.2 F (36.2 C)  TempSrc: Axillary Oral  Oral  SpO2: (!) 83% 90% (!) 88% 93%  Weight:      Height:        Intake/Output Summary (Last 24 hours) at 12/30/2018 0845 Last data filed at 12/30/2018 0801 Gross per 24 hour  Intake 120 ml  Output 1450 ml  Net -1330 ml   Filed Weights   01/16/2019 1439 12/28/18 0500 12/29/18 0500  Weight: (!) 143.8 kg 129.5 kg 128 kg   Physical Exam:  General: A/O x4, positive acute respiratory distress Eyes: negative scleral hemorrhage, negative anisocoria, negative icterus ENT: Negative Runny nose, negative gingival bleeding, Neck:  Negative scars, masses, torticollis, lymphadenopathy, JVD Lungs: Tachypneic, diffuse decreased breath sounds, without wheezes or crackles Cardiovascular: Tachycardic, (pacer left chest wall currently not being paced), without murmur gallop or rub normal S1 and S2 Abdomen: MORBIDLY OBESE negative abdominal pain, nondistended, positive soft, bowel sounds, no rebound, no ascites, no appreciable mass Extremities: No significant  cyanosis, clubbing, or edema bilateral lower extremities Skin: Negative rashes, lesions, ulcers Psychiatric: Positive depression, negative anxiety, negative fatigue, negative mania  Central nervous system:  Cranial nerves II through XII intact, tongue/uvula midline, all extremities muscle strength 5/5, sensation intact throughout, negative dysarthria, negative expressive aphasia, negative receptive aphasia.   .     Data Reviewed: Care during the described time interval was provided by me .  I have reviewed this patient's available data, including medical history, events of note, physical examination, and all test results as part of my evaluation.    CBC: Recent Labs  Lab 12/25/18 0350 12/26/18 0250 12/27/18 0114 12/28/18 0149 12/29/18 0252  WBC 3.5* 2.9* 3.7* 3.5* 4.9  NEUTROABS 2.7 2.1 2.7 2.6 3.5  HGB 14.8 15.0 15.3 15.9 16.8  HCT 45.1 46.3 46.4 47.6 50.4  MCV 95.6 95.7 95.3 94.1 93.5  PLT 110* 133* 159 172 673   Basic Metabolic Panel: Recent Labs  Lab 12/25/18 0350 12/26/18 0250 12/27/18 0114 12/28/18 0149 12/29/18 0252  NA 135 135 136 136 137  K 4.2 4.6 4.6 3.8 3.5  CL 98 99 99 97* 99  CO2 _0 GLUCOSE 175* 180* 170* 185* 166*  BUN _1 CREATININE 0.91 0.91 0.94 0.88 1.02  CALCIUM 8.2* 8.3* 8.3* 8.2* 8.3*  MG 2.3 2.2 2.3 2.1 2.3  PHOS 3.0 3.2 3.0 3.2 3.7   GFR: Estimated Creatinine Clearance: 84 mL/min (by C-G formula based on SCr of 1.02 mg/dL). Liver Function Tests: Recent Labs  Lab 12/25/18 0350 12/26/18 0250 12/27/18 0114 12/28/18 0149 12/29/18 0252  AST 47* 49* 99* 127* 61*  ALT 51* 56* 89* 150* 119*  ALKPHOS 29* 27* 27* 32* 32*  BILITOT 0.7 0.5 0.8 0.7 1.0  PROT 6.5 6.3* 6.1* 6.4* 6.6  ALBUMIN 3.3* 3.1* 3.3* 3.4* 3.6   Recent Labs  Lab 12/29/2018 1508  LIPASE 31   No results for input(s): AMMONIA in the last 168 hours. Coagulation Profile: No results for input(s): INR, PROTIME in the last 168 hours. Cardiac Enzymes: No results for input(s): CKTOTAL, CKMB, CKMBINDEX, TROPONINI in the last 168 hours. BNP (last 3 results) No results for input(s): PROBNP in the last 8760 hours. HbA1C: No results for input(s): HGBA1C in the last 72 hours. CBG: No results for input(s): GLUCAP in the last 168 hours. Lipid Profile: No results for input(s): CHOL, HDL, LDLCALC, TRIG, CHOLHDL, LDLDIRECT in the last 72 hours. Thyroid Function Tests: No results for input(s): TSH, T4TOTAL, FREET4, T3FREE, THYROIDAB in the last 72 hours. Anemia Panel: Recent Labs    12/28/18 0149 12/29/18 0252  FERRITIN 761* 716*   Urine analysis:    Component Value Date/Time   COLORURINE YELLOW  01/10/2019 1900   APPEARANCEUR CLEAR 12/21/2018 1900   LABSPEC 1.015 01/02/2019 1900   PHURINE 6.0 12/28/2018 1900   GLUCOSEU NEGATIVE 01/16/2019 1900   HGBUR NEGATIVE 01/15/2019 1900   BILIRUBINUR NEGATIVE 01/07/2019 1900   KETONESUR NEGATIVE 12/27/2018 1900   PROTEINUR 30 (A) 12/31/2018 1900   UROBILINOGEN 1.0 10/03/2006 0054   NITRITE NEGATIVE 01/02/2019 1900   LEUKOCYTESUR NEGATIVE 01/15/2019 1900   Sepsis Labs: _2 (procalcitonin:4,lacticidven:4)  ) Recent Results (from the past 240 hour(s))  Blood culture (routine x 2)     Status: None   Collection Time: 01/16/2019  3:09 PM   Specimen: BLOOD  Result Value Ref Range Status   Specimen Description   Final    BLOOD BLOOD LEFT WRIST Performed at  [facility], [address] [place], [place] [address]    Special Requests   Final    BOTTLES DRAWN AEROBIC ONLY Blood Culture adequate volume Performed at [facility], [address] [place], [place] [address]    Culture   Final    NO GROWTH 5 DAYS Performed at [facility], [place] [address], [place], [place], [place] [address]    Report Status 12/29/2018 FINAL  Final  Blood culture (routine x 2)     Status: None   Collection Time: 12/25/2018  3:09 PM   Specimen: BLOOD  Result Value Ref Range Status   Specimen Description   Final    BLOOD RIGHT ANTECUBITAL Performed at [facility], [address] [place], [place] [address]    Special Requests   Final    BOTTLES DRAWN AEROBIC AND ANAEROBIC Blood Culture adequate volume Performed at [facility], [address] [place], [place] [address]    Culture   Final    NO GROWTH 5 DAYS Performed at [facility], [place] [address], [place], [place] [address]    Report Status 12/29/2018 FINAL  Final  SARS Coronavirus 2 Ag (30 min TAT) - Nasal Swab (BD Veritor Kit)     Status: Abnormal   Collection Time: 12/17/2018  3:16 PM   Specimen: Nasal Swab (BD Veritor Kit)  Result Value Ref  Range Status   SARS Coronavirus 2 Ag POSITIVE (A) NEGATIVE Final    Comment: RESULT CALLED TO, READ BACK BY AND VERIFIED WITH: MARVA SIMMS RN AT 2620 ON 12/23/2018 BY [email or web address] (NOTE) SARS-CoV-2 antigen PRESENT. Positive results indicate the presence of viral antigens, but clinical correlation with patient history and other diagnostic information is necessary to determine patient infection status.  Positive results do not rule out bacterial infection or co-infection  with other viruses. False positive results are rare but can occur, and confirmatory RT-PCR testing may be appropriate in some circumstances. The expected result is Negative. Fact Sheet for Patients: PodPark.tn Fact Sheet for Providers: GiftContent.is  This test is not yet approved or cleared by the [place] FDA and  has been authorized for detection and/or diagnosis of SARS-CoV-2 by FDA under an Emergency Use Authorization (EUA).  This EUA will remain in effect (meaning this test can be used) for the duration of  the COVID-1 9 declaration under Section 564(b)(1) of the Act, 21 U.S.C. section 360bbb-3(b)(1), unless the authorization is terminated or revoked sooner. Performed at [facility], [address], [place],  [address]          Radiology Studies: CT CHEST WO CONTRAST  Result Date: 12/29/2018 CLINICAL DATA:  Short of breath.  COVID-19 positive. EXAM: CT CHEST WITHOUT CONTRAST TECHNIQUE: Multidetector CT imaging of the chest was performed following the standard protocol without IV contrast. COMPARISON:  Chest radiograph, 12/27/2018 and earlier studies. FINDINGS: Cardiovascular: Heart is normal in size. Left coronary artery calcifications. No pericardial effusion. Great vessels are normal in caliber. Aortic atherosclerotic calcifications. No aneurysm. Mediastinum/Nodes: No neck base, axillary, mediastinal or hilar masses or enlarged lymph  nodes. Trachea and esophagus are unremarkable. Left anterior chest wall sequential pacemaker is well positioned. Lungs/Pleura: Bilateral ground-glass airspace opacities with sparing of the apices. Underlying changes of paraseptal and centrilobular emphysema. 5 mm nodule, right lower lobe, image 33, series 4. No other nodules. No pleural effusion. No pneumothorax. Upper Abdomen: No acute abnormality. Musculoskeletal: No fracture  or acute finding.  No bone lesion. IMPRESSION: 1. Bilateral ground-glass airspace opacities with a lower lung predominance, consistent with extensive COVID-19 pneumonia. 2. 5 mm nodule in the right lower lobe. No follow-up needed if patient is low-risk. Non-contrast chest CT can be considered in 12 months if patient is high-risk. This recommendation follows the consensus statement: Guidelines for Management of Incidental Pulmonary Nodules Detected on CT Images: From the Fleischner Society 2017; Radiology 2017; 284:228-243. 3. Left coronary artery calcifications. Aortic atherosclerosis. Emphysema. Aortic Atherosclerosis (ICD10-I70.0) and Emphysema (ICD10-J43.9). Electronically Signed   By: Lajean Manes M.D.   On: 12/29/2018 15:17        Scheduled Meds: . alfuzosin  10 mg Oral QHS  . dextromethorphan-guaiFENesin  1 tablet Oral BID  . enoxaparin (LOVENOX) injection  70 mg Subcutaneous Q24H  . ezetimibe  10 mg Oral Daily  . feeding supplement (ENSURE ENLIVE)  237 mL Oral BID BM  . levothyroxine  175 mcg Oral QAC breakfast  . loratadine  10 mg Oral Daily  . methylPREDNISolone (SOLU-MEDROL) injection  125 mg Intravenous TID  . mirabegron ER  50 mg Oral Daily  . vitamin C  500 mg Oral Daily  . zinc sulfate  220 mg Oral Daily   Continuous Infusions: . sodium chloride 250 mL (12/26/18 0912)     LOS: 6 days   The patient is critically ill with multiple organ systems failure and requires high complexity decision making for assessment and support, frequent evaluation and  titration of therapies, application of advanced monitoring technologies and extensive interpretation of multiple databases. Critical Care Time devoted to patient care services described in this note  Time spent: 40 minutes     Celie Desrochers, Geraldo Docker, MD [facility] Pager [phone]  If 7PM-7AM, please contact night-coverage [email or web address] Password [email or web address] 12/30/2018, 8:45 AM

## 2018-12-30 NOTE — Progress Notes (Signed)
Called wife, addressed concern and answered questions.

## 2018-12-31 ENCOUNTER — Inpatient Hospital Stay (HOSPITAL_COMMUNITY): Payer: Medicare HMO

## 2018-12-31 DIAGNOSIS — I4891 Unspecified atrial fibrillation: Secondary | ICD-10-CM | POA: Diagnosis present

## 2018-12-31 DIAGNOSIS — J439 Emphysema, unspecified: Secondary | ICD-10-CM

## 2018-12-31 DIAGNOSIS — R739 Hyperglycemia, unspecified: Secondary | ICD-10-CM | POA: Diagnosis present

## 2018-12-31 LAB — FERRITIN: Ferritin: 573 ng/mL — ABNORMAL HIGH (ref 24–336)

## 2018-12-31 LAB — CBC WITH DIFFERENTIAL/PLATELET
Abs Immature Granulocytes: 0.6 10*3/uL — ABNORMAL HIGH (ref 0.00–0.07)
Basophils Absolute: 0.1 10*3/uL (ref 0.0–0.1)
Basophils Relative: 0 %
Eosinophils Absolute: 0 10*3/uL (ref 0.0–0.5)
Eosinophils Relative: 0 %
HCT: 52.5 % — ABNORMAL HIGH (ref 39.0–52.0)
Hemoglobin: 17.5 g/dL — ABNORMAL HIGH (ref 13.0–17.0)
Immature Granulocytes: 4 %
Lymphocytes Relative: 5 %
Lymphs Abs: 0.8 10*3/uL (ref 0.7–4.0)
MCH: 31.5 pg (ref 26.0–34.0)
MCHC: 33.3 g/dL (ref 30.0–36.0)
MCV: 94.4 fL (ref 80.0–100.0)
Monocytes Absolute: 0.5 10*3/uL (ref 0.1–1.0)
Monocytes Relative: 4 %
Neutro Abs: 12.6 10*3/uL — ABNORMAL HIGH (ref 1.7–7.7)
Neutrophils Relative %: 87 %
Platelets: 243 10*3/uL (ref 150–400)
RBC: 5.56 MIL/uL (ref 4.22–5.81)
RDW: 13 % (ref 11.5–15.5)
WBC: 14.6 10*3/uL — ABNORMAL HIGH (ref 4.0–10.5)
nRBC: 0 % (ref 0.0–0.2)

## 2018-12-31 LAB — COMPREHENSIVE METABOLIC PANEL
ALT: 75 U/L — ABNORMAL HIGH (ref 0–44)
AST: 33 U/L (ref 15–41)
Albumin: 3.8 g/dL (ref 3.5–5.0)
Alkaline Phosphatase: 39 U/L (ref 38–126)
Anion gap: 16 — ABNORMAL HIGH (ref 5–15)
BUN: 36 mg/dL — ABNORMAL HIGH (ref 8–23)
CO2: 26 mmol/L (ref 22–32)
Calcium: 8.9 mg/dL (ref 8.9–10.3)
Chloride: 97 mmol/L — ABNORMAL LOW (ref 98–111)
Creatinine, Ser: 1.12 mg/dL (ref 0.61–1.24)
GFR calc Af Amer: >60 mL/min (ref >60)
GFR calc non Af Amer: >60 mL/min (ref >60)
Glucose, Bld: 252 mg/dL — ABNORMAL HIGH (ref 70–99)
Potassium: 4 mmol/L (ref 3.5–5.1)
Sodium: 139 mmol/L (ref 135–145)
Total Bilirubin: 1 mg/dL (ref 0.3–1.2)
Total Protein: 6.8 g/dL (ref 6.5–8.1)

## 2018-12-31 LAB — LACTATE DEHYDROGENASE: LDH: 459 U/L — ABNORMAL HIGH (ref 98–192)

## 2018-12-31 LAB — MAGNESIUM: Magnesium: 2.7 mg/dL — ABNORMAL HIGH (ref 1.7–2.4)

## 2018-12-31 LAB — GLUCOSE, CAPILLARY
Glucose-Capillary: 222 mg/dL — ABNORMAL HIGH (ref 70–99)
Glucose-Capillary: 218 mg/dL — ABNORMAL HIGH (ref 70–99)

## 2018-12-31 LAB — D-DIMER, QUANTITATIVE: D-Dimer, Quant: 0.33 ug/mL-FEU (ref 0.00–0.50)

## 2018-12-31 LAB — C-REACTIVE PROTEIN: CRP: <0.5 mg/dL (ref <1.0)

## 2018-12-31 LAB — PHOSPHORUS: Phosphorus: 3.6 mg/dL (ref 2.5–4.6)

## 2018-12-31 MED ORDER — INSULIN ASPART 100 UNIT/ML ~~LOC~~ SOLN
0.0000 [IU] | SUBCUTANEOUS | Status: DC
  Administered 2018-12-31 – 2019-01-01 (×4): 5 [IU] via SUBCUTANEOUS
  Administered 2019-01-01: 8 [IU] via SUBCUTANEOUS
  Administered 2019-01-01 (×3): 5 [IU] via SUBCUTANEOUS
  Administered 2019-01-01: 16:00:00 8 [IU] via SUBCUTANEOUS
  Administered 2019-01-02 (×5): 5 [IU] via SUBCUTANEOUS
  Administered 2019-01-03 (×2): 3 [IU] via SUBCUTANEOUS
  Administered 2019-01-03 (×2): 5 [IU] via SUBCUTANEOUS
  Administered 2019-01-03 (×2): 3 [IU] via SUBCUTANEOUS
  Administered 2019-01-04: 5 [IU] via SUBCUTANEOUS
  Administered 2019-01-04: 05:00:00 8 [IU] via SUBCUTANEOUS
  Administered 2019-01-04: 08:00:00 5 [IU] via SUBCUTANEOUS

## 2018-12-31 MED ORDER — DILTIAZEM HCL-DEXTROSE 125-5 MG/125ML-% IV SOLN (PREMIX)
5.0000 mg/h | INTRAVENOUS | Status: DC
  Filled 2018-12-31: qty 125

## 2018-12-31 MED ORDER — CHLORHEXIDINE GLUCONATE CLOTH 2 % EX PADS
6.0000 | MEDICATED_PAD | Freq: Every day | CUTANEOUS | Status: DC
  Administered 2018-12-31 – 2019-01-13 (×12): 6 via TOPICAL

## 2018-12-31 MED ORDER — NITROGLYCERIN 0.4 MG SL SUBL: 0.4000 mg | SUBLINGUAL_TABLET | SUBLINGUAL | Status: DC | PRN

## 2018-12-31 MED ORDER — NITROGLYCERIN 0.4 MG SL SUBL
SUBLINGUAL_TABLET | SUBLINGUAL | Status: AC
  Administered 2018-12-31: 12:00:00 0.4 mg via SUBLINGUAL
  Filled 2018-12-31: qty 1

## 2018-12-31 MED ORDER — ENOXAPARIN SODIUM 60 MG/0.6ML ~~LOC~~ SOLN
0.5000 mg/kg | Freq: Two times a day (BID) | SUBCUTANEOUS | Status: DC
  Administered 2018-12-31 – 2019-01-01 (×2): 60 mg via SUBCUTANEOUS
  Filled 2018-12-31 (×2): qty 0.6

## 2018-12-31 MED ORDER — METOPROLOL TARTRATE 5 MG/5ML IV SOLN
5.0000 mg | Freq: Once | INTRAVENOUS | Status: AC
  Administered 2018-12-31: 13:00:00 5 mg via INTRAVENOUS

## 2018-12-31 NOTE — Progress Notes (Signed)
RT called to Rapid Response. Pt with increased O2 needs (15L HFNC/15L NRB),  increased WOB, Tachycardia. Decision made to bring to ICU for Heated HFNC. Pt placed on 35L/100% and requested to keep NRB mask on in addition. VS within normal limts, MD giving medicine to control HR

## 2018-12-31 NOTE — Progress Notes (Signed)
Writer entered the pt room wittnessed labored breathing on HF=15L and NRB=15L. Tach sustaining in the 140-150's. Pt stated "I just not feeling well". Contacted MD notified. Contacted Rapid response. Nurse Meagan Wall was secondary nurse to assist with code. MD Clydene Laming arrived. Stat EKG order with Nitro and Metoprolol. Pt being transferred to ICU. Notified family of transfer.

## 2018-12-31 NOTE — Progress Notes (Signed)
Rapid Response Event Note     Rapid Response called for patient due to HR >130 increased WOB, and increased O2 demands. MD Sherral Hammers at the bedside. Pt is given 5mg  of metoprolol and tx'd to ICU for [place].       Arthur Daniel

## 2018-12-31 NOTE — Progress Notes (Addendum)
PROGRESS NOTE    Arthur Daniel  ZOX:[id number] DOB: January 16, 1941 DOA: 01/02/2019 PCP: Shelda Pal, DO   Brief Narrative:  Arthur Daniel is a 78 y.o WM PMHx  A. fib, pacemaker, hyperlipidemia, hypothyroidism   Presents to [facility] ED due to 3-week history of abdominal pain (epigastric) with several episodes of nonbloody diarrhea ( 4-6 daily) with last episode being this morning, as well as increased urinary frequency.  No alleviating/aggravating factor for the abdominal pain and it was associated with shortness of breath when he takes deep breath, patient also complained of nonradiating midsternal chest pain.  He denies  vomiting, he states that he has been in self-isolation at home for the past 3 weeks and denies any sick contacts.   ED Course:  He was noted to be febrile with a temperature of 100.19F, was tachypneic and hypoxic with an O2 sat of 87% dose requiring supplemental oxygen via HFNC at 8lpm.  BP was 137/65.  Work-up in the ED showed thrombocytopenia, hyponatremia, hyperglycemia, elevated transaminitis, urinalysis was normal, procalcitonin was <0.10, LDH was elevated at 177, CRP 3.9, fibrinogen 522, LDH 277, lactic acid 1.2. SARS coronavirus 2 Ag was positive.  Developing infiltrates in the lung bases. He was treated with IV Decadron 6 mg, remdesivir, Actemra and Tylenol.  Patient was transferred to [facility] for further management.   Subjective: 12./15 paged patient bedside, patient now in A. fib RVR with CP and increasing respiratory distress.  Rates CP substernal at 5/10     Assessment & Plan:   Principal Problem:   COVID-19 Active Problems:   Hypothyroidism   Essential hypertension   Cardiac pacemaker in situ   BPH (benign prostatic hyperplasia)   Anxiety   Acute respiratory failure with hypoxia (HCC)   Dyslipidemia   Pneumonia due to COVID-19 virus   Gastroenteritis due to COVID-19 virus   Thrombocytopenia (HCC)   Emphysema of lung (HCC)   Atrial fibrillation  with RVR (HCC)   Hyperglycemia   Covid pneumonia/acute respiratory failure with hypoxia COVID-19 Labs  Recent Labs    12/29/18 0252 12/30/18 1056 12/31/18 0315  DDIMER 0.62* 0.43 0.33  FERRITIN 716* 593* 573*  LDH  --   --  459*  CRP 0.6 <0.5 <0.5   12/8 SARS coronavirus positive -12/13 Decadron 6 mg daily---->Solu-Medrol 125 mg TID -Remdesivir per pharmacy protocol -Vitamins per Covid protocol -12/9 Actemra x1 dose -Vitamins per Covid protocol -Titrate O2 to maintain SPO2> 88% -Prone patient 16 hours/day; if cannot tolerate prone 2 to 3 hours per shift -12/10 PCXR; slightly worsening aeration -12/11 PCXR; infiltrates or atelectasis see results below -12/13 CT scan showed underlying emphysema (patient unaware)/severe Covid pneumonia see results below -Lasix as needed -12/15 Hold on additional Lasix see Inc creatinine below    Emphysema -Emphysema on 12/13 chest CT see below -See Covid pneumonia  A. fib RVR/CP -NTG sublingual 0.4 mg x 2 -Metoprolol IV 5 mg x 1 -Cardizem drip -Negative subclavicular emphysema appreciated on PCXR see results below  Essential HTN -12/9 patient's BP controlled without metoprolol hold at this time.  If patient's BP begins to climb would then start home medication. -Strict in and out -5.3 L -Daily weight  Filed Weights   12/28/18 0500 12/29/18 0500 12/31/18 0500  Weight: 129.5 kg 128 kg 123.4 kg   Recent Labs  Lab 12/27/18 0114 12/28/18 0149 12/29/18 0252 12/30/18 1056 12/31/18 0315  CREATININE 0.94 0.88 1.02 1.11 1.12  -See Covid pneumonia  Permanent pacer -Pacer installed secondary  to symptomatic bradycardia secondary to thyroid disease.  Only kicks in if patient's heart rate significantly decreased.  Covid gastroenteritis -Abdominal pain/diarrhea resolved most likely secondary to Covid infection -Continue Zofran as needed  BPH -Patient complained of increased urinary frequency, bladder scan done only showed 40 mL in the  bladder with no signs of retention. -Uroxatral 10 mg qhs -12/10 no further acute symptoms  Hyponatremia -Resolved  Hypokalemia -Potassium goal> 4  Elevated transaminitis AST 67, ALT 59 Continue to monitor liver panel with morning labs  Chronic thrombocytopenia -Platelets currently at 99, no obvious sign of bleeding -Continue to monitor platelets troponin labs Results for RIKU, BUTTERY (MRN [id number]) as of 12/30/2018 16:00  Ref. Range 12/26/2018 02:50 12/27/2018 01:14 12/28/2018 01:49 12/29/2018 02:52 12/30/2018 10:56  Platelets Latest Ref Range: 150 - 400 K/uL 133 (L) 159 172 178 212  -Resolved  Hypothyroidism -Synthroid 175 mcg daily   HLD -Zetia 10 mg daily  Hyperglycemia -Moderate SSI    DVT prophylaxis: Lovenox Code Status: Full Family Communication: 12/15 spoke with Sunday Spillers (wife) explained plan of care answered all questions Disposition Plan: TBD   Consultants:    Procedures/Significant Events:  12/10 PCXR;Persistent bibasilar infiltrates with slight worsening aeration 12/11 PCXR stable bibasilar atelectasis or infiltrates 12/13 CT chest WO contrast;-bilateral ground-glass airspace opacities with a lower lung predominance, consistent with extensive COVID-19 pneumonia. -2. 5 mm nodule in the right lower lobe. No follow-up needed if patient is low-risk. Non-contrast chest CT can be considered in 12 months if patient is high-risk. T - Left coronary artery calcifications. Aortic atherosclerosis. -Emphysema. 12/15 PCXR-persistent bilateral pulmonary infiltrates -Negative LEFT SUBCLAVICULAR EMPHYSEMA APPRECIATED ON PCXR (my read)   I have personally reviewed and interpreted all radiology studies and my findings are as above.  VENTILATOR SETTINGS: HFNC + NRB 12/15 Flow; 35 L/min FiO2; 100% SPO2 91%    Cultures 12/8 SARS coronavirus positive 12/8 blood LEFT wrist NGTD 12/8 blood RIGHT antecubital NGTD   Antimicrobials: Anti-infectives (From  admission, onward)   Start     Dose/Rate Stop   12/25/18 1000  remdesivir 100 mg in sodium chloride 0.9 % 100 mL IVPB  Status:  Discontinued     100 mg 200 mL/hr over 30 Minutes 12/21/2018 2257   12/25/18 1000  remdesivir 100 mg in sodium chloride 0.9 % 100 mL IVPB     100 mg 200 mL/hr over 30 Minutes 12/29/18 0959   12/27/2018 2330  remdesivir 200 mg in sodium chloride 0.9% 250 mL IVPB     200 mg 580 mL/hr over 30 Minutes 12/25/18 0029   01/14/2019 1630  remdesivir 200 mg in sodium chloride 0.9% 250 mL IVPB  Status:  Discontinued     200 mg 580 mL/hr over 30 Minutes 01/03/2019 2257       Devices    LINES / TUBES:      Continuous Infusions: . sodium chloride 250 mL (12/26/18 0912)  . diltiazem (CARDIZEM) infusion       Objective: Vitals:   12/31/18 1153 12/31/18 1307 12/31/18 1308 12/31/18 1400  BP: 91/74 120/80 114/60 107/67  Pulse: (!) 140 (!) 141  92  Resp: 20 20  (!) 22  Temp: 97.6 F (36.4 C)     TempSrc: Axillary     SpO2: 90% (!) 83%  (!) 89%  Weight:      Height:        Intake/Output Summary (Last 24 hours) at 12/31/2018 1723 Last data filed at 12/31/2018 1222 Gross per 24 hour  Intake -  Output 1175 ml  Net -1175 ml   Filed Weights   12/28/18 0500 12/29/18 0500 12/31/18 0500  Weight: 129.5 kg 128 kg 123.4 kg   Physical Exam:  General: A/O x4, positive increased acute respiratory distress Eyes: negative scleral hemorrhage, negative anisocoria, negative icterus ENT: Negative Runny nose, negative gingival bleeding, Neck:  Negative scars, masses, torticollis, lymphadenopathy, JVD Lungs: Tachycardic, diffuse crackles, without wheezes or crackles Cardiovascular: Irregular irregular rhythm and rate,(pacer left chest wall currently not being paced) without murmur gallop or rub normal S1 and S2 Abdomen: negative abdominal pain, nondistended, positive soft, bowel sounds, no rebound, no ascites, no appreciable mass Extremities: Positive cyanosis, negative  clubbing, or edema bilateral lower extremities Skin: Negative rashes, lesions, ulcers Psychiatric:  Negative depression, Positive anxiety, negative fatigue, negative mania  Central nervous system:  Cranial nerves II through XII intact, tongue/uvula midline, all extremities muscle strength 5/5, sensation intact throughout, negative dysarthria, negative expressive aphasia, negative receptive aphasia.,  .     Data Reviewed: Care during the described time interval was provided by me .  I have reviewed this patient's available data, including medical history, events of note, physical examination, and all test results as part of my evaluation.   CBC: Recent Labs  Lab 12/27/18 0114 12/28/18 0149 12/29/18 0252 12/30/18 1056 12/31/18 0315  WBC 3.7* 3.5* 4.9 9.9 14.6*  NEUTROABS 2.7 2.6 3.5 8.4* 12.6*  HGB 15.3 15.9 16.8 18.3* 17.5*  HCT 46.4 47.6 50.4 54.0* 52.5*  MCV 95.3 94.1 93.5 94.1 94.4  PLT 159 172 178 212 983   Basic Metabolic Panel: Recent Labs  Lab 12/27/18 0114 12/28/18 0149 12/29/18 0252 12/30/18 1056 12/31/18 0315  NA 136 136 137 138 139  K 4.6 3.8 3.5 3.8 4.0  CL 99 97* 99 97* 97*  CO2 '25 25 25 26 26  ' GLUCOSE 170* 185* 166* 223* 252*  BUN '20 22 23 ' 27* 36*  CREATININE 0.94 0.88 1.02 1.11 1.12  CALCIUM 8.3* 8.2* 8.3* 8.6* 8.9  MG 2.3 2.1 2.3 2.5* 2.7*  PHOS 3.0 3.2 3.7 2.8 3.6   GFR: Estimated Creatinine Clearance: 75 mL/min (by C-G formula based on SCr of 1.12 mg/dL). Liver Function Tests: Recent Labs  Lab 12/27/18 0114 12/28/18 0149 12/29/18 0252 12/30/18 1056 12/31/18 0315  AST 99* 127* 61* 36 33  ALT 89* 150* 119* 90* 75*  ALKPHOS 27* 32* 32* 36* 39  BILITOT 0.8 0.7 1.0 1.1 1.0  PROT 6.1* 6.4* 6.6 6.8 6.8  ALBUMIN 3.3* 3.4* 3.6 3.6 3.8   No results for input(s): LIPASE, AMYLASE in the last 168 hours. No results for input(s): AMMONIA in the last 168 hours. Coagulation Profile: No results for input(s): INR, PROTIME in the last 168 hours. Cardiac  Enzymes: No results for input(s): CKTOTAL, CKMB, CKMBINDEX, TROPONINI in the last 168 hours. BNP (last 3 results) No results for input(s): PROBNP in the last 8760 hours. HbA1C: No results for input(s): HGBA1C in the last 72 hours. CBG: No results for input(s): GLUCAP in the last 168 hours. Lipid Profile: No results for input(s): CHOL, HDL, LDLCALC, TRIG, CHOLHDL, LDLDIRECT in the last 72 hours. Thyroid Function Tests: No results for input(s): TSH, T4TOTAL, FREET4, T3FREE, THYROIDAB in the last 72 hours. Anemia Panel: Recent Labs    12/30/18 1056 12/31/18 0315  FERRITIN 593* 573*   Urine analysis:    Component Value Date/Time   COLORURINE YELLOW 12/28/2018 1900   APPEARANCEUR CLEAR 12/25/2018 1900   LABSPEC 1.015 12/22/2018 1900  PHURINE 6.0 01/04/2019 1900   GLUCOSEU NEGATIVE 01/06/2019 1900   HGBUR NEGATIVE 01/09/2019 1900   BILIRUBINUR NEGATIVE 12/25/2018 1900   KETONESUR NEGATIVE 01/16/2019 1900   PROTEINUR 30 (A) 01/12/2019 1900   UROBILINOGEN 1.0 10/03/2006 0054   NITRITE NEGATIVE 12/23/2018 1900   LEUKOCYTESUR NEGATIVE 12/27/2018 1900   Sepsis Labs: '@LABRCNTIP' (procalcitonin:4,lacticidven:4)  ) Recent Results (from the past 240 hour(s))  Blood culture (routine x 2)     Status: None   Collection Time: 01/03/2019  3:09 PM   Specimen: BLOOD  Result Value Ref Range Status   Specimen Description   Final    BLOOD BLOOD LEFT WRIST Performed at [facility], [address] [place], [place] [address]    Special Requests   Final    BOTTLES DRAWN AEROBIC ONLY Blood Culture adequate volume Performed at [facility], [address] [place], [place] [address]    Culture   Final    NO GROWTH 5 DAYS Performed at [facility], [place] [address], [place], [place] [address]    Report Status 12/29/2018 FINAL  Final  Blood culture (routine x 2)     Status: None   Collection Time: 01/06/2019  3:09 PM   Specimen: BLOOD  Result Value Ref Range Status    Specimen Description   Final    BLOOD RIGHT ANTECUBITAL Performed at [facility], [address] [place], [place] [address]    Special Requests   Final    BOTTLES DRAWN AEROBIC AND ANAEROBIC Blood Culture adequate volume Performed at [facility], [address] [place], [place] [address]    Culture   Final    NO GROWTH 5 DAYS Performed at [facility], [place] [address], [place], [place] [address]    Report Status 12/29/2018 FINAL  Final  SARS Coronavirus 2 Ag (30 min TAT) - Nasal Swab (BD Veritor Kit)     Status: Abnormal   Collection Time: 12/18/2018  3:16 PM   Specimen: Nasal Swab (BD Veritor Kit)  Result Value Ref Range Status   SARS Coronavirus 2 Ag POSITIVE (A) NEGATIVE Final    Comment: RESULT CALLED TO, READ BACK BY AND VERIFIED WITH: MARVA SIMMS RN AT 6568 ON 01/12/2019 BY [email or web address] (NOTE) SARS-CoV-2 antigen PRESENT. Positive results indicate the presence of viral antigens, but clinical correlation with patient history and other diagnostic information is necessary to determine patient infection status.  Positive results do not rule out bacterial infection or co-infection  with other viruses. False positive results are rare but can occur, and confirmatory RT-PCR testing may be appropriate in some circumstances. The expected result is Negative. Fact Sheet for Patients: PodPark.tn Fact Sheet for Providers: GiftContent.is  This test is not yet approved or cleared by the [place] FDA and  has been authorized for detection and/or diagnosis of SARS-CoV-2 by FDA under an Emergency Use Authorization (EUA).  This EUA will remain in effect (meaning this test can be used) for the duration of  the COVID-1 9 declaration under Section 564(b)(1) of the Act, 21 U.S.C. section 360bbb-3(b)(1), unless the authorization is terminated or revoked sooner. Performed at [facility], [address] [place], [place] [address]          Radiology Studies: DG CHEST PORT 1 VIEW  Result Date: 12/31/2018 CLINICAL DATA:  COVID-19 EXAM: PORTABLE CHEST 1 VIEW COMPARISON:  Portable exam 1307 hours compared to 12/27/2018 FINDINGS:  LEFT subclavian sequential transvenous pacemaker leads project at RIGHT atrium and RIGHT ventricle. Normal heart size, mediastinal contours, and pulmonary vascularity. Atherosclerotic calcification aorta. Patchy airspace infiltrates bilaterally consistent with multifocal pneumonia and history of COVID. No pleural effusion or pneumothorax. Bones demineralized. IMPRESSION: Persistent BILATERAL pulmonary infiltrates consistent with history of COVID-19. Electronically Signed   By: Lavonia Dana M.D.   On: 12/31/2018 13:39        Scheduled Meds: . alfuzosin  10 mg Oral QHS  . Chlorhexidine Gluconate Cloth  6 each Topical Daily  . dextromethorphan-guaiFENesin  1 tablet Oral BID  . enoxaparin (LOVENOX) injection  0.5 mg/kg Subcutaneous Q12H  . ezetimibe  10 mg Oral Daily  . feeding supplement (ENSURE ENLIVE)  237 mL Oral BID BM  . insulin aspart  0-15 Units Subcutaneous Q4H  . levothyroxine  175 mcg Oral QAC breakfast  . loratadine  10 mg Oral Daily  . methylPREDNISolone (SOLU-MEDROL) injection  125 mg Intravenous TID  . mirabegron ER  50 mg Oral Daily  . vitamin C  500 mg Oral Daily  . zinc sulfate  220 mg Oral Daily   Continuous Infusions: . sodium chloride 250 mL (12/26/18 0912)  . diltiazem (CARDIZEM) infusion       LOS: 7 days   The patient is critically ill with multiple organ systems failure and requires high complexity decision making for assessment and support, frequent evaluation and titration of therapies, application of advanced monitoring technologies and extensive interpretation of multiple databases. Critical Care Time devoted to patient care services described in this note  Time spent: 40 minutes     Enslie Sahota, Geraldo Docker, MD [facility] Pager [phone]  If 7PM-7AM, please contact night-coverage [email or web address] Password [facility] 12/31/2018, 5:23 PM

## 2018-12-31 NOTE — Progress Notes (Signed)
Informed family of pt transfer to ICU.   12/31/18 1240  Family/Significant Other Communication  Family/Significant Other Update Called

## 2019-01-01 LAB — CBC WITH DIFFERENTIAL/PLATELET
Abs Immature Granulocytes: 0.81 10*3/uL — ABNORMAL HIGH (ref 0.00–0.07)
Basophils Absolute: 0.1 10*3/uL (ref 0.0–0.1)
Basophils Relative: 0 %
Eosinophils Absolute: 0 10*3/uL (ref 0.0–0.5)
Eosinophils Relative: 0 %
HCT: 54.8 % — ABNORMAL HIGH (ref 39.0–52.0)
Hemoglobin: 18 g/dL — ABNORMAL HIGH (ref 13.0–17.0)
Immature Granulocytes: 6 %
Lymphocytes Relative: 5 %
Lymphs Abs: 0.6 10*3/uL — ABNORMAL LOW (ref 0.7–4.0)
MCH: 31 pg (ref 26.0–34.0)
MCHC: 32.8 g/dL (ref 30.0–36.0)
MCV: 94.5 fL (ref 80.0–100.0)
Monocytes Absolute: 0.3 10*3/uL (ref 0.1–1.0)
Monocytes Relative: 3 %
Neutro Abs: 11.8 10*3/uL — ABNORMAL HIGH (ref 1.7–7.7)
Neutrophils Relative %: 86 %
Platelets: 282 10*3/uL (ref 150–400)
RBC: 5.8 MIL/uL (ref 4.22–5.81)
RDW: 13.1 % (ref 11.5–15.5)
WBC: 13.6 10*3/uL — ABNORMAL HIGH (ref 4.0–10.5)
nRBC: 0.1 % (ref 0.0–0.2)

## 2019-01-01 LAB — FERRITIN: Ferritin: 548 ng/mL — ABNORMAL HIGH (ref 24–336)

## 2019-01-01 LAB — COMPREHENSIVE METABOLIC PANEL
ALT: 72 U/L — ABNORMAL HIGH (ref 0–44)
AST: 36 U/L (ref 15–41)
Albumin: 3.7 g/dL (ref 3.5–5.0)
Alkaline Phosphatase: 44 U/L (ref 38–126)
Anion gap: 16 — ABNORMAL HIGH (ref 5–15)
BUN: 43 mg/dL — ABNORMAL HIGH (ref 8–23)
CO2: 26 mmol/L (ref 22–32)
Calcium: 8.8 mg/dL — ABNORMAL LOW (ref 8.9–10.3)
Chloride: 97 mmol/L — ABNORMAL LOW (ref 98–111)
Creatinine, Ser: 1.24 mg/dL (ref 0.61–1.24)
GFR calc Af Amer: >60 mL/min (ref >60)
GFR calc non Af Amer: 56 mL/min — ABNORMAL LOW (ref >60)
Glucose, Bld: 214 mg/dL — ABNORMAL HIGH (ref 70–99)
Potassium: 4.1 mmol/L (ref 3.5–5.1)
Sodium: 139 mmol/L (ref 135–145)
Total Bilirubin: 1.2 mg/dL (ref 0.3–1.2)
Total Protein: 6.7 g/dL (ref 6.5–8.1)

## 2019-01-01 LAB — MRSA PCR SCREENING: MRSA by PCR: NEGATIVE

## 2019-01-01 LAB — GLUCOSE, CAPILLARY
Glucose-Capillary: 217 mg/dL — ABNORMAL HIGH (ref 70–99)
Glucose-Capillary: 223 mg/dL — ABNORMAL HIGH (ref 70–99)
Glucose-Capillary: 235 mg/dL — ABNORMAL HIGH (ref 70–99)
Glucose-Capillary: 248 mg/dL — ABNORMAL HIGH (ref 70–99)
Glucose-Capillary: 276 mg/dL — ABNORMAL HIGH (ref 70–99)
Glucose-Capillary: 280 mg/dL — ABNORMAL HIGH (ref 70–99)

## 2019-01-01 LAB — PHOSPHORUS: Phosphorus: 4.7 mg/dL — ABNORMAL HIGH (ref 2.5–4.6)

## 2019-01-01 LAB — MAGNESIUM: Magnesium: 2.8 mg/dL — ABNORMAL HIGH (ref 1.7–2.4)

## 2019-01-01 LAB — C-REACTIVE PROTEIN: CRP: <0.5 mg/dL (ref <1.0)

## 2019-01-01 LAB — LACTATE DEHYDROGENASE: LDH: 458 U/L — ABNORMAL HIGH (ref 98–192)

## 2019-01-01 LAB — D-DIMER, QUANTITATIVE: D-Dimer, Quant: 0.31 ug/mL-FEU (ref 0.00–0.50)

## 2019-01-01 MED ORDER — ENSURE ENLIVE PO LIQD
237.0000 mL | Freq: Three times a day (TID) | ORAL | Status: DC
  Administered 2019-01-01 – 2019-01-08 (×18): 237 mL via ORAL
  Filled 2019-01-01 (×14): qty 237

## 2019-01-01 MED ORDER — METOPROLOL TARTRATE 25 MG PO TABS
25.0000 mg | ORAL_TABLET | Freq: Two times a day (BID) | ORAL | Status: DC
  Administered 2019-01-01 – 2019-01-08 (×14): 25 mg via ORAL
  Filled 2019-01-01 (×15): qty 1

## 2019-01-01 MED ORDER — ENOXAPARIN SODIUM 120 MG/0.8ML ~~LOC~~ SOLN
120.0000 mg | Freq: Two times a day (BID) | SUBCUTANEOUS | Status: DC
  Administered 2019-01-01 – 2019-01-09 (×16): 120 mg via SUBCUTANEOUS
  Filled 2019-01-01 (×19): qty 0.8

## 2019-01-01 MED ORDER — ORAL CARE MOUTH RINSE
15.0000 mL | Freq: Two times a day (BID) | OROMUCOSAL | Status: DC
  Administered 2019-01-01 – 2019-01-12 (×20): 15 mL via OROMUCOSAL

## 2019-01-01 MED ORDER — CHLORHEXIDINE GLUCONATE 0.12 % MT SOLN
15.0000 mL | Freq: Two times a day (BID) | OROMUCOSAL | Status: DC
  Administered 2019-01-01 – 2019-01-13 (×25): 15 mL via OROMUCOSAL
  Filled 2019-01-01 (×23): qty 15

## 2019-01-01 NOTE — Progress Notes (Signed)
NOK called and was updated of patient's present condition. All questions were answered.

## 2019-01-01 NOTE — Progress Notes (Addendum)
ANTICOAGULATION CONSULT NOTE - Initial Consult  Pharmacy Consult for Lovenox Indication: atrial fibrillation  Allergies  Allergen Reactions  . Lipitor [Atorvastatin] Other (See Comments)    Breast lumps    Patient Measurements: Height: 5\' 11"  (180.3 cm) Weight: 269 lb 2.9 oz (122.1 kg) IBW/kg (Calculated) : 75.3  Vital Signs: Temp: 97.9 F (36.6 C) (12/16 1200) Temp Source: Oral (12/16 1200) BP: 118/62 (12/16 1500) Pulse Rate: 94 (12/16 1500)  Labs: Recent Labs    12/30/18 1056 12/31/18 0315 01/01/19 0500  HGB 18.3* 17.5* 18.0*  HCT 54.0* 52.5* 54.8*  PLT 212 243 282  CREATININE 1.11 1.12 1.24    Estimated Creatinine Clearance: 67.4 mL/min (by C-G formula based on SCr of 1.24 mg/dL).   Medical History: Past Medical History:  Diagnosis Date  . 10 year risk of MI or stroke 7.5% or greater 04/28/2016  . Allergic rhinitis, cause unspecified   . BPH (benign prostatic hyperplasia) 10/18/2015  . Diverticulosis   . ED (erectile dysfunction)   . Essential hypertension, benign   . Hernia, umbilical   . Lump or mass in breast   . Mixed hyperlipidemia   . Obesity   . Pacemaker    [place], [place] 2010  . Postablative hypothyroidism   . Sleep apnea    Use CPAP machine  . Ureteral stone    Dr. Risa Grill    Medications:  Scheduled:  . alfuzosin  10 mg Oral QHS  . chlorhexidine  15 mL Mouth Rinse BID  . Chlorhexidine Gluconate Cloth  6 each Topical Daily  . dextromethorphan-guaiFENesin  1 tablet Oral BID  . enoxaparin (LOVENOX) injection  0.5 mg/kg Subcutaneous Q12H  . ezetimibe  10 mg Oral Daily  . feeding supplement (ENSURE ENLIVE)  237 mL Oral TID BM  . insulin aspart  0-15 Units Subcutaneous Q4H  . levothyroxine  175 mcg Oral QAC breakfast  . loratadine  10 mg Oral Daily  . mouth rinse  15 mL Mouth Rinse q12n4p  . methylPREDNISolone (SOLU-MEDROL) injection  125 mg Intravenous TID  . mirabegron ER  50 mg Oral Daily  . vitamin C  500 mg Oral Daily  . zinc sulfate   220 mg Oral Daily   Infusions:  . sodium chloride 250 mL (12/26/18 0912)  . diltiazem (CARDIZEM) infusion     PRN: sodium chloride, acetaminophen, ALPRAZolam, chlorpheniramine-HYDROcodone, guaiFENesin-dextromethorphan, nitroGLYCERIN, ondansetron (ZOFRAN) IV  Assessment: 78 yo male admitted 01/12/2019 with acute hypoxic respiratory failure in the setting of COVID-19 viral illness. Transferred to ICU 12/31/2018 for increased O2 needs, chest pain and Afib with RVR.  Pharmacy consulted for full dose Lovenox, has been receiving prophylactic dose 0.5mg /kg q12h.  Weight 122kg SCr 1.24 CrCl 67 ml/min Hgb 18 Plts 282  Goal of Therapy:  Anti-Xa level 0.6-1 units/ml 4hrs after LMWH dose given Monitor platelets by anticoagulation protocol: Yes   Plan:  Increase Lovenox to 1mg /kg SQ q12h Monitor renal function, CBC, signs/symptoms of bleeding  Peggyann Juba, PharmD, BCPS Pharmacy: [phone] 01/01/2019,3:47 PM

## 2019-01-01 NOTE — Progress Notes (Signed)
PROGRESS NOTE  Arthur Daniel:[id number] DOB: 10-26-1940 DOA: 12/29/2018 PCP: Shelda Pal, DO   LOS: 8 days   Brief Narrative / Interim history: 78 year old male with history of hyperlipidemia, Graves' disease status post treatment now hypothyroid, pacemaker, who was admitted to the hospital on 01/09/2019 with acute hypoxic respiratory failure in the setting of COVID-19 viral illness.  Subjective / 24h Interval events: He is feeling better than he was last night.  Over the last 24 hours he developed A. fib with RVR, chest pain and had to be transferred to the ICU  Assessment & Plan:  Principal Problem Acute Hypoxic Respiratory Failure due to Covid-19 Viral Illness  FiO2 (%):  [100 %] 100 %   -Patient requiring heated high flow, continue to maintain O2 sats above 88%, continue ICU care -He completed a course of remdesivir, currently receiving steroids, he also received Actemra -Inflammatory markers improving as below   COVID-19 Labs  Recent Labs    12/30/18 1056 12/31/18 0315 01/01/19 0500  DDIMER 0.43 0.33 0.31  FERRITIN 593* 573* 548*  LDH  --  459* 458*  CRP <0.5 <0.5 <0.5   No results found for: SARSCOV2NAA  Active Problems A. fib with RVR -Patient has had few episodes of A. fib in the setting of Graves' disease, he saw cardiology as an outpatient who given very temporary A. fib episodes, and the fact that his Graves' disease has been treated did not recommend anticoagulation -He now has recurrent A. fib with RVR, and he will be appropriate for anticoagulation, I have discussed with patient he is agreeable.  No history of bleeding. -He has converted back to sinus, no longer needed Cardizem infusion -Resume home metoprolol  Essential hypertension -Resume home metoprolol,  Hypothyroidism, history of Graves' disease -Continue Synthroid  Hyperlipidemia -Continue Zetia   Scheduled Meds: . alfuzosin  10 mg Oral QHS  . chlorhexidine  15 mL Mouth  Rinse BID  . Chlorhexidine Gluconate Cloth  6 each Topical Daily  . dextromethorphan-guaiFENesin  1 tablet Oral BID  . enoxaparin (LOVENOX) injection  0.5 mg/kg Subcutaneous Q12H  . ezetimibe  10 mg Oral Daily  . feeding supplement (ENSURE ENLIVE)  237 mL Oral TID BM  . insulin aspart  0-15 Units Subcutaneous Q4H  . levothyroxine  175 mcg Oral QAC breakfast  . loratadine  10 mg Oral Daily  . mouth rinse  15 mL Mouth Rinse q12n4p  . methylPREDNISolone (SOLU-MEDROL) injection  125 mg Intravenous TID  . mirabegron ER  50 mg Oral Daily  . vitamin C  500 mg Oral Daily  . zinc sulfate  220 mg Oral Daily   Continuous Infusions: . sodium chloride 250 mL (12/26/18 0912)  . diltiazem (CARDIZEM) infusion     PRN Meds:.sodium chloride, acetaminophen, ALPRAZolam, chlorpheniramine-HYDROcodone, guaiFENesin-dextromethorphan, nitroGLYCERIN, ondansetron (ZOFRAN) IV  DVT prophylaxis: Lovenox Code Status: Full code Family Communication: Discussed with patient Disposition Plan: Home when ready  Consultants:  None  Procedures:  None   Microbiology: None   Antimicrobials: None    Objective: Vitals:   01/01/19 1200 01/01/19 1300 01/01/19 1400 01/01/19 1500  BP: 132/62 127/72 (!) 117/55 118/62  Pulse: 95 97 87 94  Resp: (!) 22 (!) 21 (!) 24 (!) 23  Temp: 97.9 F (36.6 C)     TempSrc: [place]     SpO2: 90% 94% 92% 92%  Weight:      Height:        Intake/Output Summary (Last 24 hours) at 01/01/2019 1540  Last data filed at 01/01/2019 1457 Gross per 24 hour  Intake 1200 ml  Output 1245 ml  Net -45 ml   Filed Weights   12/29/18 0500 12/31/18 0500 01/01/19 0431  Weight: 128 kg 123.4 kg 122.1 kg    Examination:  Constitutional: NAD Eyes: no scleral icterus ENMT: Mucous membranes are moist.  Neck: normal, supple Respiratory: clear to auscultation bilaterally, no wheezing, no crackles. Normal respiratory effort. No accessory muscle use.  Cardiovascular: Regular rate and rhythm, no  murmurs / rubs / gallops. No LE edema. Good peripheral pulses Abdomen: non distended, no tenderness. Bowel sounds positive.  Musculoskeletal: no clubbing / cyanosis.  Skin: no rashes Neurologic: CN 2-12 grossly intact. Strength 5/5 in all 4.  Psychiatric: Normal judgment and insight. Alert and oriented x 3. Normal mood.    Data Reviewed: I have independently reviewed following labs and imaging studies   CBC: Recent Labs  Lab 12/28/18 0149 12/29/18 0252 12/30/18 1056 12/31/18 0315 01/01/19 0500  WBC 3.5* 4.9 9.9 14.6* 13.6*  NEUTROABS 2.6 3.5 8.4* 12.6* 11.8*  HGB 15.9 16.8 18.3* 17.5* 18.0*  HCT 47.6 50.4 54.0* 52.5* 54.8*  MCV 94.1 93.5 94.1 94.4 94.5  PLT 172 178 212 243 010   Basic Metabolic Panel: Recent Labs  Lab 12/28/18 0149 12/29/18 0252 12/30/18 1056 12/31/18 0315 01/01/19 0500  NA 136 137 138 139 139  K 3.8 3.5 3.8 4.0 4.1  CL 97* 99 97* 97* 97*  CO2 _0 GLUCOSE 185* 166* 223* 252* 214*  BUN 22 23 27* 36* 43*  CREATININE 0.88 1.02 1.11 1.12 1.24  CALCIUM 8.2* 8.3* 8.6* 8.9 8.8*  MG 2.1 2.3 2.5* 2.7* 2.8*  PHOS 3.2 3.7 2.8 3.6 4.7*   GFR: Estimated Creatinine Clearance: 67.4 mL/min (by C-G formula based on SCr of 1.24 mg/dL). Liver Function Tests: Recent Labs  Lab 12/28/18 0149 12/29/18 0252 12/30/18 1056 12/31/18 0315 01/01/19 0500  AST 127* 61* 36 33 36  ALT 150* 119* 90* 75* 72*  ALKPHOS 32* 32* 36* 39 44  BILITOT 0.7 1.0 1.1 1.0 1.2  PROT 6.4* 6.6 6.8 6.8 6.7  ALBUMIN 3.4* 3.6 3.6 3.8 3.7   No results for input(s): LIPASE, AMYLASE in the last 168 hours. No results for input(s): AMMONIA in the last 168 hours. Coagulation Profile: No results for input(s): INR, PROTIME in the last 168 hours. Cardiac Enzymes: No results for input(s): CKTOTAL, CKMB, CKMBINDEX, TROPONINI in the last 168 hours. BNP (last 3 results) No results for input(s): PROBNP in the last 8760 hours. HbA1C: No results for input(s): HGBA1C in the last 72  hours. CBG: Recent Labs  Lab 12/31/18 1733 12/31/18 2110 01/01/19 0011 01/01/19 0420 01/01/19 0757  GLUCAP 222* 218* 235* 217* 223*   Lipid Profile: No results for input(s): CHOL, HDL, LDLCALC, TRIG, CHOLHDL, LDLDIRECT in the last 72 hours. Thyroid Function Tests: No results for input(s): TSH, T4TOTAL, FREET4, T3FREE, THYROIDAB in the last 72 hours. Anemia Panel: Recent Labs    12/31/18 0315 01/01/19 0500  FERRITIN 573* 548*   Urine analysis:    Component Value Date/Time   COLORURINE YELLOW 12/30/2018 1900   APPEARANCEUR CLEAR 01/06/2019 1900   LABSPEC 1.015 01/08/2019 1900   PHURINE 6.0 12/30/2018 1900   GLUCOSEU NEGATIVE 01/10/2019 1900   HGBUR NEGATIVE 12/28/2018 1900   BILIRUBINUR NEGATIVE 12/17/2018 1900   KETONESUR NEGATIVE 12/22/2018 1900   PROTEINUR 30 (A) 01/02/2019 1900   UROBILINOGEN 1.0 10/03/2006 0054   NITRITE  NEGATIVE 01/09/2019 1900   LEUKOCYTESUR NEGATIVE 12/19/2018 1900   Sepsis Labs: Invalid input(s): PROCALCITONIN, LACTICIDVEN  Recent Results (from the past 240 hour(s))  Blood culture (routine x 2)     Status: None   Collection Time: 12/21/2018  3:09 PM   Specimen: BLOOD  Result Value Ref Range Status   Specimen Description   Final    BLOOD BLOOD LEFT WRIST Performed at [facility], [address] [place], [place] [address]    Special Requests   Final    BOTTLES DRAWN AEROBIC ONLY Blood Culture adequate volume Performed at [facility], [address] [place], [place] [address]    Culture   Final    NO GROWTH 5 DAYS Performed at [facility], [place] [address], [place], [place] [address]    Report Status 12/29/2018 FINAL  Final  Blood culture (routine x 2)     Status: None   Collection Time: 12/19/2018  3:09 PM   Specimen: BLOOD  Result Value Ref Range Status   Specimen Description   Final    BLOOD RIGHT ANTECUBITAL Performed at [facility], [address] [place], [place] [address]    Special  Requests   Final    BOTTLES DRAWN AEROBIC AND ANAEROBIC Blood Culture adequate volume Performed at [facility], [address] [place], [place] [address]    Culture   Final    NO GROWTH 5 DAYS Performed at [facility], [place] [address] [place], [place] [address]    Report Status 12/29/2018 FINAL  Final  SARS Coronavirus 2 Ag (30 min TAT) - Nasal Swab (BD Veritor Kit)     Status: Abnormal   Collection Time: 12/19/2018  3:16 PM   Specimen: Nasal Swab (BD Veritor Kit)  Result Value Ref Range Status   SARS Coronavirus 2 Ag POSITIVE (A) NEGATIVE Final    Comment: RESULT CALLED TO, READ BACK BY AND VERIFIED WITH: MARVA SIMMS RN AT 2119 ON 12/19/2018 BY [email or web address] (NOTE) SARS-CoV-2 antigen PRESENT. Positive results indicate the presence of viral antigens, but clinical correlation with patient history and other diagnostic information is necessary to determine patient infection status.  Positive results do not rule out bacterial infection or co-infection  with other viruses. False positive results are rare but can occur, and confirmatory RT-PCR testing may be appropriate in some circumstances. The expected result is Negative. Fact Sheet for Patients: PodPark.tn Fact Sheet for Providers: GiftContent.is  This test is not yet approved or cleared by the [place] FDA and  has been authorized for detection and/or diagnosis of SARS-CoV-2 by FDA under an Emergency Use Authorization (EUA).  This EUA will remain in effect (meaning this test can be used) for the duration of  the COVID-1 9 declaration under Section 564(b)(1) of the Act, 21 U.S.C. section 360bbb-3(b)(1), unless the authorization is terminated or revoked sooner. Performed at [facility], [address] [place], [place] [address]   MRSA PCR Screening     Status: None   Collection Time: 01/01/19  5:13 AM   Specimen: Nasal Mucosa; Nasopharyngeal   Result Value Ref Range Status   MRSA by PCR NEGATIVE NEGATIVE Final    Comment:        The GeneXpert MRSA Assay (FDA approved for NASAL specimens only), is one component of a comprehensive MRSA colonization surveillance program. It is not intended to diagnose MRSA infection nor to guide  or monitor treatment for MRSA infections. Performed at [facility], [place] [address] [place], [place] [address]       Radiology Studies: DG CHEST PORT 1 VIEW  Result Date: 12/31/2018 CLINICAL DATA:  COVID-19 EXAM: PORTABLE CHEST 1 VIEW COMPARISON:  Portable exam 1307 hours compared to 12/27/2018 FINDINGS: LEFT subclavian sequential transvenous pacemaker leads project at RIGHT atrium and RIGHT ventricle. Normal heart size, mediastinal contours, and pulmonary vascularity. Atherosclerotic calcification aorta. Patchy airspace infiltrates bilaterally consistent with multifocal pneumonia and history of COVID. No pleural effusion or pneumothorax. Bones demineralized. IMPRESSION: Persistent BILATERAL pulmonary infiltrates consistent with history of COVID-19. Electronically Signed   By: Lavonia Dana M.D.   On: 12/31/2018 13:39    Marzetta Board, MD, PhD [facility]  Contact via  [email or web address]  [place] P: [phone] F: [phone]

## 2019-01-01 NOTE — Progress Notes (Signed)
Nutrition Follow-up  RD working remotely.  DOCUMENTATION CODES:   Morbid obesity  INTERVENTION:   Recommend liberalizing diet to regular.  Continue Ensure Enlive, increase to TID, each supplement provides 350 kcal and 20 grams of protein.  Continue Hormel Shake daily with Breakfast which provides 520 kcals and 22 g of protein and Magic cup BID with lunch and dinner, each supplement provides 290 kcal and 9 grams of protein, automatically on meal trays to optimize nutritional intake.   NUTRITION DIAGNOSIS:   Increased nutrient needs related to catabolic [id number] as evidenced by estimated needs.  Ongoing   GOAL:   Patient will meet greater than or equal to 90% of their needs  Unmet  MONITOR:   PO intake, Supplement acceptance, Labs, Weight trends, I & O's  ASSESSMENT:   78 year old male with PMHx of HLD, HTN, postablative hypothyroidism, diverticulosis, sleep apnea, A-fib, hx pacemaker placement in 2010 who presented after 3 weeks of epigastric pain and non-bloody diarrhea (4-6 episodes daily) and shortness of breath found to have COVID-19 viral infection.   Meal intakes documented at 100% x 2 meals 12/10 and 10% of one meal on 12/11. No other data available. He is being offered Ensure Enlive supplements BID between meals; has been drinking 1-2 per day. Appetite documented as fair.   Patient transferred to the ICU 12/15 for heated HFNC as well as NRB mask. Suspect intake will be poor with increased difficulty breathing. Currently receiving 30 L heated HFNC and 15 L NRB.   Medications reviewed and include Novolog, Solu-medrol, vitamin C, zinc sulfate.  Labs reviewed. Phosphorus 4.7 (H), magnesium 2.8 (H) CBG's: [phone]  Admission weight 143.8 kg (12/8) Current weight 122.1 kg (12/16) I/O -5.5 L  Diet Order:   Diet Order            Diet heart healthy/carb modified Room service appropriate? Yes; Fluid consistency: Thin  Diet effective now              EDUCATION NEEDS:   No education needs have been identified at this time  Skin:  Skin Assessment: Reviewed RN Assessment  Last BM:  12/13  Height:   Ht Readings from Last 1 Encounters:  01/10/2019 5\' 11"  (1.803 m)   Weight:   Wt Readings from Last 1 Encounters:  01/01/19 122.1 kg   Ideal Body Weight:  78.2 kg  BMI:  Body mass index is 37.54 kg/m.  Estimated Nutritional Needs:   Kcal:  [id number]  Protein:  >/= 145 grams  Fluid:  2.3 L/day   Molli Barrows, RD, LDN, [place] Pager [phone] After Hours Pager [phone]

## 2019-01-01 NOTE — Progress Notes (Signed)
Inpatient Diabetes Program Recommendations  AACE/ADA: New Consensus Statement on Inpatient Glycemic Control (2015)  Target Ranges:  Prepandial:   less than 140 mg/dL      Peak postprandial:   less than 180 mg/dL (1-2 hours)      Critically ill patients:  140 - 180 mg/dL   Lab Results  Component Value Date   GLUCAP 280 (H) 01/01/2019   HGBA1C 6.2 11/25/2018    Review of Glycemic Control  Diabetes history: None Outpatient Diabetes medications: None Current orders for Inpatient glycemic control: Novolog 0-15 units Q4H.   HgbA1C - 6.2% - Pre-diabetes Solumedrol 125 mg tidwc  Inpatient Diabetes Program Recommendations:     Add Novolog 4 units tidwc Change s/s to Novolog 0-15 units tidwc and 0-5 units QHS  Will follow closely.  Thank you. Lorenda Peck, RD, LDN, CDE Inpatient Diabetes Coordinator [phone]

## 2019-01-01 NOTE — Progress Notes (Signed)
Family update Spoke with wife of patient today at 1400 to give update on patient's condition.  Also answered all questions that family had while on phone.  Encouraged any calls back at a later time if any new questions were thought of.  Wife was appreciative of communication.

## 2019-01-02 LAB — COMPREHENSIVE METABOLIC PANEL
ALT: 83 U/L — ABNORMAL HIGH (ref 0–44)
AST: 42 U/L — ABNORMAL HIGH (ref 15–41)
Albumin: 3.4 g/dL — ABNORMAL LOW (ref 3.5–5.0)
Alkaline Phosphatase: 46 U/L (ref 38–126)
Anion gap: 16 — ABNORMAL HIGH (ref 5–15)
BUN: 42 mg/dL — ABNORMAL HIGH (ref 8–23)
CO2: 25 mmol/L (ref 22–32)
Calcium: 8.6 mg/dL — ABNORMAL LOW (ref 8.9–10.3)
Chloride: 96 mmol/L — ABNORMAL LOW (ref 98–111)
Creatinine, Ser: 1.22 mg/dL (ref 0.61–1.24)
GFR calc Af Amer: >60 mL/min (ref >60)
GFR calc non Af Amer: 57 mL/min — ABNORMAL LOW (ref >60)
Glucose, Bld: 220 mg/dL — ABNORMAL HIGH (ref 70–99)
Potassium: 3.9 mmol/L (ref 3.5–5.1)
Sodium: 137 mmol/L (ref 135–145)
Total Bilirubin: 0.8 mg/dL (ref 0.3–1.2)
Total Protein: 6.2 g/dL — ABNORMAL LOW (ref 6.5–8.1)

## 2019-01-02 LAB — LACTATE DEHYDROGENASE: LDH: 447 U/L — ABNORMAL HIGH (ref 98–192)

## 2019-01-02 LAB — GLUCOSE, CAPILLARY
Glucose-Capillary: 264 mg/dL — ABNORMAL HIGH (ref 70–99)
Glucose-Capillary: 229 mg/dL — ABNORMAL HIGH (ref 70–99)
Glucose-Capillary: 203 mg/dL — ABNORMAL HIGH (ref 70–99)
Glucose-Capillary: 208 mg/dL — ABNORMAL HIGH (ref 70–99)
Glucose-Capillary: 217 mg/dL — ABNORMAL HIGH (ref 70–99)
Glucose-Capillary: 243 mg/dL — ABNORMAL HIGH (ref 70–99)

## 2019-01-02 LAB — CBC WITH DIFFERENTIAL/PLATELET
Abs Immature Granulocytes: 0.94 10*3/uL — ABNORMAL HIGH (ref 0.00–0.07)
Basophils Absolute: 0 10*3/uL (ref 0.0–0.1)
Basophils Relative: 0 %
Eosinophils Absolute: 0 10*3/uL (ref 0.0–0.5)
Eosinophils Relative: 0 %
HCT: 53.5 % — ABNORMAL HIGH (ref 39.0–52.0)
Hemoglobin: 18 g/dL — ABNORMAL HIGH (ref 13.0–17.0)
Immature Granulocytes: 6 %
Lymphocytes Relative: 4 %
Lymphs Abs: 0.6 10*3/uL — ABNORMAL LOW (ref 0.7–4.0)
MCH: 31.7 pg (ref 26.0–34.0)
MCHC: 33.6 g/dL (ref 30.0–36.0)
MCV: 94.4 fL (ref 80.0–100.0)
Monocytes Absolute: 0.4 10*3/uL (ref 0.1–1.0)
Monocytes Relative: 3 %
Neutro Abs: 13.4 10*3/uL — ABNORMAL HIGH (ref 1.7–7.7)
Neutrophils Relative %: 87 %
Platelets: 223 10*3/uL (ref 150–400)
RBC: 5.67 MIL/uL (ref 4.22–5.81)
RDW: 13 % (ref 11.5–15.5)
WBC: 15.4 10*3/uL — ABNORMAL HIGH (ref 4.0–10.5)
nRBC: 0.2 % (ref 0.0–0.2)

## 2019-01-02 LAB — PHOSPHORUS: Phosphorus: 4.7 mg/dL — ABNORMAL HIGH (ref 2.5–4.6)

## 2019-01-02 LAB — MAGNESIUM: Magnesium: 2.8 mg/dL — ABNORMAL HIGH (ref 1.7–2.4)

## 2019-01-02 LAB — FERRITIN: Ferritin: 538 ng/mL — ABNORMAL HIGH (ref 24–336)

## 2019-01-02 LAB — C-REACTIVE PROTEIN: CRP: <0.5 mg/dL (ref <1.0)

## 2019-01-02 LAB — D-DIMER, QUANTITATIVE: D-Dimer, Quant: 0.3 ug/mL-FEU (ref 0.00–0.50)

## 2019-01-02 MED ORDER — METHYLPREDNISOLONE SODIUM SUCC 125 MG IJ SOLR
60.0000 mg | Freq: Two times a day (BID) | INTRAMUSCULAR | Status: DC
  Administered 2019-01-02 – 2019-01-04 (×5): 60 mg via INTRAVENOUS
  Filled 2019-01-02 (×5): qty 2

## 2019-01-02 NOTE — Plan of Care (Signed)
Unable to prone due body habitus. Continues on HFNC heated 100% fio2 with NRB 15L. Sats overnight around 88-95.  Problem: Education: Goal: Knowledge of General Education information will improve Description: Including pain rating scale, medication(s)/side effects and non-pharmacologic comfort measures Outcome: Progressing   Problem: Health Behavior/Discharge Planning: Goal: Ability to manage health-related needs will improve Outcome: Progressing   Problem: Clinical Measurements: Goal: Ability to maintain clinical measurements within normal limits will improve Outcome: Progressing Goal: Will remain free from infection Outcome: Progressing Goal: Diagnostic test results will improve Outcome: Progressing Goal: Respiratory complications will improve Outcome: Progressing Goal: Cardiovascular complication will be avoided Outcome: Progressing   Problem: Activity: Goal: Risk for activity intolerance will decrease Outcome: Progressing   Problem: Nutrition: Goal: Adequate nutrition will be maintained Outcome: Progressing   Problem: Coping: Goal: Level of anxiety will decrease Outcome: Progressing   Problem: Elimination: Goal: Will not experience complications related to bowel motility Outcome: Progressing Goal: Will not experience complications related to urinary retention Outcome: Progressing   Problem: Pain Managment: Goal: General experience of comfort will improve Outcome: Progressing   Problem: Safety: Goal: Ability to remain free from injury will improve Outcome: Progressing   Problem: Skin Integrity: Goal: Risk for impaired skin integrity will decrease Outcome: Progressing

## 2019-01-02 NOTE — Progress Notes (Signed)
01/02/2019 spoke with patient's wife and gave update on patient's condition.  Answered all questions concerning patient.

## 2019-01-02 NOTE — Progress Notes (Addendum)
PROGRESS NOTE  Arthur Daniel EYC:[id number] DOB: 1940-08-06 DOA: 01/02/2019 PCP: Shelda Pal, DO   LOS: 9 days   Brief Narrative / Interim history: 78 year old male with history of hyperlipidemia, Graves' disease status post treatment now hypothyroid, pacemaker, who was admitted to the hospital on 01/09/2019 with acute hypoxic respiratory failure in the setting of COVID-19 viral illness.  He was treated with remdesivir, steroids, Actemra.  He was stable on the floor up until 12/31/2018 when he had an episode of atrial fibrillation with RVR, followed by worsening hypoxia as well as chest pain and required transfer to the ICU.  Subjective / 24h Interval events: Complaining of shortness of breath, overall feels about the same.  Complains of chest pain with deep breathing, no palpitations, no abdominal pain, no nausea or vomiting  Assessment & Plan:  Principal Problem Acute Hypoxic Respiratory Failure due to Covid-19 Viral Illness -Patient remains profoundly hypoxic, continue heated high flow -Patient requiring heated high flow, continue to maintain O2 sats above 88%, continue ICU care -completed remdesivir -on steroids, continue Solu-Medrol 60 every 12 -received Actemra on 12/9 -OK with mild hypoxemia, goal at rest is > 85% SaO2, with movement ideally > 75% -encouraged the patient to sit up in chair in the daytime use I-S and flutter valve for pulmonary toiletry and then prone in bed when at night. -Maintain negative fluid balance, currently is net -5.1 L.  Heated high flow: FiO2 (%):  [100 %] 100 %  O2: 40 L   COVID-19 Labs  Recent Labs    12/30/18 1056 12/31/18 0315 01/01/19 0500  DDIMER 0.43 0.33 0.31  FERRITIN 593* 573* 548*  LDH  --  459* 458*  CRP <0.5 <0.5 <0.5   No results found for: SARSCOV2NAA  Active Problems A. fib with RVR -Patient had previously Graves' disease, and had an episode of A. fib in that setting.  When seen cardiology as an outpatient  did not recommend anticoagulation given limited A. fib.  He has had recurrent A. fib with RVR during this hospitalization and will start on anticoagulation.  This was discussed with the patient.  He agrees, he has no bleeding history.  Continue therapeutic Lovenox, monitor H&H but clinically no evidence of bleeding -His home metoprolol has been resumed  Essential hypertension -Continue metoprolol, soft blood pressure this morning, asymptomatic  Hypothyroidism, history of Graves' disease -Continue Synthroid  Hyperlipidemia -Continue Zetia   Scheduled Meds: . alfuzosin  10 mg Oral QHS  . chlorhexidine  15 mL Mouth Rinse BID  . Chlorhexidine Gluconate Cloth  6 each Topical Daily  . dextromethorphan-guaiFENesin  1 tablet Oral BID  . enoxaparin (LOVENOX) injection  120 mg Subcutaneous Q12H  . ezetimibe  10 mg Oral Daily  . feeding supplement (ENSURE ENLIVE)  237 mL Oral TID BM  . insulin aspart  0-15 Units Subcutaneous Q4H  . levothyroxine  175 mcg Oral QAC breakfast  . loratadine  10 mg Oral Daily  . mouth rinse  15 mL Mouth Rinse q12n4p  . methylPREDNISolone (SOLU-MEDROL) injection  125 mg Intravenous TID  . metoprolol tartrate  25 mg Oral BID  . mirabegron ER  50 mg Oral Daily  . vitamin C  500 mg Oral Daily  . zinc sulfate  220 mg Oral Daily   Continuous Infusions: . sodium chloride 250 mL (12/26/18 0912)   PRN Meds:.sodium chloride, acetaminophen, ALPRAZolam, chlorpheniramine-HYDROcodone, guaiFENesin-dextromethorphan, nitroGLYCERIN, ondansetron (ZOFRAN) IV  DVT prophylaxis: Lovenox Code Status: Full code Family Communication: Called wife Sunday Spillers  Beamer, [phone], no answer, unable to leave a voice mail Disposition Plan: Home when ready  Consultants:  None  Procedures:  None   Microbiology: Blood cultures 12/8 >> no growth SARS - CoV2 12/8 - positive  Antimicrobials: Remdesivir 12/8 - 12/12  Objective: Vitals:   01/02/19 0200 01/02/19 0300 01/02/19 0339  01/02/19 0400  BP:  99/67  124/70  Pulse: 66 64  80  Resp: (!) 24 (!) 23  (!) 24  Temp: 98.2 F (36.8 C)   98.2 F (36.8 C)  TempSrc: Oral   Oral  SpO2: (!) 89% (!) 86%  (!) 89%  Weight:   122.5 kg   Height:        Intake/Output Summary (Last 24 hours) at 01/02/2019 0452 Last data filed at 01/02/2019 0400 Gross per 24 hour  Intake 1220 ml  Output 1360 ml  Net -140 ml   Filed Weights   12/31/18 0500 01/01/19 0431 01/02/19 0339  Weight: 123.4 kg 122.1 kg 122.5 kg    Examination:  Constitutional: eating breakfast, no apparent distress, looks tachypneic Eyes: no icterus  ENMT: mmm Neck: normal, supple Respiratory: Diffuse bilateral rhonchi, no wheezing, diminished at the bases, tachypneic Cardiovascular: Regular rate and rhythm, no murmurs / rubs / gallops.  No peripheral edema, chronic venous stasis changes lower extremities Abdomen: Soft, bowel sounds positive, no tenderness Musculoskeletal: no clubbing / cyanosis.  Skin: No rashes appreciated Neurologic: Nonfocal, equal strength, alert and oriented x4   Data Reviewed: I have independently reviewed following labs and imaging studies   CBC: Recent Labs  Lab 12/28/18 0149 12/29/18 0252 12/30/18 1056 12/31/18 0315 01/01/19 0500  WBC 3.5* 4.9 9.9 14.6* 13.6*  NEUTROABS 2.6 3.5 8.4* 12.6* 11.8*  HGB 15.9 16.8 18.3* 17.5* 18.0*  HCT 47.6 50.4 54.0* 52.5* 54.8*  MCV 94.1 93.5 94.1 94.4 94.5  PLT 172 178 212 243 024   Basic Metabolic Panel: Recent Labs  Lab 12/28/18 0149 12/29/18 0252 12/30/18 1056 12/31/18 0315 01/01/19 0500  NA 136 137 138 139 139  K 3.8 3.5 3.8 4.0 4.1  CL 97* 99 97* 97* 97*  CO2 '25 25 26 26 26  ' GLUCOSE 185* 166* 223* 252* 214*  BUN 22 23 27* 36* 43*  CREATININE 0.88 1.02 1.11 1.12 1.24  CALCIUM 8.2* 8.3* 8.6* 8.9 8.8*  MG 2.1 2.3 2.5* 2.7* 2.8*  PHOS 3.2 3.7 2.8 3.6 4.7*   GFR: Estimated Creatinine Clearance: 67.5 mL/min (by C-G formula based on SCr of 1.24 mg/dL). Liver Function  Tests: Recent Labs  Lab 12/28/18 0149 12/29/18 0252 12/30/18 1056 12/31/18 0315 01/01/19 0500  AST 127* 61* 36 33 36  ALT 150* 119* 90* 75* 72*  ALKPHOS 32* 32* 36* 39 44  BILITOT 0.7 1.0 1.1 1.0 1.2  PROT 6.4* 6.6 6.8 6.8 6.7  ALBUMIN 3.4* 3.6 3.6 3.8 3.7   No results for input(s): LIPASE, AMYLASE in the last 168 hours. No results for input(s): AMMONIA in the last 168 hours. Coagulation Profile: No results for input(s): INR, PROTIME in the last 168 hours. Cardiac Enzymes: No results for input(s): CKTOTAL, CKMB, CKMBINDEX, TROPONINI in the last 168 hours. BNP (last 3 results) No results for input(s): PROBNP in the last 8760 hours. HbA1C: No results for input(s): HGBA1C in the last 72 hours. CBG: Recent Labs  Lab 01/01/19 0757 01/01/19 1558 01/01/19 1952 01/01/19 2339 01/02/19 0330  GLUCAP 223* 280* 248* 276* 229*   Lipid Profile: No results for input(s): CHOL, HDL, LDLCALC, TRIG, CHOLHDL,  LDLDIRECT in the last 72 hours. Thyroid Function Tests: No results for input(s): TSH, T4TOTAL, FREET4, T3FREE, THYROIDAB in the last 72 hours. Anemia Panel: Recent Labs    12/31/18 0315 01/01/19 0500  FERRITIN 573* 548*   Urine analysis:    Component Value Date/Time   COLORURINE YELLOW 12/31/2018 1900   APPEARANCEUR CLEAR 12/25/2018 1900   LABSPEC 1.015 12/30/2018 1900   PHURINE 6.0 01/16/2019 1900   GLUCOSEU NEGATIVE 01/14/2019 1900   HGBUR NEGATIVE 12/26/2018 1900   BILIRUBINUR NEGATIVE 01/12/2019 1900   KETONESUR NEGATIVE 12/23/2018 1900   PROTEINUR 30 (A) 01/10/2019 1900   UROBILINOGEN 1.0 10/03/2006 0054   NITRITE NEGATIVE 01/07/2019 1900   LEUKOCYTESUR NEGATIVE 12/28/2018 1900   Sepsis Labs: Invalid input(s): PROCALCITONIN, LACTICIDVEN  Recent Results (from the past 240 hour(s))  Blood culture (routine x 2)     Status: None   Collection Time: 01/02/2019  3:09 PM   Specimen: BLOOD  Result Value Ref Range Status   Specimen Description   Final    BLOOD BLOOD  LEFT WRIST Performed at [facility], [address] [place], [place] [address]    Special Requests   Final    BOTTLES DRAWN AEROBIC ONLY Blood Culture adequate volume Performed at [facility], [address] [place], [place] [address]    Culture   Final    NO GROWTH 5 DAYS Performed at [facility], [place] [address], [place], [place] [address]    Report Status 12/29/2018 FINAL  Final  Blood culture (routine x 2)     Status: None   Collection Time: 01/03/2019  3:09 PM   Specimen: BLOOD  Result Value Ref Range Status   Specimen Description   Final    BLOOD RIGHT ANTECUBITAL Performed at [facility], [address] [place], [place] [address]    Special Requests   Final    BOTTLES DRAWN AEROBIC AND ANAEROBIC Blood Culture adequate volume Performed at [facility], [address] [place], [place] [address]    Culture   Final    NO GROWTH 5 DAYS Performed at [facility], [place] [address], [place], [place] [address]    Report Status 12/29/2018 FINAL  Final  SARS Coronavirus 2 Ag (30 min TAT) - Nasal Swab (BD Veritor Kit)     Status: Abnormal   Collection Time: 01/09/2019  3:16 PM   Specimen: Nasal Swab (BD Veritor Kit)  Result Value Ref Range Status   SARS Coronavirus 2 Ag POSITIVE (A) NEGATIVE Final    Comment: RESULT CALLED TO, READ BACK BY AND VERIFIED WITH: MARVA SIMMS RN AT 2585 ON 12/28/2018 BY [email or web address] (NOTE) SARS-CoV-2 antigen PRESENT. Positive results indicate the presence of viral antigens, but clinical correlation with patient history and other diagnostic information is necessary to determine patient infection status.  Positive results do not rule out bacterial infection or co-infection  with other viruses. False positive results are rare but can occur, and confirmatory RT-PCR testing may be appropriate in some circumstances. The expected result is Negative. Fact Sheet for Patients:  PodPark.tn Fact Sheet for Providers: GiftContent.is  This test is not yet approved or cleared by the [place] FDA and  has been authorized for detection and/or diagnosis of SARS-CoV-2 by FDA under an Emergency Use Authorization (EUA).  This EUA will remain in effect (meaning this test can be used) for the duration of  the COVID-1 9 declaration under  Section 564(b)(1) of the Act, 21 U.S.C. section 360bbb-3(b)(1), unless the authorization is terminated or revoked sooner. Performed at [facility], [address] [place], [place] [address]   MRSA PCR Screening     Status: None   Collection Time: 01/01/19  5:13 AM   Specimen: Nasal Mucosa; Nasopharyngeal  Result Value Ref Range Status   MRSA by PCR NEGATIVE NEGATIVE Final    Comment:        The GeneXpert MRSA Assay (FDA approved for NASAL specimens only), is one component of a comprehensive MRSA colonization surveillance program. It is not intended to diagnose MRSA infection nor to guide or monitor treatment for MRSA infections. Performed at [facility], [place] [address], [place], [place] [address]      Radiology Studies: DG CHEST PORT 1 VIEW  Result Date: 12/31/2018 CLINICAL DATA:  COVID-19 EXAM: PORTABLE CHEST 1 VIEW COMPARISON:  Portable exam 1307 hours compared to 12/27/2018 FINDINGS: LEFT subclavian sequential transvenous pacemaker leads project at RIGHT atrium and RIGHT ventricle. Normal heart size, mediastinal contours, and pulmonary vascularity. Atherosclerotic calcification aorta. Patchy airspace infiltrates bilaterally consistent with multifocal pneumonia and history of COVID. No pleural effusion or pneumothorax. Bones demineralized. IMPRESSION: Persistent BILATERAL pulmonary infiltrates consistent with history of COVID-19. Electronically Signed   By: Lavonia Dana M.D.   On: 12/31/2018 13:39    Marzetta Board, MD, PhD [facility]  Contact via  [email or web address]  [place] P: [phone] F: [phone]

## 2019-01-03 LAB — CBC WITH DIFFERENTIAL/PLATELET
Abs Immature Granulocytes: 0.78 10*3/uL — ABNORMAL HIGH (ref 0.00–0.07)
Basophils Absolute: 0.1 10*3/uL (ref 0.0–0.1)
Basophils Relative: 0 %
Eosinophils Absolute: 0 10*3/uL (ref 0.0–0.5)
Eosinophils Relative: 0 %
HCT: 56.3 % — ABNORMAL HIGH (ref 39.0–52.0)
Hemoglobin: 18.5 g/dL — ABNORMAL HIGH (ref 13.0–17.0)
Immature Granulocytes: 5 %
Lymphocytes Relative: 3 %
Lymphs Abs: 0.5 10*3/uL — ABNORMAL LOW (ref 0.7–4.0)
MCH: 30.9 pg (ref 26.0–34.0)
MCHC: 32.9 g/dL (ref 30.0–36.0)
MCV: 94 fL (ref 80.0–100.0)
Monocytes Absolute: 0.3 10*3/uL (ref 0.1–1.0)
Monocytes Relative: 2 %
Neutro Abs: 12.9 10*3/uL — ABNORMAL HIGH (ref 1.7–7.7)
Neutrophils Relative %: 90 %
Platelets: 197 10*3/uL (ref 150–400)
RBC: 5.99 MIL/uL — ABNORMAL HIGH (ref 4.22–5.81)
RDW: 12.9 % (ref 11.5–15.5)
WBC: 14.6 10*3/uL — ABNORMAL HIGH (ref 4.0–10.5)
nRBC: 0.1 % (ref 0.0–0.2)

## 2019-01-03 LAB — COMPREHENSIVE METABOLIC PANEL
ALT: 92 U/L — ABNORMAL HIGH (ref 0–44)
AST: 41 U/L (ref 15–41)
Albumin: 3.4 g/dL — ABNORMAL LOW (ref 3.5–5.0)
Alkaline Phosphatase: 43 U/L (ref 38–126)
Anion gap: 14 (ref 5–15)
BUN: 42 mg/dL — ABNORMAL HIGH (ref 8–23)
CO2: 28 mmol/L (ref 22–32)
Calcium: 8.7 mg/dL — ABNORMAL LOW (ref 8.9–10.3)
Chloride: 96 mmol/L — ABNORMAL LOW (ref 98–111)
Creatinine, Ser: 1.08 mg/dL (ref 0.61–1.24)
GFR calc Af Amer: >60 mL/min (ref >60)
GFR calc non Af Amer: >60 mL/min (ref >60)
Glucose, Bld: 198 mg/dL — ABNORMAL HIGH (ref 70–99)
Potassium: 4.3 mmol/L (ref 3.5–5.1)
Sodium: 138 mmol/L (ref 135–145)
Total Bilirubin: 1.2 mg/dL (ref 0.3–1.2)
Total Protein: 6.2 g/dL — ABNORMAL LOW (ref 6.5–8.1)

## 2019-01-03 LAB — GLUCOSE, CAPILLARY
Glucose-Capillary: 174 mg/dL — ABNORMAL HIGH (ref 70–99)
Glucose-Capillary: 176 mg/dL — ABNORMAL HIGH (ref 70–99)
Glucose-Capillary: 183 mg/dL — ABNORMAL HIGH (ref 70–99)
Glucose-Capillary: 190 mg/dL — ABNORMAL HIGH (ref 70–99)
Glucose-Capillary: 229 mg/dL — ABNORMAL HIGH (ref 70–99)
Glucose-Capillary: 238 mg/dL — ABNORMAL HIGH (ref 70–99)

## 2019-01-03 LAB — C-REACTIVE PROTEIN: CRP: <0.5 mg/dL (ref <1.0)

## 2019-01-03 LAB — MAGNESIUM: Magnesium: 2.7 mg/dL — ABNORMAL HIGH (ref 1.7–2.4)

## 2019-01-03 LAB — FERRITIN: Ferritin: 495 ng/mL — ABNORMAL HIGH (ref 24–336)

## 2019-01-03 LAB — LACTATE DEHYDROGENASE: LDH: 494 U/L — ABNORMAL HIGH (ref 98–192)

## 2019-01-03 LAB — D-DIMER, QUANTITATIVE: D-Dimer, Quant: 0.28 ug/mL-FEU (ref 0.00–0.50)

## 2019-01-03 LAB — PHOSPHORUS: Phosphorus: 4.5 mg/dL (ref 2.5–4.6)

## 2019-01-03 MED ORDER — FUROSEMIDE 10 MG/ML IJ SOLN
40.0000 mg | Freq: Once | INTRAMUSCULAR | Status: AC
  Administered 2019-01-03: 14:00:00 40 mg via INTRAVENOUS
  Filled 2019-01-03: qty 4

## 2019-01-03 NOTE — Progress Notes (Addendum)
PROGRESS NOTE    Arthur Daniel  IWL:[id number] DOB: 1940/08/06 DOA: 12/28/2018 PCP: Shelda Pal, DO   Brief Narrative:  78 year old male with history of hyperlipidemia, Graves' disease status post treatment now hypothyroid, pacemaker, who was admitted to the hospital on 12/28/2018 with acute hypoxic respiratory failure in the setting of COVID-19 viral illness.  He was treated with remdesivir, steroids, Actemra.  He was stable on the floor up until 12/31/2018 when he had an episode of atrial fibrillation with RVR, followed by worsening hypoxia as well as chest pain and required transfer to the ICU.  Assessment & Plan:   Principal Problem:   COVID-19 Active Problems:   Hypothyroidism   Essential hypertension   Cardiac pacemaker in situ   BPH (benign prostatic hyperplasia)   Anxiety   Acute respiratory failure with hypoxia (HCC)   Dyslipidemia   Pneumonia due to COVID-19 virus   Gastroenteritis due to COVID-19 virus   Thrombocytopenia (HCC)   Emphysema of lung (HCC)   Atrial fibrillation with RVR (HCC)   Hyperglycemia  Acute Hypoxic Respiratory Failure due to Covid-19 Viral Illness -Patient remains profoundly hypoxic, continue heated high flow (45 L at 100% FiO2) -Patient requiring heated high flow, continue to maintain O2 sats above 88%, continue ICU care -completed remdesivir -on steroids, continue Solu-Medrol 60 every 12 for now.  He's had >10 days.  Will need taper.   -received Actemra on 12/9 -OK with mild hypoxemia, goal at rest is > 85% SaO2, with movement ideally > 75% -encouraged the patient to sit up in chair in the daytime use I-S and flutter valve for pulmonary toiletry and then prone in bed when at night. -Maintain negative fluid balance, currently is net -6 - will continue to diurese as tolerated - CXR 12/19 AM  COVID-19 Labs  Recent Labs    01/01/19 0500 01/02/19 0500 01/03/19 0245  DDIMER 0.31 0.30 0.28  FERRITIN 548* 538* 495*  LDH 458* 447*  494*  CRP <0.5 <0.5 <0.5    No results found for: SARSCOV2NAA  Shinika Estelle. fib with RVR -Patient had previously Graves' disease, and had an episode of Finnian Husted. fib in that setting.  When seen cardiology as an outpatient did not recommend anticoagulation given limited Kearie Mennen. fib.  He has had recurrent Zellie Jenning. fib with RVR during this hospitalization and will start on anticoagulation.  This was discussed with the patient.  He agrees, he has no bleeding history.  Continue therapeutic Lovenox, monitor H&H but clinically no evidence of bleeding.  Chadsvasc is ~3. -His home metoprolol has been resumed  Essential hypertension -Continue metoprolol, soft blood pressure this morning, asymptomatic  Hypothyroidism, history of Graves' disease -Continue Synthroid  Hyperlipidemia -Continue Zetia  DVT prophylaxis: lovenox Code Status: full  Family Communication: none at bedside - wife over phone Disposition Plan: pending  Consultants:   none  Procedures:   none  Antimicrobials:  Anti-infectives (From admission, onward)   Start     Dose/Rate Route Frequency Ordered Stop   12/25/18 1000  remdesivir 100 mg in sodium chloride 0.9 % 100 mL IVPB  Status:  Discontinued     100 mg 200 mL/hr over 30 Minutes Intravenous Daily 01/15/2019 1625 12/18/2018 2257   12/25/18 1000  remdesivir 100 mg in sodium chloride 0.9 % 100 mL IVPB     100 mg 200 mL/hr over 30 Minutes Intravenous Daily 12/20/2018 2257 12/29/18 1216   12/22/2018 2330  remdesivir 200 mg in sodium chloride 0.9% 250 mL IVPB  200 mg 580 mL/hr over 30 Minutes Intravenous Once 12/26/2018 2257 12/25/18 0029   01/02/2019 1630  remdesivir 200 mg in sodium chloride 0.9% 250 mL IVPB  Status:  Discontinued     200 mg 580 mL/hr over 30 Minutes Intravenous Once 01/11/2019 1625 01/10/2019 2257     Subjective: Feeling subjectively better, though still with high O2 needs.  Objective: Vitals:   01/03/19 0910 01/03/19 1000 01/03/19 1100 01/03/19 1106  BP:  (!) 113/51  113/69   Pulse: 82 74 91 74  Resp: (!) 25 (!) 24 (!) 24 (!) 25  Temp:      TempSrc:      SpO2: 90% 90% (!) 85% (!) 84%  Weight:      Height:        Intake/Output Summary (Last 24 hours) at 01/03/2019 1348 Last data filed at 01/03/2019 0600 Gross per 24 hour  Intake 480 ml  Output 1450 ml  Net -970 ml   Filed Weights   01/01/19 0431 01/02/19 0339 01/03/19 0500  Weight: 122.1 kg 122.5 kg 120.9 kg    Examination:  General exam: Appears calm and comfortable  Respiratory system: continues on HFNC, distant breath sounds - work of breathing appears appropriate Cardiovascular system: RRR Gastrointestinal system: Abdomen is nondistended, soft and nontender Central nervous system: Alert and oriented. No focal neurological deficits. Extremities: no LEE Skin: No rashes, lesions or ulcers Psychiatry: Judgement and insight appear normal. Mood & affect appropriate.     Data Reviewed: I have personally reviewed following labs and imaging studies  CBC: Recent Labs  Lab 12/30/18 1056 12/31/18 0315 01/01/19 0500 01/02/19 0500 01/03/19 0245  WBC 9.9 14.6* 13.6* 15.4* 14.6*  NEUTROABS 8.4* 12.6* 11.8* 13.4* 12.9*  HGB 18.3* 17.5* 18.0* 18.0* 18.5*  HCT 54.0* 52.5* 54.8* 53.5* 56.3*  MCV 94.1 94.4 94.5 94.4 94.0  PLT 212 243 282 223 629   Basic Metabolic Panel: Recent Labs  Lab 12/30/18 1056 12/31/18 0315 01/01/19 0500 01/02/19 0500 01/03/19 0245  NA 138 139 139 137 138  K 3.8 4.0 4.1 3.9 4.3  CL 97* 97* 97* 96* 96*  CO2 '26 26 26 25 28  ' GLUCOSE 223* 252* 214* 220* 198*  BUN 27* 36* 43* 42* 42*  CREATININE 1.11 1.12 1.24 1.22 1.08  CALCIUM 8.6* 8.9 8.8* 8.6* 8.7*  MG 2.5* 2.7* 2.8* 2.8* 2.7*  PHOS 2.8 3.6 4.7* 4.7* 4.5   GFR: Estimated Creatinine Clearance: 77 mL/min (by C-G formula based on SCr of 1.08 mg/dL). Liver Function Tests: Recent Labs  Lab 12/30/18 1056 12/31/18 0315 01/01/19 0500 01/02/19 0500 01/03/19 0245  AST 36 33 36 42* 41  ALT 90* 75* 72* 83* 92*   ALKPHOS 36* 39 44 46 43  BILITOT 1.1 1.0 1.2 0.8 1.2  PROT 6.8 6.8 6.7 6.2* 6.2*  ALBUMIN 3.6 3.8 3.7 3.4* 3.4*   No results for input(s): LIPASE, AMYLASE in the last 168 hours. No results for input(s): AMMONIA in the last 168 hours. Coagulation Profile: No results for input(s): INR, PROTIME in the last 168 hours. Cardiac Enzymes: No results for input(s): CKTOTAL, CKMB, CKMBINDEX, TROPONINI in the last 168 hours. BNP (last 3 results) No results for input(s): PROBNP in the last 8760 hours. HbA1C: No results for input(s): HGBA1C in the last 72 hours. CBG: Recent Labs  Lab 01/02/19 1957 01/03/19 0002 01/03/19 0351 01/03/19 0744 01/03/19 1154  GLUCAP 243* 183* 229* 176* 190*   Lipid Profile: No results for input(s): CHOL, HDL, LDLCALC, TRIG,  CHOLHDL, LDLDIRECT in the last 72 hours. Thyroid Function Tests: No results for input(s): TSH, T4TOTAL, FREET4, T3FREE, THYROIDAB in the last 72 hours. Anemia Panel: Recent Labs    01/02/19 0500 01/03/19 0245  FERRITIN 538* 495*   Sepsis Labs: No results for input(s): PROCALCITON, LATICACIDVEN in the last 168 hours.  Recent Results (from the past 240 hour(s))  Blood culture (routine x 2)     Status: None   Collection Time: 12/20/2018  3:09 PM   Specimen: BLOOD  Result Value Ref Range Status   Specimen Description   Final    BLOOD BLOOD LEFT WRIST Performed at [facility], [address] [place], [place] [address]    Special Requests   Final    BOTTLES DRAWN AEROBIC ONLY Blood Culture adequate volume Performed at [facility], [address] [place], [place] [address]    Culture   Final    NO GROWTH 5 DAYS Performed at [facility], [place] [address], [place], [place] [address]    Report Status 12/29/2018 FINAL  Final  Blood culture (routine x 2)     Status: None   Collection Time: 01/01/2019  3:09 PM   Specimen: BLOOD  Result Value Ref Range Status   Specimen Description   Final    BLOOD RIGHT  ANTECUBITAL Performed at [facility], [address] [place], [place] [address]    Special Requests   Final    BOTTLES DRAWN AEROBIC AND ANAEROBIC Blood Culture adequate volume Performed at [facility], [address] [place], [place] [address]    Culture   Final    NO GROWTH 5 DAYS Performed at [facility], [place] [address] [place], [place] [address]    Report Status 12/29/2018 FINAL  Final  SARS Coronavirus 2 Ag (30 min TAT) - Nasal Swab (BD Veritor Kit)     Status: Abnormal   Collection Time: 01/15/2019  3:16 PM   Specimen: Nasal Swab (BD Veritor Kit)  Result Value Ref Range Status   SARS Coronavirus 2 Ag POSITIVE (Haillie Radu) NEGATIVE Final    Comment: RESULT CALLED TO, READ BACK BY AND VERIFIED WITH: MARVA SIMMS RN AT 2336 ON 01/09/2019 BY [email or web address] (NOTE) SARS-CoV-2 antigen PRESENT. Positive results indicate the presence of viral antigens, but clinical correlation with patient history and other diagnostic information is necessary to determine patient infection status.  Positive results do not rule out bacterial infection or co-infection  with other viruses. False positive results are rare but can occur, and confirmatory RT-PCR testing may be appropriate in some circumstances. The expected result is Negative. Fact Sheet for Patients: PodPark.tn Fact Sheet for Providers: GiftContent.is  This test is not yet approved or cleared by the [place] FDA and  has been authorized for detection and/or diagnosis of SARS-CoV-2 by FDA under an Emergency Use Authorization (EUA).  This EUA will remain in effect (meaning this test can be used) for the duration of  the COVID-1 9 declaration under Section 564(b)(1) of the Act, 21 U.S.C. section 360bbb-3(b)(1), unless the authorization is terminated or revoked sooner. Performed at [facility], [address] [place], [place] [address]   MRSA PCR  Screening     Status: None   Collection Time: 01/01/19  5:13 AM   Specimen: Nasal Mucosa; Nasopharyngeal  Result Value Ref Range Status   MRSA by PCR NEGATIVE NEGATIVE Final    Comment:  The GeneXpert MRSA Assay (FDA approved for NASAL specimens only), is one component of Raynisha Avilla comprehensive MRSA colonization surveillance program. It is not intended to diagnose MRSA infection nor to guide or monitor treatment for MRSA infections. Performed at [facility], [place] [address] [place], [place] [address]          Radiology Studies: No results found.      Scheduled Meds: . alfuzosin  10 mg Oral QHS  . chlorhexidine  15 mL Mouth Rinse BID  . Chlorhexidine Gluconate Cloth  6 each Topical Daily  . dextromethorphan-guaiFENesin  1 tablet Oral BID  . enoxaparin (LOVENOX) injection  120 mg Subcutaneous Q12H  . ezetimibe  10 mg Oral Daily  . feeding supplement (ENSURE ENLIVE)  237 mL Oral TID BM  . insulin aspart  0-15 Units Subcutaneous Q4H  . levothyroxine  175 mcg Oral QAC breakfast  . loratadine  10 mg Oral Daily  . mouth rinse  15 mL Mouth Rinse q12n4p  . methylPREDNISolone (SOLU-MEDROL) injection  60 mg Intravenous Q12H  . metoprolol tartrate  25 mg Oral BID  . mirabegron ER  50 mg Oral Daily  . vitamin C  500 mg Oral Daily  . zinc sulfate  220 mg Oral Daily   Continuous Infusions: . sodium chloride 250 mL (12/26/18 0912)     LOS: 10 days    Time spent: over 30 min    Fayrene Helper, MD [facility] Pager [email or web address]  If 7PM-7AM, please contact night-coverage [email or web address] Password [email or web address] 01/03/2019, 1:48 PM

## 2019-01-04 LAB — COMPREHENSIVE METABOLIC PANEL
ALT: 111 U/L — ABNORMAL HIGH (ref 0–44)
AST: 40 U/L (ref 15–41)
Albumin: 3.5 g/dL (ref 3.5–5.0)
Alkaline Phosphatase: 46 U/L (ref 38–126)
Anion gap: 15 (ref 5–15)
BUN: 50 mg/dL — ABNORMAL HIGH (ref 8–23)
CO2: 26 mmol/L (ref 22–32)
Calcium: 8.7 mg/dL — ABNORMAL LOW (ref 8.9–10.3)
Chloride: 96 mmol/L — ABNORMAL LOW (ref 98–111)
Creatinine, Ser: 1.16 mg/dL (ref 0.61–1.24)
GFR calc Af Amer: >60 mL/min (ref >60)
GFR calc non Af Amer: >60 mL/min (ref >60)
Glucose, Bld: 243 mg/dL — ABNORMAL HIGH (ref 70–99)
Potassium: 4 mmol/L (ref 3.5–5.1)
Sodium: 137 mmol/L (ref 135–145)
Total Bilirubin: 1.3 mg/dL — ABNORMAL HIGH (ref 0.3–1.2)
Total Protein: 6.3 g/dL — ABNORMAL LOW (ref 6.5–8.1)

## 2019-01-04 LAB — CBC WITH DIFFERENTIAL/PLATELET
Abs Immature Granulocytes: 0.22 10*3/uL — ABNORMAL HIGH (ref 0.00–0.07)
Basophils Absolute: 0 10*3/uL (ref 0.0–0.1)
Basophils Relative: 0 %
Eosinophils Absolute: 0 10*3/uL (ref 0.0–0.5)
Eosinophils Relative: 0 %
HCT: 56 % — ABNORMAL HIGH (ref 39.0–52.0)
Hemoglobin: 19 g/dL — ABNORMAL HIGH (ref 13.0–17.0)
Immature Granulocytes: 1 %
Lymphocytes Relative: 3 %
Lymphs Abs: 0.4 10*3/uL — ABNORMAL LOW (ref 0.7–4.0)
MCH: 31.4 pg (ref 26.0–34.0)
MCHC: 33.9 g/dL (ref 30.0–36.0)
MCV: 92.6 fL (ref 80.0–100.0)
Monocytes Absolute: 0.2 10*3/uL (ref 0.1–1.0)
Monocytes Relative: 1 %
Neutro Abs: 14.6 10*3/uL — ABNORMAL HIGH (ref 1.7–7.7)
Neutrophils Relative %: 95 %
Platelets: 192 10*3/uL (ref 150–400)
RBC: 6.05 MIL/uL — ABNORMAL HIGH (ref 4.22–5.81)
RDW: 13.1 % (ref 11.5–15.5)
WBC: 15.5 10*3/uL — ABNORMAL HIGH (ref 4.0–10.5)
nRBC: 0.1 % (ref 0.0–0.2)

## 2019-01-04 LAB — GLUCOSE, CAPILLARY
Glucose-Capillary: 197 mg/dL — ABNORMAL HIGH (ref 70–99)
Glucose-Capillary: 217 mg/dL — ABNORMAL HIGH (ref 70–99)
Glucose-Capillary: 217 mg/dL — ABNORMAL HIGH (ref 70–99)
Glucose-Capillary: 225 mg/dL — ABNORMAL HIGH (ref 70–99)
Glucose-Capillary: 234 mg/dL — ABNORMAL HIGH (ref 70–99)
Glucose-Capillary: 264 mg/dL — ABNORMAL HIGH (ref 70–99)

## 2019-01-04 LAB — HEPATITIS PANEL, ACUTE
HCV Ab: NONREACTIVE
Hep A IgM: NONREACTIVE
Hep B C IgM: NONREACTIVE
Hepatitis B Surface Ag: NONREACTIVE

## 2019-01-04 LAB — FERRITIN: Ferritin: 627 ng/mL — ABNORMAL HIGH (ref 24–336)

## 2019-01-04 LAB — D-DIMER, QUANTITATIVE: D-Dimer, Quant: 0.43 ug/mL-FEU (ref 0.00–0.50)

## 2019-01-04 LAB — MAGNESIUM: Magnesium: 2.8 mg/dL — ABNORMAL HIGH (ref 1.7–2.4)

## 2019-01-04 LAB — C-REACTIVE PROTEIN: CRP: <0.5 mg/dL (ref <1.0)

## 2019-01-04 MED ORDER — FUROSEMIDE 10 MG/ML IJ SOLN
80.0000 mg | Freq: Once | INTRAMUSCULAR | Status: AC
  Administered 2019-01-05: 80 mg via INTRAVENOUS
  Filled 2019-01-04: qty 8

## 2019-01-04 MED ORDER — INSULIN DETEMIR 100 UNIT/ML ~~LOC~~ SOLN
0.0750 [IU]/kg | Freq: Two times a day (BID) | SUBCUTANEOUS | Status: DC
  Administered 2019-01-04 (×2): 9 [IU] via SUBCUTANEOUS
  Filled 2019-01-04 (×3): qty 0.09

## 2019-01-04 MED ORDER — INSULIN ASPART 100 UNIT/ML ~~LOC~~ SOLN
0.0000 [IU] | SUBCUTANEOUS | Status: DC
  Administered 2019-01-04: 17:00:00 5 [IU] via SUBCUTANEOUS
  Administered 2019-01-04 (×2): 3 [IU] via SUBCUTANEOUS
  Administered 2019-01-04: 13:00:00 5 [IU] via SUBCUTANEOUS
  Administered 2019-01-05 (×2): 3 [IU] via SUBCUTANEOUS
  Administered 2019-01-05 (×3): 5 [IU] via SUBCUTANEOUS
  Administered 2019-01-06: 20:00:00 2 [IU] via SUBCUTANEOUS
  Administered 2019-01-06 (×4): 3 [IU] via SUBCUTANEOUS
  Administered 2019-01-06: 01:00:00 2 [IU] via SUBCUTANEOUS
  Administered 2019-01-07 (×2): 5 [IU] via SUBCUTANEOUS
  Administered 2019-01-07 (×3): 2 [IU] via SUBCUTANEOUS
  Administered 2019-01-07: 12:00:00 3 [IU] via SUBCUTANEOUS
  Administered 2019-01-08 (×2): 5 [IU] via SUBCUTANEOUS
  Administered 2019-01-08 (×2): 3 [IU] via SUBCUTANEOUS
  Administered 2019-01-08: 06:00:00 5 [IU] via SUBCUTANEOUS
  Administered 2019-01-08: 23:00:00 2 [IU] via SUBCUTANEOUS
  Administered 2019-01-08: 16:00:00 3 [IU] via SUBCUTANEOUS
  Administered 2019-01-09: 5 [IU] via SUBCUTANEOUS
  Administered 2019-01-09 – 2019-01-10 (×4): 3 [IU] via SUBCUTANEOUS
  Administered 2019-01-10: 17:00:00 15 [IU] via SUBCUTANEOUS
  Administered 2019-01-10: 01:00:00 2 [IU] via SUBCUTANEOUS
  Administered 2019-01-10: 22:00:00 8 [IU] via SUBCUTANEOUS
  Administered 2019-01-10: 03:00:00 2 [IU] via SUBCUTANEOUS
  Administered 2019-01-10: 14:00:00 11 [IU] via SUBCUTANEOUS
  Administered 2019-01-11 (×2): 15 [IU] via SUBCUTANEOUS

## 2019-01-04 MED ORDER — PREDNISONE 20 MG PO TABS: 20.0000 mg | ORAL_TABLET | Freq: Every day | ORAL | Status: DC

## 2019-01-04 MED ORDER — PREDNISONE 20 MG PO TABS: 30.0000 mg | ORAL_TABLET | Freq: Every day | ORAL | Status: DC

## 2019-01-04 MED ORDER — PREDNISONE 20 MG PO TABS: 40.0000 mg | ORAL_TABLET | Freq: Every day | ORAL | Status: DC

## 2019-01-04 MED ORDER — FUROSEMIDE 10 MG/ML IJ SOLN
60.0000 mg | Freq: Four times a day (QID) | INTRAMUSCULAR | Status: DC
  Administered 2019-01-04: 13:00:00 60 mg via INTRAVENOUS
  Filled 2019-01-04: qty 6

## 2019-01-04 MED ORDER — PANTOPRAZOLE SODIUM 40 MG PO TBEC
40.0000 mg | DELAYED_RELEASE_TABLET | Freq: Every day | ORAL | Status: DC
  Administered 2019-01-04 – 2019-01-09 (×6): 40 mg via ORAL
  Filled 2019-01-04 (×6): qty 1

## 2019-01-04 MED ORDER — PREDNISONE 10 MG PO TABS: 10.0000 mg | ORAL_TABLET | Freq: Every day | ORAL | Status: DC

## 2019-01-04 MED ORDER — METHYLPREDNISOLONE SODIUM SUCC 40 MG IJ SOLR
40.0000 mg | Freq: Two times a day (BID) | INTRAMUSCULAR | Status: DC
  Administered 2019-01-04 – 2019-01-05 (×2): 40 mg via INTRAVENOUS
  Filled 2019-01-04 (×2): qty 1

## 2019-01-04 NOTE — Progress Notes (Addendum)
PROGRESS NOTE    Arthur Daniel  [id number] DOB: 10/30/1940 DOA: 12/22/2018 PCP: Shelda Pal, DO   Brief Narrative:  78 year old male with history of hyperlipidemia, Graves' disease status post treatment now hypothyroid, pacemaker, who was admitted to the hospital on 12/27/2018 with acute hypoxic respiratory failure in the setting of COVID-19 viral illness.  He was treated with remdesivir, steroids, Actemra.  He was stable on the floor up until 12/31/2018 when he had an episode of atrial fibrillation with RVR, followed by worsening hypoxia as well as chest pain and required transfer to the ICU.  Assessment & Plan:   Principal Problem:   COVID-19 Active Problems:   Hypothyroidism   Essential hypertension   Cardiac pacemaker in situ   BPH (benign prostatic hyperplasia)   Anxiety   Acute respiratory failure with hypoxia (HCC)   Dyslipidemia   Pneumonia due to COVID-19 virus   Gastroenteritis due to COVID-19 virus   Thrombocytopenia (HCC)   Emphysema of lung (HCC)   Atrial fibrillation with RVR (HCC)   Hyperglycemia  Acute Hypoxic Respiratory Failure due to Covid-19 Viral Illness -Hypoxia appears to be improving, now on NRB and HFNC at 15, improved from yesterday -completed remdesivir -on steroids, he's had greater than 10 days.  Taper steroids. -received Actemra on 12/9 -OK with mild hypoxemia, goal at rest is > 85% SaO2, with movement ideally > 75% -encouraged the patient to sit up in chair in the daytime use I-S and flutter valve for pulmonary toiletry and then prone in bed when at night. -Maintain negative fluid balance, currently is net -6.6 - will continue to diurese as tolerated (will follow after dose today, appears to be getting Ellianah Cordy bit dry with hemoconcentration) - CXR 12/20 AM  COVID-19 Labs  Recent Labs    01/02/19 0500 01/03/19 0245 01/04/19 0414  DDIMER 0.30 0.28 0.43  FERRITIN 538* 495* 627*  LDH 447* 494*  --   CRP <0.5 <0.5 <0.5    No  results found for: SARSCOV2NAA  Daena Alper. fib with RVR -Patient had previously Graves' disease, and had an episode of Danicia Terhaar. fib in that setting.  When seen cardiology as an outpatient did not recommend anticoagulation given limited Varsha Knock. fib.  He has had recurrent Kishana Battey. fib with RVR during this hospitalization and will start on anticoagulation.  This was discussed with the patient by one of previous hospitalist.  He agrees, he has no bleeding history.  Continue therapeutic Lovenox, monitor H&H but clinically no evidence of bleeding.  Chadsvasc is 3. -His home metoprolol has been resumed -consider transition to doac -currently in sinus rhythm  Essential hypertension -Continue metoprolol, soft blood pressure this morning, asymptomatic  Hypothyroidism, history of Graves' disease -Continue Synthroid  Hyperlipidemia -Continue Zetia  Prediabetes  Steroid and Stress Induced Hyperglycemia:  Continue SSI, start basal insulin Watch with steroid taper  Elevated LFT's: likely related to COVID, follow.  Follow acute hepatitis panel.  Polycythemia  Leukocytosis: WBC related to steroids, elevated Hb may be related to diuresis.  Follow.    DVT prophylaxis: lovenox full dose Code Status: full  Family Communication: none at bedside - wife over phone 12/18, no answer 12/19 Disposition Plan: pending  Consultants:   none  Procedures:   none  Antimicrobials:  Anti-infectives (From admission, onward)   Start     Dose/Rate Route Frequency Ordered Stop   12/25/18 1000  remdesivir 100 mg in sodium chloride 0.9 % 100 mL IVPB  Status:  Discontinued     100  mg 200 mL/hr over 30 Minutes Intravenous Daily 01/12/2019 1625 01/05/2019 2257   12/25/18 1000  remdesivir 100 mg in sodium chloride 0.9 % 100 mL IVPB     100 mg 200 mL/hr over 30 Minutes Intravenous Daily 01/04/2019 2257 12/29/18 1216   12/26/2018 2330  remdesivir 200 mg in sodium chloride 0.9% 250 mL IVPB     200 mg 580 mL/hr over 30 Minutes Intravenous Once  12/30/2018 2257 12/25/18 0029   01/02/2019 1630  remdesivir 200 mg in sodium chloride 0.9% 250 mL IVPB  Status:  Discontinued     200 mg 580 mL/hr over 30 Minutes Intravenous Once 01/08/2019 1625 12/28/2018 2257     Subjective: Feels about the same today, no new complaints  Objective: Vitals:   01/04/19 0030 01/04/19 0400 01/04/19 0800 01/04/19 0910  BP: 112/64 (!) 106/49 (!) 95/54   Pulse: 81 85 94 92  Resp: (!) 24 (!) 24  (!) 22  Temp:  98.4 F (36.9 C)    TempSrc:  Oral    SpO2: 96% 91% 91% 95%  Weight:      Height:        Intake/Output Summary (Last 24 hours) at 01/04/2019 1249 Last data filed at 01/04/2019 0000 Gross per 24 hour  Intake 100 ml  Output 725 ml  Net -625 ml   Filed Weights   01/01/19 0431 01/02/19 0339 01/03/19 0500  Weight: 122.1 kg 122.5 kg 120.9 kg    Examination:  General: No acute distress. Cardiovascular: RRR Lungs: distant breath sounds, comfortable on NRB and HFNC Abdomen: Soft, nontender, nondistended Neurological: Alert and oriented 3. Moves all extremities 4. Cranial nerves II through XII grossly intact. Skin: Warm and dry. No rashes or lesions. Extremities: trace edema     Data Reviewed: I have personally reviewed following labs and imaging studies  CBC: Recent Labs  Lab 12/31/18 0315 01/01/19 0500 01/02/19 0500 01/03/19 0245 01/04/19 0414  WBC 14.6* 13.6* 15.4* 14.6* 15.5*  NEUTROABS 12.6* 11.8* 13.4* 12.9* 14.6*  HGB 17.5* 18.0* 18.0* 18.5* 19.0*  HCT 52.5* 54.8* 53.5* 56.3* 56.0*  MCV 94.4 94.5 94.4 94.0 92.6  PLT 243 282 223 197 [id number]   Basic Metabolic Panel: Recent Labs  Lab 12/30/18 1056 12/31/18 0315 01/01/19 0500 01/02/19 0500 01/03/19 0245 01/04/19 0414  NA 138 139 139 137 138 137  K 3.8 4.0 4.1 3.9 4.3 4.0  CL 97* 97* 97* 96* 96* 96*  CO2 26 26 26 25 28 26   GLUCOSE 223* 252* 214* 220* 198* 243*  BUN 27* 36* 43* 42* 42* 50*  CREATININE 1.11 1.12 1.24 1.22 1.08 1.16  CALCIUM 8.6* 8.9 8.8* 8.6* 8.7* 8.7*   MG 2.5* 2.7* 2.8* 2.8* 2.7* 2.8*  PHOS 2.8 3.6 4.7* 4.7* 4.5  --    GFR: Estimated Creatinine Clearance: 71.6 mL/min (by C-G formula based on SCr of 1.16 mg/dL). Liver Function Tests: Recent Labs  Lab 12/31/18 0315 01/01/19 0500 01/02/19 0500 01/03/19 0245 01/04/19 0414  AST 33 36 42* 41 40  ALT 75* 72* 83* 92* 111*  ALKPHOS 39 44 46 43 46  BILITOT 1.0 1.2 0.8 1.2 1.3*  PROT 6.8 6.7 6.2* 6.2* 6.3*  ALBUMIN 3.8 3.7 3.4* 3.4* 3.5   No results for input(s): LIPASE, AMYLASE in the last 168 hours. No results for input(s): AMMONIA in the last 168 hours. Coagulation Profile: No results for input(s): INR, PROTIME in the last 168 hours. Cardiac Enzymes: No results for input(s): CKTOTAL, CKMB, CKMBINDEX, TROPONINI in  the last 168 hours. BNP (last 3 results) No results for input(s): PROBNP in the last 8760 hours. HbA1C: No results for input(s): HGBA1C in the last 72 hours. CBG: Recent Labs  Lab 01/03/19 2132 01/04/19 0016 01/04/19 0433 01/04/19 0748 01/04/19 1232  GLUCAP 174* 234* 264* 225* 217*   Lipid Profile: No results for input(s): CHOL, HDL, LDLCALC, TRIG, CHOLHDL, LDLDIRECT in the last 72 hours. Thyroid Function Tests: No results for input(s): TSH, T4TOTAL, FREET4, T3FREE, THYROIDAB in the last 72 hours. Anemia Panel: Recent Labs    01/03/19 0245 01/04/19 0414  FERRITIN 495* 627*   Sepsis Labs: No results for input(s): PROCALCITON, LATICACIDVEN in the last 168 hours.  Recent Results (from the past 240 hour(s))  MRSA PCR Screening     Status: None   Collection Time: 01/01/19  5:13 AM   Specimen: Nasal Mucosa; Nasopharyngeal  Result Value Ref Range Status   MRSA by PCR NEGATIVE NEGATIVE Final    Comment:        The GeneXpert MRSA Assay (FDA approved for NASAL specimens only), is one component of Johm Pfannenstiel comprehensive MRSA colonization surveillance program. It is not intended to diagnose MRSA infection nor to guide or monitor treatment for MRSA  infections. Performed at [facility], [place] [address], [place], [place] [address]          Radiology Studies: No results found.      Scheduled Meds: . alfuzosin  10 mg Oral QHS  . chlorhexidine  15 mL Mouth Rinse BID  . Chlorhexidine Gluconate Cloth  6 each Topical Daily  . dextromethorphan-guaiFENesin  1 tablet Oral BID  . enoxaparin (LOVENOX) injection  120 mg Subcutaneous Q12H  . ezetimibe  10 mg Oral Daily  . feeding supplement (ENSURE ENLIVE)  237 mL Oral TID BM  . furosemide  60 mg Intravenous Q6H  . insulin aspart  0-15 Units Subcutaneous Q4H  . insulin detemir  0.075 Units/kg Subcutaneous BID  . levothyroxine  175 mcg Oral QAC breakfast  . loratadine  10 mg Oral Daily  . mouth rinse  15 mL Mouth Rinse q12n4p  . methylPREDNISolone (SOLU-MEDROL) injection  60 mg Intravenous Q12H  . metoprolol tartrate  25 mg Oral BID  . mirabegron ER  50 mg Oral Daily  . vitamin C  500 mg Oral Daily  . zinc sulfate  220 mg Oral Daily   Continuous Infusions: . sodium chloride 250 mL (12/26/18 0912)     LOS: 11 days    Time spent: over 30 min    Fayrene Helper, MD [facility] Pager AMION  If 7PM-7AM, please contact night-coverage [email or web address] Password [facility] 01/04/2019, 12:49 PM

## 2019-01-05 ENCOUNTER — Inpatient Hospital Stay (HOSPITAL_COMMUNITY): Payer: Medicare HMO

## 2019-01-05 DIAGNOSIS — U071 COVID-19: Principal | ICD-10-CM

## 2019-01-05 DIAGNOSIS — J9601 Acute respiratory failure with hypoxia: Secondary | ICD-10-CM

## 2019-01-05 DIAGNOSIS — I4891 Unspecified atrial fibrillation: Secondary | ICD-10-CM

## 2019-01-05 LAB — BRAIN NATRIURETIC PEPTIDE: B Natriuretic Peptide: 45.3 pg/mL (ref 0.0–100.0)

## 2019-01-05 LAB — COMPREHENSIVE METABOLIC PANEL
ALT: 98 U/L — ABNORMAL HIGH (ref 0–44)
AST: 38 U/L (ref 15–41)
Albumin: 3.1 g/dL — ABNORMAL LOW (ref 3.5–5.0)
Alkaline Phosphatase: 45 U/L (ref 38–126)
Anion gap: 13 (ref 5–15)
BUN: 54 mg/dL — ABNORMAL HIGH (ref 8–23)
CO2: 25 mmol/L (ref 22–32)
Calcium: 8.5 mg/dL — ABNORMAL LOW (ref 8.9–10.3)
Chloride: 99 mmol/L (ref 98–111)
Creatinine, Ser: 1.12 mg/dL (ref 0.61–1.24)
GFR calc Af Amer: >60 mL/min (ref >60)
GFR calc non Af Amer: >60 mL/min (ref >60)
Glucose, Bld: 206 mg/dL — ABNORMAL HIGH (ref 70–99)
Potassium: 4 mmol/L (ref 3.5–5.1)
Sodium: 137 mmol/L (ref 135–145)
Total Bilirubin: 0.9 mg/dL (ref 0.3–1.2)
Total Protein: 6 g/dL — ABNORMAL LOW (ref 6.5–8.1)

## 2019-01-05 LAB — CBC WITH DIFFERENTIAL/PLATELET
Abs Immature Granulocytes: 0.33 10*3/uL — ABNORMAL HIGH (ref 0.00–0.07)
Basophils Absolute: 0 10*3/uL (ref 0.0–0.1)
Basophils Relative: 0 %
Eosinophils Absolute: 0 10*3/uL (ref 0.0–0.5)
Eosinophils Relative: 0 %
HCT: 55.3 % — ABNORMAL HIGH (ref 39.0–52.0)
Hemoglobin: 18.8 g/dL — ABNORMAL HIGH (ref 13.0–17.0)
Immature Granulocytes: 2 %
Lymphocytes Relative: 3 %
Lymphs Abs: 0.4 10*3/uL — ABNORMAL LOW (ref 0.7–4.0)
MCH: 31.1 pg (ref 26.0–34.0)
MCHC: 34 g/dL (ref 30.0–36.0)
MCV: 91.6 fL (ref 80.0–100.0)
Monocytes Absolute: 0.3 10*3/uL (ref 0.1–1.0)
Monocytes Relative: 2 %
Neutro Abs: 13.2 10*3/uL — ABNORMAL HIGH (ref 1.7–7.7)
Neutrophils Relative %: 93 %
Platelets: 161 10*3/uL (ref 150–400)
RBC: 6.04 MIL/uL — ABNORMAL HIGH (ref 4.22–5.81)
RDW: 13 % (ref 11.5–15.5)
WBC: 14.3 10*3/uL — ABNORMAL HIGH (ref 4.0–10.5)
nRBC: 0 % (ref 0.0–0.2)

## 2019-01-05 LAB — GLUCOSE, CAPILLARY
Glucose-Capillary: 204 mg/dL — ABNORMAL HIGH (ref 70–99)
Glucose-Capillary: 196 mg/dL — ABNORMAL HIGH (ref 70–99)
Glucose-Capillary: 213 mg/dL — ABNORMAL HIGH (ref 70–99)
Glucose-Capillary: 227 mg/dL — ABNORMAL HIGH (ref 70–99)
Glucose-Capillary: 162 mg/dL — ABNORMAL HIGH (ref 70–99)

## 2019-01-05 LAB — FERRITIN: Ferritin: 519 ng/mL — ABNORMAL HIGH (ref 24–336)

## 2019-01-05 LAB — MAGNESIUM: Magnesium: 2.8 mg/dL — ABNORMAL HIGH (ref 1.7–2.4)

## 2019-01-05 LAB — TROPONIN I (HIGH SENSITIVITY)
Troponin I (High Sensitivity): 7 ng/L (ref <18)
Troponin I (High Sensitivity): 7 ng/L (ref <18)

## 2019-01-05 LAB — D-DIMER, QUANTITATIVE: D-Dimer, Quant: 0.3 ug/mL-FEU (ref 0.00–0.50)

## 2019-01-05 LAB — C-REACTIVE PROTEIN: CRP: 0.8 mg/dL (ref <1.0)

## 2019-01-05 MED ORDER — METHYLPREDNISOLONE SODIUM SUCC 125 MG IJ SOLR: 60.0000 mg | Freq: Two times a day (BID) | INTRAMUSCULAR | Status: DC

## 2019-01-05 MED ORDER — POTASSIUM CHLORIDE CRYS ER 20 MEQ PO TBCR
40.0000 meq | EXTENDED_RELEASE_TABLET | Freq: Once | ORAL | Status: AC
  Administered 2019-01-05: 40 meq via ORAL
  Filled 2019-01-05: qty 2

## 2019-01-05 MED ORDER — PREDNISONE 20 MG PO TABS
40.0000 mg | ORAL_TABLET | Freq: Every day | ORAL | Status: DC
  Filled 2019-01-05: qty 2

## 2019-01-05 MED ORDER — POTASSIUM CHLORIDE CRYS ER 20 MEQ PO TBCR
40.0000 meq | EXTENDED_RELEASE_TABLET | Freq: Once | ORAL | Status: AC
  Administered 2019-01-05: 17:00:00 40 meq via ORAL
  Filled 2019-01-05: qty 2

## 2019-01-05 MED ORDER — PREDNISONE 10 MG PO TABS: 10.0000 mg | ORAL_TABLET | Freq: Every day | ORAL | Status: DC

## 2019-01-05 MED ORDER — METHYLPREDNISOLONE SODIUM SUCC 40 MG IJ SOLR: 40.0000 mg | Freq: Two times a day (BID) | INTRAMUSCULAR | Status: DC

## 2019-01-05 MED ORDER — PREDNISONE 20 MG PO TABS: 30.0000 mg | ORAL_TABLET | Freq: Every day | ORAL | Status: DC

## 2019-01-05 MED ORDER — FUROSEMIDE 10 MG/ML IJ SOLN
60.0000 mg | Freq: Four times a day (QID) | INTRAMUSCULAR | Status: AC
  Administered 2019-01-05 (×2): 60 mg via INTRAVENOUS
  Filled 2019-01-05 (×2): qty 6

## 2019-01-05 MED ORDER — INSULIN DETEMIR 100 UNIT/ML ~~LOC~~ SOLN
11.0000 [IU] | Freq: Two times a day (BID) | SUBCUTANEOUS | Status: DC
  Administered 2019-01-05 – 2019-01-10 (×12): 11 [IU] via SUBCUTANEOUS
  Filled 2019-01-05 (×13): qty 0.11

## 2019-01-05 MED ORDER — PREDNISONE 20 MG PO TABS: 20.0000 mg | ORAL_TABLET | Freq: Every day | ORAL | Status: DC

## 2019-01-05 MED ORDER — DEXAMETHASONE SODIUM PHOSPHATE 10 MG/ML IJ SOLN
6.0000 mg | Freq: Once | INTRAMUSCULAR | Status: AC
  Administered 2019-01-06: 10:00:00 6 mg via INTRAVENOUS
  Filled 2019-01-05: qty 1

## 2019-01-05 MED ORDER — POTASSIUM CHLORIDE CRYS ER 20 MEQ PO TBCR: 40.0000 meq | EXTENDED_RELEASE_TABLET | ORAL | Status: DC

## 2019-01-05 NOTE — Progress Notes (Addendum)
0730: Assumed care of Pt from night RN. Pt alert, denies pain, SpO2 84% on 50L HHFNC and NRB. Pt denies SOB or any change in his breathing from yesterday. RR regular and even and unlabored. ST 100's, BP's 90s-100s/50s. Wants to stay in bed for now, will get out of bed as well as prone later. Denies needs at this time  0830: Per MD, will watch for clinical signs of deterioration as pt currently does not appear to be in distress.   1130: Reassessed, VSS, SpO2 85-88% on 50L HHFNC and NRB. Pt denies any worsening SOB or chest pain. Does not want to get OOB, agreed to prone. Will monitor.   1340: Spouse updated over phone, all questions answered.   1530: Pt still does not want to get to chair at this time. Education provided regarding importance of mobilizing and allowing lungs to expand to heal. Would like to remain in bed, but agreed to prone. Reassessed, VSS, SpO2 85-91% on 50L HHFNC and NRB. Denies pain. Will monitor

## 2019-01-05 NOTE — Consult Note (Signed)
NAME:  Arthur Daniel, MRN:  [id number], DOB:  1940/06/03, LOS: 12 ADMISSION DATE:  01/09/2019, CONSULTATION DATE:  01/05/19 REFERRING MD:  Florene Glen, CHIEF COMPLAINT:  SOB   Brief History   78 year old man with a history of sleep apnea, pacemaker, BPH presenting with severe Covid pneumonia.   History of present illness   78 year old man with a history of sleep apnea, pacemaker, BPH presenting with severe Covid pneumonia.  Hospital course complicated by A. fib/RVR.  He has a history of Graves' disease status post thyroid ablation.  He is now dependent on Synthroid supplementation.  Has remained with high oxygen requirements that worsened today prompting critical care evaluation. Overall patient says he feels about the same in terms of breathing status.  He has dyspnea with any exertion.  He is trying to lie prone but this is difficult and painful.  Denies any significant cough or chest pain.    Past Medical History  Pacemaker Hypertension Acquired hypothyroidism Hyperlipidemia BPH  Significant Hospital Events   12/31/2018 admitted  Consults:  PCCM  Procedures:  None  Significant Diagnostic Tests:  Chest x-ray bilateral airspace disease worse on right  Micro Data:  12/8 Pct neg  Antimicrobials:  5 days remdesivir completed 12/12   Interim history/subjective:  Consulted.  Objective   Blood pressure (!) 103/44, pulse 83, temperature 97.7 F (36.5 C), temperature source Axillary, resp. rate (!) 25, height 5\' 11"  (1.803 m), weight 120.9 kg, SpO2 92 %.    FiO2 (%):  [100 %] 100 %   Intake/Output Summary (Last 24 hours) at 01/05/2019 1707 Last data filed at 01/05/2019 1500 Gross per 24 hour  Intake 1287 ml  Output 1850 ml  Net -563 ml   Filed Weights   01/01/19 0431 01/02/19 0339 01/03/19 0500  Weight: 122.1 kg 122.5 kg 120.9 kg    Examination: GEN: elderly man lying on right side HEENT: NRB in place, trachea midline CV: RRR, ext warm PULM: Scattered rhonci,  mild tachynea GI: Soft, hypoactive BS EXT: trace edema NEURO: Moves all 4 ext to command PSYCH: Good insight SKIN: No rashes  Serum chemistries benign, LFTs show a downtrending transaminitis, mild, troponin negative, ferritin stable, CRP stable, stable mild leukocytosis and stable polycythemia, platelets slightly lower than yesterday at 161 from 192, chest x-ray as above  Resolved Hospital Problem list   N/A  Assessment & Plan:  # Acute hypoxemic respiratory failure secondary to Covid ARDS-persistent with question of transition into fibroproliferative phase, I see no evidence of superimposed bacterial infection.  He does not look particularly fluid overloaded but a trial of diuresis is reasonable.  Inflammatory markers and d dimer low. - Continue awake proning as able - Encourage incentive spirometry - Encourage up to chair - Decision to intubate will be based on clinical gestalt and mental status. -Steroid taper is reasonable as ordered - Trial of diuretics ongoing   # Steroid-induced hyperglycemia- on levemir/SSI, monitor and adjust PRN  # Acquired hypothyroidism- last TSH not too bad, continue synthroid  Best practice:  Diet: Diabetic Pain/Anxiety/Delirium protocol (if indicated): N/A VAP protocol (if indicated): N/A DVT prophylaxis: full dose lovenox (afib) GI prophylaxis: PPI Glucose control: levemir and SSI Mobility: up to chair Code Status: full code Family Communication: per primary Disposition:  ICU, high risk for intubation given worsening O2 needs  Labs   CBC: Recent Labs  Lab 01/01/19 0500 01/02/19 0500 01/03/19 0245 01/04/19 0414 01/05/19 0245  WBC 13.6* 15.4* 14.6* 15.5* 14.3*  NEUTROABS 11.8* 13.4* 12.9*  14.6* 13.2*  HGB 18.0* 18.0* 18.5* 19.0* 18.8*  HCT 54.8* 53.5* 56.3* 56.0* 55.3*  MCV 94.5 94.4 94.0 92.6 91.6  PLT 282 223 197 192 [id number]    Basic Metabolic Panel: Recent Labs  Lab 12/30/18 1056 12/31/18 0315 01/01/19 0500 01/02/19 0500  01/03/19 0245 01/04/19 0414 01/05/19 0245  NA 138 139 139 137 138 137 137  K 3.8 4.0 4.1 3.9 4.3 4.0 4.0  CL 97* 97* 97* 96* 96* 96* 99  CO2 26 26 26 25 28 26 25   GLUCOSE 223* 252* 214* 220* 198* 243* 206*  BUN 27* 36* 43* 42* 42* 50* 54*  CREATININE 1.11 1.12 1.24 1.22 1.08 1.16 1.12  CALCIUM 8.6* 8.9 8.8* 8.6* 8.7* 8.7* 8.5*  MG 2.5* 2.7* 2.8* 2.8* 2.7* 2.8* 2.8*  PHOS 2.8 3.6 4.7* 4.7* 4.5  --   --    GFR: Estimated Creatinine Clearance: 74.2 mL/min (by C-G formula based on SCr of 1.12 mg/dL). Recent Labs  Lab 01/02/19 0500 01/03/19 0245 01/04/19 0414 01/05/19 0245  WBC 15.4* 14.6* 15.5* 14.3*    Liver Function Tests: Recent Labs  Lab 01/01/19 0500 01/02/19 0500 01/03/19 0245 01/04/19 0414 01/05/19 0245  AST 36 42* 41 40 38  ALT 72* 83* 92* 111* 98*  ALKPHOS 44 46 43 46 45  BILITOT 1.2 0.8 1.2 1.3* 0.9  PROT 6.7 6.2* 6.2* 6.3* 6.0*  ALBUMIN 3.7 3.4* 3.4* 3.5 3.1*   No results for input(s): LIPASE, AMYLASE in the last 168 hours. No results for input(s): AMMONIA in the last 168 hours.  ABG    Component Value Date/Time   TCO2 29 11/24/2008 1620     Coagulation Profile: No results for input(s): INR, PROTIME in the last 168 hours.  Cardiac Enzymes: No results for input(s): CKTOTAL, CKMB, CKMBINDEX, TROPONINI in the last 168 hours.  HbA1C: Hgb A1c MFr Bld  Date/Time Value Ref Range Status  11/25/2018 08:11 AM 6.2 4.6 - 6.5 % Final    Comment:    Glycemic Control Guidelines for People with Diabetes:Non Diabetic:  <6%Goal of Therapy: <7%Additional Action Suggested:  >8%   08/20/2018 01:08 PM 6.3 4.6 - 6.5 % Final    Comment:    Glycemic Control Guidelines for People with Diabetes:Non Diabetic:  <6%Goal of Therapy: <7%Additional Action Suggested:  >8%     CBG: Recent Labs  Lab 01/04/19 2347 01/05/19 0347 01/05/19 0756 01/05/19 1126 01/05/19 1605  GLUCAP 197* 204* 196* 213* 227*    Review of Systems:   Full 14 point ROS negative except for  HPI  Past Medical History  He,  has a past medical history of 10 year risk of MI or stroke 7.5% or greater (04/28/2016), Allergic rhinitis, cause unspecified, BPH (benign prostatic hyperplasia) (10/18/2015), Diverticulosis, ED (erectile dysfunction), Essential hypertension, benign, Hernia, umbilical, Lump or mass in breast, Mixed hyperlipidemia, Obesity, Pacemaker, Postablative hypothyroidism, Sleep apnea, and Ureteral stone.   Surgical History    Past Surgical History:  Procedure Laterality Date  . ANTERIOR CRUCIATE LIGAMENT REPAIR Right 1994  . CATARACT EXTRACTION, BILATERAL  08/2010  . COLONOSCOPY  2001, 2008  . FLEXIBLE SIGMOIDOSCOPY  2001  . FOOT SURGERY Left 2005   Fot repair after chainsaw accident  . INSERTION OF MESH N/A 08/31/2016   Procedure: INSERTION OF MESH;  Surgeon: Jackolyn Confer, MD;  Location: [facility];  Service: General;  Laterality: N/A;  . MENISCUS REPAIR Left 2004  . OTHER SURGICAL HISTORY  1995   Ear surgery due to mastoiditis  .  PACEMAKER INSERTION  11/26/08   [name]  . SHOULDER ARTHROSCOPY WITH SUBACROMIAL DECOMPRESSION Left 10/20/2013   Procedure: LEFT SHOULDER SCOPE/DISTAL ACROMIOPLASTY, LABRAL DEBRIDMENT;  Surgeon: [name], MD;  Location: [facility];  Service: Orthopedics;  Laterality: Left;  . SHOULDER SURGERY Right 11/2002  . SHOULDER SURGERY Right 2011  . THYROGLOSSAL DUCT CYST    . TONSILLECTOMY AND ADENOIDECTOMY    . UMBILICAL HERNIA REPAIR N/A 08/31/2016   Procedure: UMBILICAL HERNIA REPAIR WITH MESH;  Surgeon: Jackolyn Confer, MD;  Location: [facility];  Service: General;  Laterality: N/A;  . VASECTOMY       Social History   reports that he quit smoking about 12 years ago. His smoking use included cigarettes. He has a 55.00 pack-year smoking history. He quit smokeless tobacco use about 5 years ago.  His smokeless tobacco use included chew. He reports current alcohol use. He reports that he does not use drugs.   Family History   His  family history includes CAD in his brother and mother; CVA in his mother; Cancer in his sister; Cirrhosis in his father; Diabetes in his mother; Goiter in his mother; Heart attack (age of onset: 33) in his son; Hypercholesterolemia in his brother and mother; Hypertension in his brother and mother.   Allergies Allergies  Allergen Reactions  . Lipitor [Atorvastatin] Other (See Comments)    Breast lumps     Home Medications  Prior to Admission medications   Medication Sig Start Date End Date Taking? Authorizing Provider  alfuzosin (UROXATRAL) 10 MG 24 hr tablet Take 10 mg by mouth at bedtime.   Yes [provider]  ALPRAZolam Duanne Moron) 0.5 MG tablet Take 1 tablet (0.5 mg total) by mouth daily as needed for anxiety. 11/25/18  Yes Shelda Pal, DO  ezetimibe (ZETIA) 10 MG tablet Take 1 tablet (10 mg total) by mouth daily. 05/24/18  Yes Wendling, Crosby Oyster, DO  Flax Oil-Fish Oil-Borage Oil CAPS Take 1 capsule by mouth daily.   Yes [provider]  levocetirizine (XYZAL) 5 MG tablet Take 1 tablet (5 mg total) by mouth every evening. 01/10/18  Yes Shelda Pal, DO  levothyroxine (SYNTHROID) 175 MCG tablet TAKE 1 TABLET (175 MCG TOTAL) BY MOUTH DAILY BEFORE BREAKFAST. 07/18/18  Yes Shelda Pal, DO  metoprolol tartrate (LOPRESSOR) 25 MG tablet Take 1 tablet (25 mg total) by mouth 2 (two) times daily. 07/23/18  Yes Deboraha Sprang, MD  MYRBETRIQ 50 MG TB24 tablet Take 50 mg by mouth daily.  04/10/16  Yes [provider]  NON FORMULARY C- PAP MACHINE   Yes [provider]  OVER THE COUNTER MEDICATION Take 1 capsule by mouth daily. Amazing Grape Supplement   Yes [provider]  silver sulfADIAZINE (SILVADENE) 1 % cream Apply 1 application topically daily. Over burn. Patient not taking: Reported on 12/25/2018 08/08/17   Shelda Pal, DO

## 2019-01-05 NOTE — Progress Notes (Addendum)
PROGRESS NOTE    Arthur Daniel  [id number] DOB: 1940-06-20 DOA: 01/02/2019 PCP: Shelda Pal, DO   Brief Narrative:  78 year old male with history of hyperlipidemia, Graves' disease status post treatment now hypothyroid, pacemaker, who was admitted to the hospital on 12/29/2018 with acute hypoxic respiratory failure in the setting of COVID-19 viral illness.  He was treated with remdesivir, steroids, Actemra.  He was stable on the floor up until 12/31/2018 when he had an episode of atrial fibrillation with RVR, followed by worsening hypoxia as well as chest pain and required transfer to the ICU.  Assessment & Plan:   Principal Problem:   COVID-19 Active Problems:   Hypothyroidism   Essential hypertension   Cardiac pacemaker in situ   BPH (benign prostatic hyperplasia)   Anxiety   Acute respiratory failure with hypoxia (HCC)   Dyslipidemia   Pneumonia due to COVID-19 virus   Gastroenteritis due to COVID-19 virus   Thrombocytopenia (HCC)   Emphysema of lung (HCC)   Atrial fibrillation with RVR (HCC)   Hyperglycemia  Acute Hypoxic Respiratory Failure due to Covid-19 Viral Illness -Significant increase in O2 needs overnight.  BNP is normal.  Inflammatory labs stable.  Now requiring 50 L HFNC and NRB.  Satting in mid 80's.  High risk for intubation, will touch base with PCCM. - CXR 12/20 with bilateral opacities with R sided worsening - completed remdesivir - on steroids, he's had greater than 10 days.  Will begin taper. -received Actemra on 12/9 -OK with mild hypoxemia, goal at rest is > 85% SaO2, with movement ideally > 75% -encouraged the patient to sit up in chair in the daytime use I-S and flutter valve for pulmonary toiletry and then prone in bed when at night. -Maintain negative fluid balance, currently is net -6 - will continue to diurese as tolerated.  K supplementation with diuresis (received 80 IV lasix this morning as well). - repeat CXR  tomorrow.  COVID-19 Labs  Recent Labs    01/03/19 0245 01/04/19 0414 01/05/19 0245  DDIMER 0.28 0.43 0.30  FERRITIN 495* 627* 519*  LDH 494*  --   --   CRP <0.5 <0.5 0.8    No results found for: SARSCOV2NAA  Hue Frick. fib with RVR -Patient had previously Graves' disease, and had an episode of Kymberli Wiegand. fib in that setting.  When seen cardiology as an outpatient did not recommend anticoagulation given limited Deral Schellenberg. fib.  He has had recurrent Malcolm Quast. fib with RVR during this hospitalization and will start on anticoagulation.  This was discussed with the patient by one of previous hospitalist.  He agrees, he has no bleeding history.  Continue therapeutic Lovenox, monitor H&H but clinically no evidence of bleeding.  Chadsvasc is 3. -His home metoprolol has been resumed -consider transition to doac -currently in sinus rhythm  Essential hypertension -Continue metoprolol  Hypothyroidism, history of Graves' disease -Continue Synthroid  Hyperlipidemia -Continue Zetia  Prediabetes  Steroid and Stress Induced Hyperglycemia:  Continue SSI, basal insulin adjust as needed  Elevated LFT's: likely related to COVID, follow.  Follow acute hepatitis panel (negative).  Polycythemia  Leukocytosis: WBC related to steroids, elevated Hb may be related to diuresis.  Follow.    DVT prophylaxis: lovenox full dose Code Status: full  Family Communication: none at bedside - wife over phone 12/18, no answer 12/19 - 12/20 discussed with wife Disposition Plan: pending  Consultants:   none  Procedures:   none  Antimicrobials:  Anti-infectives (From admission, onward)   Start  Dose/Rate Route Frequency Ordered Stop   12/25/18 1000  remdesivir 100 mg in sodium chloride 0.9 % 100 mL IVPB  Status:  Discontinued     100 mg 200 mL/hr over 30 Minutes Intravenous Daily 01/06/2019 1625 01/06/2019 2257   12/25/18 1000  remdesivir 100 mg in sodium chloride 0.9 % 100 mL IVPB     100 mg 200 mL/hr over 30 Minutes  Intravenous Daily 12/26/2018 2257 12/29/18 1216   12/17/2018 2330  remdesivir 200 mg in sodium chloride 0.9% 250 mL IVPB     200 mg 580 mL/hr over 30 Minutes Intravenous Once 01/04/2019 2257 12/25/18 0029   01/14/2019 1630  remdesivir 200 mg in sodium chloride 0.9% 250 mL IVPB  Status:  Discontinued     200 mg 580 mL/hr over 30 Minutes Intravenous Once 01/12/2019 1625 01/15/2019 2257     Subjective: No new complaints SOB is the same, though   Objective: Vitals:   01/05/19 0630 01/05/19 0645 01/05/19 0740 01/05/19 0900  BP:   101/75 (!) 111/59  Pulse: (!) 101 99 100 (!) 103  Resp: (!) 25 (!) 23 (!) 22   Temp:      TempSrc:      SpO2: (!) 83% (!) 79% (!) 83%   Weight:      Height:        Intake/Output Summary (Last 24 hours) at 01/05/2019 1012 Last data filed at 01/05/2019 0600 Gross per 24 hour  Intake 1237 ml  Output 850 ml  Net 387 ml   Filed Weights   01/01/19 0431 01/02/19 0339 01/03/19 0500  Weight: 122.1 kg 122.5 kg 120.9 kg    Examination:  .General: No acute distress. Cardiovascular: RRR Lungs: distant breath sounds, mildly increased WOB, overall appears comfortable on 50 L HFNC and NRB Abdomen: Soft, nontender, nondistended Neurological: Alert and oriented 3. Moves all extremities 4. Cranial nerves II through XII grossly intact. Skin: Warm and dry. No rashes or lesions. Extremities: No clubbing or cyanosis. No edema.    Data Reviewed: I have personally reviewed following labs and imaging studies  CBC: Recent Labs  Lab 01/01/19 0500 01/02/19 0500 01/03/19 0245 01/04/19 0414 01/05/19 0245  WBC 13.6* 15.4* 14.6* 15.5* 14.3*  NEUTROABS 11.8* 13.4* 12.9* 14.6* 13.2*  HGB 18.0* 18.0* 18.5* 19.0* 18.8*  HCT 54.8* 53.5* 56.3* 56.0* 55.3*  MCV 94.5 94.4 94.0 92.6 91.6  PLT 282 223 197 192 [id number]   Basic Metabolic Panel: Recent Labs  Lab 12/30/18 1056 12/31/18 0315 01/01/19 0500 01/02/19 0500 01/03/19 0245 01/04/19 0414 01/05/19 0245  NA 138 139 139 137  138 137 137  K 3.8 4.0 4.1 3.9 4.3 4.0 4.0  CL 97* 97* 97* 96* 96* 96* 99  CO2 26 26 26 25 28 26 25   GLUCOSE 223* 252* 214* 220* 198* 243* 206*  BUN 27* 36* 43* 42* 42* 50* 54*  CREATININE 1.11 1.12 1.24 1.22 1.08 1.16 1.12  CALCIUM 8.6* 8.9 8.8* 8.6* 8.7* 8.7* 8.5*  MG 2.5* 2.7* 2.8* 2.8* 2.7* 2.8* 2.8*  PHOS 2.8 3.6 4.7* 4.7* 4.5  --   --    GFR: Estimated Creatinine Clearance: 74.2 mL/min (by C-G formula based on SCr of 1.12 mg/dL). Liver Function Tests: Recent Labs  Lab 01/01/19 0500 01/02/19 0500 01/03/19 0245 01/04/19 0414 01/05/19 0245  AST 36 42* 41 40 38  ALT 72* 83* 92* 111* 98*  ALKPHOS 44 46 43 46 45  BILITOT 1.2 0.8 1.2 1.3* 0.9  PROT 6.7 6.2* 6.2*  6.3* 6.0*  ALBUMIN 3.7 3.4* 3.4* 3.5 3.1*   No results for input(s): LIPASE, AMYLASE in the last 168 hours. No results for input(s): AMMONIA in the last 168 hours. Coagulation Profile: No results for input(s): INR, PROTIME in the last 168 hours. Cardiac Enzymes: No results for input(s): CKTOTAL, CKMB, CKMBINDEX, TROPONINI in the last 168 hours. BNP (last 3 results) No results for input(s): PROBNP in the last 8760 hours. HbA1C: No results for input(s): HGBA1C in the last 72 hours. CBG: Recent Labs  Lab 01/04/19 1232 01/04/19 1701 01/04/19 2347 01/05/19 0347 01/05/19 0756  GLUCAP 217* 217* 197* 204* 196*   Lipid Profile: No results for input(s): CHOL, HDL, LDLCALC, TRIG, CHOLHDL, LDLDIRECT in the last 72 hours. Thyroid Function Tests: No results for input(s): TSH, T4TOTAL, FREET4, T3FREE, THYROIDAB in the last 72 hours. Anemia Panel: Recent Labs    01/04/19 0414 01/05/19 0245  FERRITIN 627* 519*   Sepsis Labs: No results for input(s): PROCALCITON, LATICACIDVEN in the last 168 hours.  Recent Results (from the past 240 hour(s))  MRSA PCR Screening     Status: None   Collection Time: 01/01/19  5:13 AM   Specimen: Nasal Mucosa; Nasopharyngeal  Result Value Ref Range Status   MRSA by PCR NEGATIVE  NEGATIVE Final    Comment:        The GeneXpert MRSA Assay (FDA approved for NASAL specimens only), is one component of Melyssa Signor comprehensive MRSA colonization surveillance program. It is not intended to diagnose MRSA infection nor to guide or monitor treatment for MRSA infections. Performed at [facility], [place] [address], [place], [place] [address]          Radiology Studies: DG CHEST PORT 1 VIEW  Result Date: 01/05/2019 CLINICAL DATA:  Shortness of breath.  COVID-19 EXAM: PORTABLE CHEST 1 VIEW COMPARISON:  Radiograph 12/31/2018 FINDINGS: Left-sided pacemaker in place. Bilateral heterogeneous lung opacities with slight worsening on the right in the interim. No pneumothorax. No evidence of pleural effusion. Unchanged heart size. IMPRESSION: Bilateral heterogeneous lung opacities with slight worsening on the right over the past 5 days. Electronically Signed   By: Keith Rake M.D.   On: 01/05/2019 04:55        Scheduled Meds: . alfuzosin  10 mg Oral QHS  . chlorhexidine  15 mL Mouth Rinse BID  . Chlorhexidine Gluconate Cloth  6 each Topical Daily  . dextromethorphan-guaiFENesin  1 tablet Oral BID  . enoxaparin (LOVENOX) injection  120 mg Subcutaneous Q12H  . ezetimibe  10 mg Oral Daily  . feeding supplement (ENSURE ENLIVE)  237 mL Oral TID BM  . furosemide  60 mg Intravenous Q6H  . insulin aspart  0-15 Units Subcutaneous Q4H  . insulin detemir  11 Units Subcutaneous BID  . levothyroxine  175 mcg Oral QAC breakfast  . loratadine  10 mg Oral Daily  . mouth rinse  15 mL Mouth Rinse q12n4p  . methylPREDNISolone (SOLU-MEDROL) injection  40 mg Intravenous Q12H  . metoprolol tartrate  25 mg Oral BID  . mirabegron ER  50 mg Oral Daily  . pantoprazole  40 mg Oral Daily  . [START ON 01/07/2019] predniSONE  40 mg Oral Q breakfast   Followed by  . [START ON 01/09/2019] predniSONE  30 mg Oral Q breakfast   Followed by  . [START ON 01/11/2019] predniSONE  20 mg  Oral Q breakfast   Followed by  . [START ON Feb 05, 2019] predniSONE  10 mg Oral Q breakfast  .  vitamin C  500 mg Oral Daily  . zinc sulfate  220 mg Oral Daily   Continuous Infusions: . sodium chloride 250 mL (12/26/18 0912)     LOS: 12 days    Time spent: over 30 min 45 min critical care time with worsening AHRF 2/2 COVID 19 pneumonia.    Fayrene Helper, MD [facility] Pager AMION  If 7PM-7AM, please contact night-coverage [email or web address] Password [email or web address] 01/05/2019, 10:12 AM

## 2019-01-06 LAB — CBC WITH DIFFERENTIAL/PLATELET
Abs Immature Granulocytes: 0.15 10*3/uL — ABNORMAL HIGH (ref 0.00–0.07)
Basophils Absolute: 0 10*3/uL (ref 0.0–0.1)
Basophils Relative: 0 %
Eosinophils Absolute: 0.2 10*3/uL (ref 0.0–0.5)
Eosinophils Relative: 1 %
HCT: 58.2 % — ABNORMAL HIGH (ref 39.0–52.0)
Hemoglobin: 19.7 g/dL — ABNORMAL HIGH (ref 13.0–17.0)
Immature Granulocytes: 1 %
Lymphocytes Relative: 4 %
Lymphs Abs: 0.6 10*3/uL — ABNORMAL LOW (ref 0.7–4.0)
MCH: 32 pg (ref 26.0–34.0)
MCHC: 33.8 g/dL (ref 30.0–36.0)
MCV: 94.5 fL (ref 80.0–100.0)
Monocytes Absolute: 0.4 10*3/uL (ref 0.1–1.0)
Monocytes Relative: 2 %
Neutro Abs: 13.5 10*3/uL — ABNORMAL HIGH (ref 1.7–7.7)
Neutrophils Relative %: 92 %
Platelets: 176 10*3/uL (ref 150–400)
RBC: 6.16 MIL/uL — ABNORMAL HIGH (ref 4.22–5.81)
RDW: 13.2 % (ref 11.5–15.5)
WBC: 14.8 10*3/uL — ABNORMAL HIGH (ref 4.0–10.5)
nRBC: 0 % (ref 0.0–0.2)

## 2019-01-06 LAB — FERRITIN: Ferritin: 586 ng/mL — ABNORMAL HIGH (ref 24–336)

## 2019-01-06 LAB — COMPREHENSIVE METABOLIC PANEL
ALT: 101 U/L — ABNORMAL HIGH (ref 0–44)
AST: 41 U/L (ref 15–41)
Albumin: 3.1 g/dL — ABNORMAL LOW (ref 3.5–5.0)
Alkaline Phosphatase: 49 U/L (ref 38–126)
Anion gap: 17 — ABNORMAL HIGH (ref 5–15)
BUN: 51 mg/dL — ABNORMAL HIGH (ref 8–23)
CO2: 24 mmol/L (ref 22–32)
Calcium: 8.1 mg/dL — ABNORMAL LOW (ref 8.9–10.3)
Chloride: 96 mmol/L — ABNORMAL LOW (ref 98–111)
Creatinine, Ser: 1.25 mg/dL — ABNORMAL HIGH (ref 0.61–1.24)
GFR calc Af Amer: >60 mL/min (ref >60)
GFR calc non Af Amer: 56 mL/min — ABNORMAL LOW (ref >60)
Glucose, Bld: 145 mg/dL — ABNORMAL HIGH (ref 70–99)
Potassium: 4.1 mmol/L (ref 3.5–5.1)
Sodium: 137 mmol/L (ref 135–145)
Total Bilirubin: 1 mg/dL (ref 0.3–1.2)
Total Protein: 6 g/dL — ABNORMAL LOW (ref 6.5–8.1)

## 2019-01-06 LAB — GLUCOSE, CAPILLARY
Glucose-Capillary: 134 mg/dL — ABNORMAL HIGH (ref 70–99)
Glucose-Capillary: 135 mg/dL — ABNORMAL HIGH (ref 70–99)
Glucose-Capillary: 153 mg/dL — ABNORMAL HIGH (ref 70–99)
Glucose-Capillary: 155 mg/dL — ABNORMAL HIGH (ref 70–99)
Glucose-Capillary: 164 mg/dL — ABNORMAL HIGH (ref 70–99)
Glucose-Capillary: 174 mg/dL — ABNORMAL HIGH (ref 70–99)
Glucose-Capillary: 176 mg/dL — ABNORMAL HIGH (ref 70–99)

## 2019-01-06 LAB — PROCALCITONIN: Procalcitonin: 0.13 ng/mL

## 2019-01-06 LAB — BRAIN NATRIURETIC PEPTIDE: B Natriuretic Peptide: 101.6 pg/mL — ABNORMAL HIGH (ref 0.0–100.0)

## 2019-01-06 LAB — D-DIMER, QUANTITATIVE: D-Dimer, Quant: 0.53 ug/mL-FEU — ABNORMAL HIGH (ref 0.00–0.50)

## 2019-01-06 LAB — MAGNESIUM: Magnesium: 2.7 mg/dL — ABNORMAL HIGH (ref 1.7–2.4)

## 2019-01-06 LAB — PHOSPHORUS: Phosphorus: 4.3 mg/dL (ref 2.5–4.6)

## 2019-01-06 LAB — C-REACTIVE PROTEIN: CRP: <0.5 mg/dL (ref <1.0)

## 2019-01-06 NOTE — Progress Notes (Signed)
PT Cancellation Note  Patient Details Name: Arthur Daniel MRN: [id number] DOB: 1940-03-23   Cancelled Treatment:    Reason Eval/Treat Not Completed: Medical issues which prohibited therapy.  Per RN, Pt not ready to mobilize with PT today.  PT and/or OT to check back tomorrow.   Thanks,  Verdene Lennert, PT, DPT  Acute Rehabilitation [phone] pager [phone] office  @ [facility]: [phone]     Harvie Heck 01/06/2019, 4:51 PM

## 2019-01-06 NOTE — Progress Notes (Signed)
ANTICOAGULATION CONSULT NOTE - Follow Up Consult  Pharmacy Consult for Lovenox Indication: atrial fibrillation  Allergies  Allergen Reactions  . Lipitor [Atorvastatin] Other (See Comments)    Breast lumps    Patient Measurements: Height: 5\' 11"  (180.3 cm) Weight: 267 lb 6.7 oz (121.3 kg) IBW/kg (Calculated) : 75.3  Vital Signs: Temp: 97.9 F (36.6 C) (12/21 0800) Temp Source: Axillary (12/21 0800) BP: 118/65 (12/21 1226) Pulse Rate: 82 (12/21 1226)  Labs: Recent Labs    01/04/19 0414 01/04/19 2342 01/05/19 0245 01/06/19 0302  HGB 19.0*  --  18.8* 19.7*  HCT 56.0*  --  55.3* 58.2*  PLT 192  --  161 176  CREATININE 1.16  --  1.12 1.25*  TROPONINIHS  --  7 7  --     Estimated Creatinine Clearance: 66.6 mL/min (A) (by C-G formula based on SCr of 1.25 mg/dL (H)).   Medical History: Past Medical History:  Diagnosis Date  . 10 year risk of MI or stroke 7.5% or greater 04/28/2016  . Allergic rhinitis, cause unspecified   . BPH (benign prostatic hyperplasia) 10/18/2015  . Diverticulosis   . ED (erectile dysfunction)   . Essential hypertension, benign   . Hernia, umbilical   . Lump or mass in breast   . Mixed hyperlipidemia   . Obesity   . Pacemaker    [place], [place] 2010  . Postablative hypothyroidism   . Sleep apnea    Use CPAP machine  . Ureteral stone    Dr. Risa Grill    Medications:  Scheduled:  . alfuzosin  10 mg Oral QHS  . chlorhexidine  15 mL Mouth Rinse BID  . Chlorhexidine Gluconate Cloth  6 each Topical Daily  . dextromethorphan-guaiFENesin  1 tablet Oral BID  . enoxaparin (LOVENOX) injection  120 mg Subcutaneous Q12H  . ezetimibe  10 mg Oral Daily  . feeding supplement (ENSURE ENLIVE)  237 mL Oral TID BM  . insulin aspart  0-15 Units Subcutaneous Q4H  . insulin detemir  11 Units Subcutaneous BID  . levothyroxine  175 mcg Oral QAC breakfast  . loratadine  10 mg Oral Daily  . mouth rinse  15 mL Mouth Rinse q12n4p  . metoprolol tartrate  25 mg  Oral BID  . mirabegron ER  50 mg Oral Daily  . pantoprazole  40 mg Oral Daily  . vitamin C  500 mg Oral Daily  . zinc sulfate  220 mg Oral Daily   Infusions:  . sodium chloride 250 mL (12/26/18 0912)   PRN: sodium chloride, acetaminophen, ALPRAZolam, chlorpheniramine-HYDROcodone, guaiFENesin-dextromethorphan, nitroGLYCERIN, ondansetron (ZOFRAN) IV  Assessment: 78 yo male admitted 01/06/2019 with acute hypoxic respiratory failure in the setting of COVID-19 viral illness now on therapeutic Lovenox for Afib. CHADSVASc 3.   H/H elevated. Plt wnl   Goal of Therapy:  Anti-Xa level 0.6-1 units/ml 4hrs after LMWH dose given Monitor platelets by anticoagulation protocol: Yes   Plan:  Continue Lovenox to 1mg /kg SQ q12h Monitor renal function, CBC, signs/symptoms of bleeding F/u transition to oral anticoagulation   Albertina Parr, PharmD., BCPS Clinical Pharmacist Clinical phone for 01/06/19 until 5pm: [phone]

## 2019-01-06 NOTE — Consult Note (Signed)
   NAME:  Arthur Daniel, MRN:  [id number], DOB:  04/06/1940, LOS: 27 ADMISSION DATE:  12/19/2018, CONSULTATION DATE:  01/05/19 REFERRING MD:  Florene Glen, CHIEF COMPLAINT:  SOB   Brief History   78 year old man with a history of sleep apnea, pacemaker, BPH presenting with severe Covid pneumonia.   History of present illness   78 year old man with a history of sleep apnea, pacemaker, BPH presenting with severe Covid pneumonia.  Hospital course complicated by A. fib/RVR.  He has a history of Graves' disease status post thyroid ablation.  He is now dependent on Synthroid supplementation.  Has remained with high oxygen requirements that worsened today prompting critical care evaluation. Overall patient says he feels about the same in terms of breathing status.  He has dyspnea with any exertion.  He is trying to lie prone but this is difficult and painful.  Denies any significant cough or chest pain.    Past Medical History  Pacemaker Hypertension Acquired hypothyroidism Hyperlipidemia BPH  Significant Hospital Events   12/31/2018 admitted  Consults:  PCCM  Procedures:  None  Significant Diagnostic Tests:  Chest x-ray bilateral airspace disease worse on right  Micro Data:  12/8 Pct neg  Antimicrobials:  5 days remdesivir completed 12/12   Interim history/subjective:  No events overnight, lying on his right side.  Dyspneic with minimal exertion.  Objective   Blood pressure 120/67, pulse 98, temperature 97.9 F (36.6 C), temperature source Axillary, resp. rate (!) 25, height 5\' 11"  (1.803 m), weight 121.3 kg, SpO2 (!) 87 %.    FiO2 (%):  [100 %] 100 %   Intake/Output Summary (Last 24 hours) at 01/06/2019 [id number] Last data filed at 01/06/2019 0700 Gross per 24 hour  Intake 750 ml  Output 2950 ml  Net -2200 ml   Filed Weights   01/02/19 0339 01/03/19 0500 01/06/19 0353  Weight: 122.5 kg 120.9 kg 121.3 kg    Examination: GEN: elderly man lying on right side, facial plethora  noted HEENT: NRB in place, trachea midline CV: RRR, ext warm PULM: Scattered rhonci, mild tachynea GI: Soft, hypoactive BS EXT: trace edema NEURO: Moves all 4 ext to command PSYCH: Good insight SKIN: No rashes  Creatinine up slightly, BNP only minimally elevated, procalcitonin negative, CRP negative, worsening polycythemia likely hemoconcentration CBGs okay Net -2.3L yesterday  Resolved Hospital Problem list   N/A  Assessment & Plan:  # Acute hypoxemic respiratory failure secondary to Covid ARDS-persistent with question of transition into fibroproliferative phase, I see no evidence of superimposed bacterial infection.  He does not look particularly fluid overloaded but a trial of diuresis is reasonable.  Inflammatory markers and d dimer low. - Continue awake proning as able - Encourage incentive spirometry - Encourage up to chair - Decision to intubate will be based on clinical gestalt and mental status. - Steroid taper is reasonable as ordered 12/21: hold further diuresis, this is going to be a long road to recovery barring any complications   # Steroid-induced hyperglycemia- on levemir/SSI, monitor and adjust PRN  # Acquired hypothyroidism- last TSH not too bad, continue synthroid  Best practice:  Diet: Diabetic Pain/Anxiety/Delirium protocol (if indicated): N/A VAP protocol (if indicated): N/A DVT prophylaxis: full dose lovenox (afib) GI prophylaxis: PPI Glucose control: levemir and SSI Mobility: up to chair Code Status: full code Family Communication: per primary Disposition:  ICU, high risk for intubation given O2 needs

## 2019-01-06 NOTE — Progress Notes (Signed)
OT Note  Patient Details Name: Arthur Daniel MRN: [id number] DOB: Sep 14, 1940  Ordered acknowledged. PT to see pt today with OT to evaluate tomorrow as appropriate.  Mauri Brooklyn 01/06/2019, 1:06 PM

## 2019-01-06 NOTE — Progress Notes (Addendum)
PROGRESS NOTE    Arthur Daniel  [id number] DOB: 02-19-1940 DOA: 01/04/2019 PCP: Arthur Pal, DO   Brief Narrative:  77 year old male with history of hyperlipidemia, Graves' disease status post treatment now hypothyroid, pacemaker, who was admitted to the hospital on 12/23/2018 with acute hypoxic respiratory failure in the setting of COVID-19 viral illness.  He was treated with remdesivir, steroids, Actemra.  He was stable on the floor up until 12/31/2018 when he had an episode of atrial fibrillation with RVR, followed by worsening hypoxia as well as chest pain and required transfer to the ICU.  Assessment & Plan:   Principal Problem:   COVID-19 Active Problems:   Hypothyroidism   Essential hypertension   Cardiac pacemaker in situ   BPH (benign prostatic hyperplasia)   Anxiety   Acute respiratory failure with hypoxia (HCC)   Dyslipidemia   Pneumonia due to COVID-19 virus   Gastroenteritis due to COVID-19 virus   Thrombocytopenia (HCC)   Emphysema of lung (HCC)   Atrial fibrillation with RVR (HCC)   Hyperglycemia  Acute Hypoxic Respiratory Failure due to Covid-19 Viral Illness -Significant increase in O2 needs overnight.  BNP is normal.  Inflammatory labs stable.  Now requiring 50 L HFNC and NRB.  Sats remain in the 80's.   - appreciate PCCM assistance - CXR 12/20 with bilateral opacities with R sided worsening - completed remdesivir - steroids completed 12/21 - received Actemra on 12/9 -OK with mild hypoxemia, goal at rest is > 85% SaO2, with movement ideally > 75% -encouraged the patient to sit up in chair in the daytime use I-S and flutter valve for pulmonary toiletry and then prone in bed when at night. -Maintain negative fluid balance, currently is net -8.6.  Holding diuresis for now. - repeat CXR tomorrow, 12/22.  COVID-19 Labs  Recent Labs    01/04/19 0414 01/05/19 0245 01/06/19 0302  DDIMER 0.43 0.30 0.53*  FERRITIN 627* 519* 586*  CRP <0.5 0.8  <0.5    No results found for: SARSCOV2NAA  Arthur Daniel. fib with RVR -Patient had previously Graves' disease, and had an episode of Arthur Daniel. fib in that setting.  When seen cardiology as an outpatient did not recommend anticoagulation given limited Arthur Daniel. fib.  He has had recurrent Arthur Daniel. fib with RVR during this hospitalization and will start on anticoagulation.  This was discussed with the patient by one of previous hospitalist.  He agrees, he has no bleeding history.  Continue therapeutic Lovenox, monitor H&H but clinically no evidence of bleeding.  Chadsvasc is 3. -His home metoprolol has been resumed -consider transition to doac -currently in sinus rhythm  Essential hypertension -Continue metoprolol  Hypothyroidism, history of Graves' disease -Continue Synthroid  Hyperlipidemia -Continue Zetia  Prediabetes  Steroid and Stress Induced Hyperglycemia:  Continue SSI, basal insulin adjust as needed  Elevated LFT's: likely related to COVID, follow.  Follow acute hepatitis panel (negative).  Polycythemia  Leukocytosis: WBC related to steroids, elevated Hb may be related to diuresis.  Follow.    DVT prophylaxis: lovenox full dose Code Status: full  Family Communication: none at bedside - wife over phone 12/18, no answer 12/19 - 12/20 discussed with wife 12/22 Disposition Plan: pending  Consultants:   none  Procedures:   none  Antimicrobials:  Anti-infectives (From admission, onward)   Start     Dose/Rate Route Frequency Ordered Stop   12/25/18 1000  remdesivir 100 mg in sodium chloride 0.9 % 100 mL IVPB  Status:  Discontinued  100 mg 200 mL/hr over 30 Minutes Intravenous Daily 01/11/2019 1625 01/14/2019 2257   12/25/18 1000  remdesivir 100 mg in sodium chloride 0.9 % 100 mL IVPB     100 mg 200 mL/hr over 30 Minutes Intravenous Daily 12/28/2018 2257 12/29/18 1216   12/26/2018 2330  remdesivir 200 mg in sodium chloride 0.9% 250 mL IVPB     200 mg 580 mL/hr over 30 Minutes Intravenous Once  01/12/2019 2257 12/25/18 0029   01/09/2019 1630  remdesivir 200 mg in sodium chloride 0.9% 250 mL IVPB  Status:  Discontinued     200 mg 580 mL/hr over 30 Minutes Intravenous Once 01/04/2019 1625 01/05/2019 2257     Subjective: No new complaints He notes feeling about the same SOB unchanged   Objective: Vitals:   01/06/19 0800 01/06/19 0812 01/06/19 0820 01/06/19 0900  BP: (!) 90/49 120/67 120/67 124/69  Pulse: 94 98 (!) 101 99  Resp: (!) 22 (!) 25 (!) 26 (!) 24  Temp: 97.9 F (36.6 C)     TempSrc: Axillary     SpO2: (!) 85% (!) 87% (!) 85% (!) 85%  Weight:      Height:        Intake/Output Summary (Last 24 hours) at 01/06/2019 1043 Last data filed at 01/06/2019 0900 Gross per 24 hour  Intake 750 ml  Output 2775 ml  Net -2025 ml   Filed Weights   01/02/19 0339 01/03/19 0500 01/06/19 0353  Weight: 122.5 kg 120.9 kg 121.3 kg    Examination:  General: No acute distress. Cardiovascular: RRR. Lungs: mildly increased WOB on 50 L HFNC and NRB, lying prone, lungs without adventitious sounds Abdomen: Soft, nontender, nondistended  Neurological: Alert and oriented 3. Moves all extremities 4. Cranial nerves II through XII grossly intact. Skin: Warm and dry. No rashes or lesions. Extremities: No clubbing or cyanosis. No edema.   Data Reviewed: I have personally reviewed following labs and imaging studies  CBC: Recent Labs  Lab 01/02/19 0500 01/03/19 0245 01/04/19 0414 01/05/19 0245 01/06/19 0302  WBC 15.4* 14.6* 15.5* 14.3* 14.8*  NEUTROABS 13.4* 12.9* 14.6* 13.2* 13.5*  HGB 18.0* 18.5* 19.0* 18.8* 19.7*  HCT 53.5* 56.3* 56.0* 55.3* 58.2*  MCV 94.4 94.0 92.6 91.6 94.5  PLT 223 197 192 161 0000000   Basic Metabolic Panel: Recent Labs  Lab 12/30/18 1056 12/31/18 0315 01/01/19 0500 01/02/19 0500 01/03/19 0245 01/04/19 0414 01/05/19 0245 01/06/19 0302  NA 138 139 139 137 138 137 137 137  K 3.8 4.0 4.1 3.9 4.3 4.0 4.0 4.1  CL 97* 97* 97* 96* 96* 96* 99 96*  CO2 26  26 26 25 28 26 25 24   GLUCOSE 223* 252* 214* 220* 198* 243* 206* 145*  BUN 27* 36* 43* 42* 42* 50* 54* 51*  CREATININE 1.11 1.12 1.24 1.22 1.08 1.16 1.12 1.25*  CALCIUM 8.6* 8.9 8.8* 8.6* 8.7* 8.7* 8.5* 8.1*  MG 2.5* 2.7* 2.8* 2.8* 2.7* 2.8* 2.8* 2.7*  PHOS 2.8 3.6 4.7* 4.7* 4.5  --   --   --    GFR: Estimated Creatinine Clearance: 66.6 mL/min (Micharl Helmes) (by C-G formula based on SCr of 1.25 mg/dL (H)). Liver Function Tests: Recent Labs  Lab 01/02/19 0500 01/03/19 0245 01/04/19 0414 01/05/19 0245 01/06/19 0302  AST 42* 41 40 38 41  ALT 83* 92* 111* 98* 101*  ALKPHOS 46 43 46 45 49  BILITOT 0.8 1.2 1.3* 0.9 1.0  PROT 6.2* 6.2* 6.3* 6.0* 6.0*  ALBUMIN 3.4* 3.4*  3.5 3.1* 3.1*   No results for input(s): LIPASE, AMYLASE in the last 168 hours. No results for input(s): AMMONIA in the last 168 hours. Coagulation Profile: No results for input(s): INR, PROTIME in the last 168 hours. Cardiac Enzymes: No results for input(s): CKTOTAL, CKMB, CKMBINDEX, TROPONINI in the last 168 hours. BNP (last 3 results) No results for input(s): PROBNP in the last 8760 hours. HbA1C: No results for input(s): HGBA1C in the last 72 hours. CBG: Recent Labs  Lab 01/05/19 1605 01/05/19 2031 01/06/19 0048 01/06/19 0333 01/06/19 0749  GLUCAP 227* 162* 135* 153* 164*   Lipid Profile: No results for input(s): CHOL, HDL, LDLCALC, TRIG, CHOLHDL, LDLDIRECT in the last 72 hours. Thyroid Function Tests: No results for input(s): TSH, T4TOTAL, FREET4, T3FREE, THYROIDAB in the last 72 hours. Anemia Panel: Recent Labs    01/05/19 0245 01/06/19 0302  FERRITIN 519* 586*   Sepsis Labs: Recent Labs  Lab 01/06/19 0302  PROCALCITON 0.13    Recent Results (from the past 240 hour(s))  MRSA PCR Screening     Status: None   Collection Time: 01/01/19  5:13 AM   Specimen: Nasal Mucosa; Nasopharyngeal  Result Value Ref Range Status   MRSA by PCR NEGATIVE NEGATIVE Final    Comment:        The GeneXpert MRSA Assay  (FDA approved for NASAL specimens only), is one component of Kahmya Pinkham comprehensive MRSA colonization surveillance program. It is not intended to diagnose MRSA infection nor to guide or monitor treatment for MRSA infections. Performed at [facility], [place] [address] [place],  [address]          Radiology Studies: DG CHEST PORT 1 VIEW  Result Date: 01/05/2019 CLINICAL DATA:  Shortness of breath.  COVID-19 EXAM: PORTABLE CHEST 1 VIEW COMPARISON:  Radiograph 12/31/2018 FINDINGS: Left-sided pacemaker in place. Bilateral heterogeneous lung opacities with slight worsening on the right in the interim. No pneumothorax. No evidence of pleural effusion. Unchanged heart size. IMPRESSION: Bilateral heterogeneous lung opacities with slight worsening on the right over the past 5 days. Electronically Signed   By: Keith Rake M.D.   On: 01/05/2019 04:55        Scheduled Meds: . alfuzosin  10 mg Oral QHS  . chlorhexidine  15 mL Mouth Rinse BID  . Chlorhexidine Gluconate Cloth  6 each Topical Daily  . dextromethorphan-guaiFENesin  1 tablet Oral BID  . enoxaparin (LOVENOX) injection  120 mg Subcutaneous Q12H  . ezetimibe  10 mg Oral Daily  . feeding supplement (ENSURE ENLIVE)  237 mL Oral TID BM  . insulin aspart  0-15 Units Subcutaneous Q4H  . insulin detemir  11 Units Subcutaneous BID  . levothyroxine  175 mcg Oral QAC breakfast  . loratadine  10 mg Oral Daily  . mouth rinse  15 mL Mouth Rinse q12n4p  . metoprolol tartrate  25 mg Oral BID  . mirabegron ER  50 mg Oral Daily  . pantoprazole  40 mg Oral Daily  . vitamin C  500 mg Oral Daily  . zinc sulfate  220 mg Oral Daily   Continuous Infusions: . sodium chloride 250 mL (12/26/18 0912)     LOS: 13 days    Time spent: over 30 min 45 min critical care time with severe AHRF 2/2 COVID 19 infection   Fayrene Helper, MD [facility] Pager AMION  If 7PM-7AM, please contact  night-coverage [email or web address] Password [facility] 01/06/2019, 10:43 AM

## 2019-01-07 ENCOUNTER — Inpatient Hospital Stay (HOSPITAL_COMMUNITY): Payer: Medicare HMO

## 2019-01-07 LAB — COMPREHENSIVE METABOLIC PANEL
ALT: 105 U/L — ABNORMAL HIGH (ref 0–44)
AST: 42 U/L — ABNORMAL HIGH (ref 15–41)
Albumin: 3 g/dL — ABNORMAL LOW (ref 3.5–5.0)
Alkaline Phosphatase: 47 U/L (ref 38–126)
Anion gap: 13 (ref 5–15)
BUN: 46 mg/dL — ABNORMAL HIGH (ref 8–23)
CO2: 27 mmol/L (ref 22–32)
Calcium: 8.1 mg/dL — ABNORMAL LOW (ref 8.9–10.3)
Chloride: 96 mmol/L — ABNORMAL LOW (ref 98–111)
Creatinine, Ser: 0.98 mg/dL (ref 0.61–1.24)
GFR calc Af Amer: >60 mL/min (ref >60)
GFR calc non Af Amer: >60 mL/min (ref >60)
Glucose, Bld: 124 mg/dL — ABNORMAL HIGH (ref 70–99)
Potassium: 3.9 mmol/L (ref 3.5–5.1)
Sodium: 136 mmol/L (ref 135–145)
Total Bilirubin: 1.4 mg/dL — ABNORMAL HIGH (ref 0.3–1.2)
Total Protein: 5.9 g/dL — ABNORMAL LOW (ref 6.5–8.1)

## 2019-01-07 LAB — CBC WITH DIFFERENTIAL/PLATELET
Abs Immature Granulocytes: 0.13 10*3/uL — ABNORMAL HIGH (ref 0.00–0.07)
Basophils Absolute: 0 10*3/uL (ref 0.0–0.1)
Basophils Relative: 0 %
Eosinophils Absolute: 0.1 10*3/uL (ref 0.0–0.5)
Eosinophils Relative: 1 %
HCT: 56.3 % — ABNORMAL HIGH (ref 39.0–52.0)
Hemoglobin: 19.2 g/dL — ABNORMAL HIGH (ref 13.0–17.0)
Immature Granulocytes: 1 %
Lymphocytes Relative: 4 %
Lymphs Abs: 0.5 10*3/uL — ABNORMAL LOW (ref 0.7–4.0)
MCH: 31.7 pg (ref 26.0–34.0)
MCHC: 34.1 g/dL (ref 30.0–36.0)
MCV: 92.9 fL (ref 80.0–100.0)
Monocytes Absolute: 0.4 10*3/uL (ref 0.1–1.0)
Monocytes Relative: 3 %
Neutro Abs: 11.6 10*3/uL — ABNORMAL HIGH (ref 1.7–7.7)
Neutrophils Relative %: 91 %
Platelets: 165 10*3/uL (ref 150–400)
RBC: 6.06 MIL/uL — ABNORMAL HIGH (ref 4.22–5.81)
RDW: 13 % (ref 11.5–15.5)
WBC: 12.8 10*3/uL — ABNORMAL HIGH (ref 4.0–10.5)
nRBC: 0 % (ref 0.0–0.2)

## 2019-01-07 LAB — PROCALCITONIN: Procalcitonin: 0.12 ng/mL

## 2019-01-07 LAB — GLUCOSE, CAPILLARY
Glucose-Capillary: 125 mg/dL — ABNORMAL HIGH (ref 70–99)
Glucose-Capillary: 122 mg/dL — ABNORMAL HIGH (ref 70–99)
Glucose-Capillary: 195 mg/dL — ABNORMAL HIGH (ref 70–99)
Glucose-Capillary: 263 mg/dL — ABNORMAL HIGH (ref 70–99)
Glucose-Capillary: 224 mg/dL — ABNORMAL HIGH (ref 70–99)

## 2019-01-07 LAB — MAGNESIUM: Magnesium: 2.8 mg/dL — ABNORMAL HIGH (ref 1.7–2.4)

## 2019-01-07 LAB — FERRITIN: Ferritin: 554 ng/mL — ABNORMAL HIGH (ref 24–336)

## 2019-01-07 LAB — C-REACTIVE PROTEIN: CRP: 0.6 mg/dL (ref <1.0)

## 2019-01-07 LAB — D-DIMER, QUANTITATIVE: D-Dimer, Quant: 0.64 ug/mL-FEU — ABNORMAL HIGH (ref 0.00–0.50)

## 2019-01-07 MED ORDER — TRAZODONE HCL 50 MG PO TABS
50.0000 mg | ORAL_TABLET | Freq: Every day | ORAL | Status: DC
  Administered 2019-01-07 – 2019-01-08 (×2): 50 mg via ORAL
  Filled 2019-01-07 (×2): qty 1

## 2019-01-07 NOTE — Progress Notes (Signed)
Nutrition Follow-up  RD working remotely.  DOCUMENTATION CODES:   Morbid obesity  INTERVENTION:    Liberalize diet to regular.  Continue to offer Ensure Enlive TID, each supplement provides 350 kcal and 20 grams of protein.  Continue Magic cup BID with lunch and dinner, each supplement provides 290 kcal and 9 grams of protein, automatically on meal trays to optimize nutritional intake.    Recommend Cortrak placement (service available Wednesday Dec 23). Osmolite 1.5 at 70 ml/h with Pro-stat 30 ml TID would provide 2820 kcal, 150 gm protein, 1280 ml free water daily  NUTRITION DIAGNOSIS:   Increased nutrient needs related to catabolic [id number]) as evidenced by estimated needs.  Ongoing   GOAL:   Patient will meet greater than or equal to 90% of their needs  Unmet  MONITOR:   PO intake, Supplement acceptance, Labs, Weight trends, I & O's  ASSESSMENT:   78 year old male with PMHx of HLD, HTN, postablative hypothyroidism, diverticulosis, sleep apnea, A-fib, hx pacemaker placement in 2010 who presented after 3 weeks of epigastric pain and non-bloody diarrhea (4-6 episodes daily) and shortness of breath found to have COVID-19 viral infection.   Patient remains in the ICU. Currently receiving 45 L heated HFNC and 15 L NRB. Meal intakes documented at 0% for all meals since 12/18. He is being offered Ensure Enlive supplements TID between meals; has been drinking 1-2 per day.   Medications reviewed and include Novolog, Levemir, vitamin C, zinc sulfate.  Labs reviewed. CBG's: 125-122-195  Admission weight 143.8 kg (12/8) Current weight 116.61 kg (12/22) I/O -9.4 L  Diet Order:   Diet Order            Diet Carb Modified Fluid consistency: Thin; Room service appropriate? Yes  Diet effective now             EDUCATION NEEDS:   No education needs have been identified at this time  Skin:  Skin Assessment: Reviewed RN Assessment  Last BM:  12/19 type  3  Height:   Ht Readings from Last 1 Encounters:  01/14/2019 5\' 11"  (1.803 m)   Weight:   Wt Readings from Last 1 Encounters:  01/07/19 116.6 kg   Ideal Body Weight:  78.2 kg  BMI:  Body mass index is 35.85 kg/m.  Estimated Nutritional Needs:   Kcal:  [id number]  Protein:  >/= 145 grams  Fluid:  2.3 L/day   Molli Barrows, RD, LDN, [place] Pager [phone] After Hours Pager [phone]

## 2019-01-07 NOTE — Progress Notes (Signed)
Patient seems to be steady in respiratory status.  Will follow peripherally, call if needed sooner.  Erskine Emery MD PCCM

## 2019-01-07 NOTE — Progress Notes (Signed)
PROGRESS NOTE    Arthur Daniel  [id number] DOB: 1940/09/29 DOA: 12/28/2018 PCP: Shelda Pal, DO   Brief Narrative:  78 year old male with history of hyperlipidemia, Graves' disease status post treatment now hypothyroid, pacemaker, who was admitted to the hospital on 01/11/2019 with acute hypoxic respiratory failure in the setting of COVID-19 viral illness.  He was treated with remdesivir, steroids, Actemra.  He was stable on the floor up until 12/31/2018 when he had an episode of atrial fibrillation with RVR, followed by worsening hypoxia as well as chest pain and required transfer to the ICU.  Assessment & Plan:   Principal Problem:   COVID-19 Active Problems:   Hypothyroidism   Essential hypertension   Cardiac pacemaker in situ   BPH (benign prostatic hyperplasia)   Anxiety   Acute respiratory failure with hypoxia (HCC)   Dyslipidemia   Pneumonia due to COVID-19 virus   Gastroenteritis due to COVID-19 virus   Thrombocytopenia (HCC)   Emphysema of lung (HCC)   Atrial fibrillation with RVR (HCC)   Hyperglycemia  Acute Hypoxic Respiratory Failure due to Covid-19 Viral Illness -Significant increase in O2 needs overnight.  BNP is normal.  Inflammatory labs stable.  Now requiring 45L HFNC and NRB -> satting in low 90's, mild improvement, but remains critically ill with severe hypoxia - He's at high risk for intubation, will reconsult PCCM as needed - CXR 12/20 with bilateral opacities with R sided worsening - completed remdesivir - steroids completed 12/21 - received Actemra on 12/9 -OK with mild hypoxemia, goal at rest is > 85% SaO2, with movement ideally > 75% -encouraged the patient to sit up in chair in the daytime use I-S and flutter valve for pulmonary toiletry and then prone in bed when at night. -Maintain negative fluid balance, currently is net -9.2.  Holding diuresis for now. - repeat CXR tomorrow, 12/22 -> bilateral pulm infiltrates  COVID-19  Labs  Recent Labs    01/05/19 0245 01/06/19 0302 01/07/19 0259  DDIMER 0.30 0.53* 0.64*  FERRITIN 519* 586* 554*  CRP 0.8 <0.5 0.6    No results found for: SARSCOV2NAA  Mimi Debellis. fib with RVR -Patient had previously Graves' disease, and had an episode of Benigna Delisi. fib in that setting.  When seen cardiology as an outpatient did not recommend anticoagulation given limited Patrece Tallie. fib.  He has had recurrent Smriti Barkow. fib with RVR during this hospitalization and will start on anticoagulation.  This was discussed with the patient by one of previous hospitalist.  He agrees, he has no bleeding history.  Continue therapeutic Lovenox, monitor H&H but clinically no evidence of bleeding.  Chadsvasc is 3. -His home metoprolol has been resumed -consider transition to doac -currently in sinus rhythm  Essential hypertension -Continue metoprolol  Hypothyroidism, history of Graves' disease -Continue Synthroid  Hyperlipidemia -Continue Zetia  Prediabetes  Steroid and Stress Induced Hyperglycemia:  Continue SSI, basal insulin adjust as needed  Elevated LFT's: likely related to COVID, follow.  Follow acute hepatitis panel (negative).  Polycythemia  Leukocytosis: WBC related to steroids, elevated Hb may be related to diuresis.  Follow.    DVT prophylaxis: lovenox full dose Code Status: full  Family Communication: none at bedside - wife over phone 12/18, no answer 12/19 - 12/20 discussed with wife 12/21 Disposition Plan: pending  Consultants:   none  Procedures:   none  Antimicrobials:  Anti-infectives (From admission, onward)   Start     Dose/Rate Route Frequency Ordered Stop   12/25/18 1000  remdesivir 100  mg in sodium chloride 0.9 % 100 mL IVPB  Status:  Discontinued     100 mg 200 mL/hr over 30 Minutes Intravenous Daily 12/20/2018 1625 12/22/2018 2257   12/25/18 1000  remdesivir 100 mg in sodium chloride 0.9 % 100 mL IVPB     100 mg 200 mL/hr over 30 Minutes Intravenous Daily 12/31/2018 2257 12/29/18 1216    01/05/2019 2330  remdesivir 200 mg in sodium chloride 0.9% 250 mL IVPB     200 mg 580 mL/hr over 30 Minutes Intravenous Once 01/04/2019 2257 12/25/18 0029   12/20/2018 1630  remdesivir 200 mg in sodium chloride 0.9% 250 mL IVPB  Status:  Discontinued     200 mg 580 mL/hr over 30 Minutes Intravenous Once 01/11/2019 1625 12/27/2018 2257     Subjective: C/o continued SOB Decreased appetite  Objective: Vitals:   01/07/19 0600 01/07/19 0700 01/07/19 0752 01/07/19 0934  BP: (!) 110/50 108/65 108/65 98/75  Pulse: 91 84 95 98  Resp: 16  20   Temp:      TempSrc:      SpO2: 90% 92% 92%   Weight:      Height:        Intake/Output Summary (Last 24 hours) at 01/07/2019 1108 Last data filed at 01/07/2019 0500 Gross per 24 hour  Intake 200 ml  Output 815 ml  Net -615 ml   Filed Weights   01/03/19 0500 01/06/19 0353 01/07/19 0456  Weight: 120.9 kg 121.3 kg 116.6 kg    Examination:  General: No acute distress. Cardiovascular: RRR Lungs: CTAB, slightly increased WOB Abdomen: Soft, nontender, nondistended Neurological: Alert and oriented 3. Moves all extremities 4. Cranial nerves II through XII grossly intact. Skin: Warm and dry. No rashes or lesions. Extremities: No clubbing or cyanosis. No edema.   Data Reviewed: I have personally reviewed following labs and imaging studies  CBC: Recent Labs  Lab 01/03/19 0245 01/04/19 0414 01/05/19 0245 01/06/19 0302 01/07/19 0259  WBC 14.6* 15.5* 14.3* 14.8* 12.8*  NEUTROABS 12.9* 14.6* 13.2* 13.5* 11.6*  HGB 18.5* 19.0* 18.8* 19.7* 19.2*  HCT 56.3* 56.0* 55.3* 58.2* 56.3*  MCV 94.0 92.6 91.6 94.5 92.9  PLT 197 192 161 176 [id number]   Basic Metabolic Panel: Recent Labs  Lab 01/01/19 0500 01/02/19 0500 01/03/19 0245 01/04/19 0414 01/05/19 0245 01/06/19 0302 01/07/19 0259  NA 139 137 138 137 137 137 136  K 4.1 3.9 4.3 4.0 4.0 4.1 3.9  CL 97* 96* 96* 96* 99 96* 96*  CO2 26 25 28 26 25 24 27   GLUCOSE 214* 220* 198* 243* 206* 145* 124*   BUN 43* 42* 42* 50* 54* 51* 46*  CREATININE 1.24 1.22 1.08 1.16 1.12 1.25* 0.98  CALCIUM 8.8* 8.6* 8.7* 8.7* 8.5* 8.1* 8.1*  MG 2.8* 2.8* 2.7* 2.8* 2.8* 2.7* 2.8*  PHOS 4.7* 4.7* 4.5  --   --  4.3  --    GFR: Estimated Creatinine Clearance: 83.3 mL/min (by C-G formula based on SCr of 0.98 mg/dL). Liver Function Tests: Recent Labs  Lab 01/03/19 0245 01/04/19 0414 01/05/19 0245 01/06/19 0302 01/07/19 0259  AST 41 40 38 41 42*  ALT 92* 111* 98* 101* 105*  ALKPHOS 43 46 45 49 47  BILITOT 1.2 1.3* 0.9 1.0 1.4*  PROT 6.2* 6.3* 6.0* 6.0* 5.9*  ALBUMIN 3.4* 3.5 3.1* 3.1* 3.0*   No results for input(s): LIPASE, AMYLASE in the last 168 hours. No results for input(s): AMMONIA in the last 168 hours. Coagulation Profile:  No results for input(s): INR, PROTIME in the last 168 hours. Cardiac Enzymes: No results for input(s): CKTOTAL, CKMB, CKMBINDEX, TROPONINI in the last 168 hours. BNP (last 3 results) No results for input(s): PROBNP in the last 8760 hours. HbA1C: No results for input(s): HGBA1C in the last 72 hours. CBG: Recent Labs  Lab 01/06/19 1226 01/06/19 1659 01/06/19 1946 01/07/19 0049 01/07/19 0444  GLUCAP 155* 176* 134* 125* 122*   Lipid Profile: No results for input(s): CHOL, HDL, LDLCALC, TRIG, CHOLHDL, LDLDIRECT in the last 72 hours. Thyroid Function Tests: No results for input(s): TSH, T4TOTAL, FREET4, T3FREE, THYROIDAB in the last 72 hours. Anemia Panel: Recent Labs    01/06/19 0302 01/07/19 0259  FERRITIN 586* 554*   Sepsis Labs: Recent Labs  Lab 01/06/19 0302 01/07/19 0259  PROCALCITON 0.13 0.12    Recent Results (from the past 240 hour(s))  MRSA PCR Screening     Status: None   Collection Time: 01/01/19  5:13 AM   Specimen: Nasal Mucosa; Nasopharyngeal  Result Value Ref Range Status   MRSA by PCR NEGATIVE NEGATIVE Final    Comment:        The GeneXpert MRSA Assay (FDA approved for NASAL specimens only), is one component of Denzel Etienne comprehensive  MRSA colonization surveillance program. It is not intended to diagnose MRSA infection nor to guide or monitor treatment for MRSA infections. Performed at [facility], [place] [address] [place], [place] [address]          Radiology Studies: DG CHEST PORT 1 VIEW  Result Date: 01/07/2019 CLINICAL DATA:  Hypoxia. EXAM: PORTABLE CHEST 1 VIEW COMPARISON:  01/05/2019. FINDINGS: Cardiac pacer stable position. Heart size normal. Diffuse severe bilateral pulmonary infiltrates again noted. No interim change. No pleural effusion or pneumothorax. IMPRESSION: 1.  Cardiac pacer in stable position.  Heart size stable. 2. Diffuse severe bilateral pulmonary infiltrates again noted. No interim change. Electronically Signed   By: [place]   On: 01/07/2019 07:12        Scheduled Meds: . alfuzosin  10 mg Oral QHS  . chlorhexidine  15 mL Mouth Rinse BID  . Chlorhexidine Gluconate Cloth  6 each Topical Daily  . dextromethorphan-guaiFENesin  1 tablet Oral BID  . enoxaparin (LOVENOX) injection  120 mg Subcutaneous Q12H  . ezetimibe  10 mg Oral Daily  . feeding supplement (ENSURE ENLIVE)  237 mL Oral TID BM  . insulin aspart  0-15 Units Subcutaneous Q4H  . insulin detemir  11 Units Subcutaneous BID  . levothyroxine  175 mcg Oral QAC breakfast  . loratadine  10 mg Oral Daily  . mouth rinse  15 mL Mouth Rinse q12n4p  . metoprolol tartrate  25 mg Oral BID  . mirabegron ER  50 mg Oral Daily  . pantoprazole  40 mg Oral Daily  . traZODone  50 mg Oral QHS  . vitamin C  500 mg Oral Daily  . zinc sulfate  220 mg Oral Daily   Continuous Infusions: . sodium chloride 250 mL (12/26/18 0912)     LOS: 14 days    Time spent: over 30 min 45 min critical care with AHRF 2/2 COVID 19 infection   Fayrene Helper, MD [facility] Pager AMION  If 7PM-7AM, please contact night-coverage [email or web address] Password [email or web address] 01/07/2019, 11:08 AM

## 2019-01-07 NOTE — Evaluation (Signed)
Physical Therapy Evaluation Patient Details Name: JEVONTA OKRAY MRN: [id number] DOB: 10/04/1940 Today's Date: 01/07/2019   History of Present Illness  78 year old male with history of hyperlipidemia, Graves' disease status post treatment now hypothyroid, pacemaker, who was admitted to the hospital on 01/10/2019 with acute hypoxic respiratory failure in the setting of COVID-19 viral illness.  He was treated with remdesivir, steroids, Actemra.  He was stable on the floor up until 12/31/2018 when he had an episode of atrial fibrillation with RVR, followed by worsening hypoxia as well as chest pain and required transfer to the ICU.  Clinical Impression  The patient received in be on Heated HFNC 100%, 45 l. SPO2 AT REST 92%(EAR), RR 27, hr 80.  sitting x 8 minutes on bed edge, SPO2 80%, RR 34, HR 83. Patient reports weakness and SOB. Continue to progress activity as respiratory status allows.    Follow Up Recommendations CIR;Supervision - Intermittent    Equipment Recommendations  None recommended by PT    Recommendations for Other Services Rehab consult     Precautions / Restrictions Precautions Precautions: Fall;Other (comment) Precaution Comments: 45L HHFNC at 100% + 15NRB Restrictions Weight Bearing Restrictions: No      Mobility  Bed Mobility Overal bed mobility: Needs Assistance Bed Mobility: Supine to Sit   Sidelying to sit: Supervision     Sit to sidelying: Supervision General bed mobility comments: assist with lines  Transfers                 General transfer comment: NT due to O2 requirements  Ambulation/Gait                Stairs            Wheelchair Mobility    Modified Rankin (Stroke Patients Only)       Balance Overall balance assessment: Needs assistance Sitting-balance support: Bilateral upper extremity supported;No upper extremity supported Sitting balance-Leahy Scale: Fair Sitting balance - Comments: propping on UE's while  stting,                                     Pertinent Vitals/Pain Pain Assessment: No/denies pain    Home Living Family/patient expects to be discharged to:: Private residence Living Arrangements: Spouse/significant other Available Help at Discharge: Family Type of Home: House Home Access: Level entry     Home Layout: One level Home Equipment: Shower seat      Prior Function Level of Independence: Independent         Comments: Pt independent in ADLs, IADLs, and mobility. Pt does not drive. Pt does not ambulate with an assistive device.     Hand Dominance   Dominant Hand: Right    Extremity/Trunk Assessment   Upper Extremity Assessment Upper Extremity Assessment: Generalized weakness    Lower Extremity Assessment Lower Extremity Assessment: Generalized weakness       Communication   Communication: Other (comment)(Diffculty due to SOB)  Cognition Arousal/Alertness: Awake/alert Behavior During Therapy: WFL for tasks assessed/performed Overall Cognitive Status: Within Functional Limits for tasks assessed                                 General Comments: Generally reports that he is weak and SOB      General Comments General comments (skin integrity, edema, etc.): (P) Pt tolerated sitting EOB 7 min with  supervision. SpO2 decreased to 83% while seated EOB. SpO2 at 87% at end of session with pt supine in bed. RR in 30s throughout.     Exercises Other Exercises Other Exercises: rformed IS x 5 x 2   Assessment/Plan    PT Assessment Patient needs continued PT services  PT Problem List Decreased strength;Decreased mobility;Decreased knowledge of precautions;Decreased activity tolerance;Cardiopulmonary status limiting activity;Decreased knowledge of use of DME       PT Treatment Interventions DME instruction;Therapeutic activities;Gait training;Therapeutic exercise;Patient/family education;Functional mobility training    PT Goals  (Current goals can be found in the Care Plan section)  Acute Rehab PT Goals Patient Stated Goal: agreed to sitting on bed edge PT Goal Formulation: With patient Time For Goal Achievement: 01/21/19 Potential to Achieve Goals: Good    Frequency Min 3X/week   Barriers to discharge        Co-evaluation PT/OT/SLP Co-Evaluation/Treatment: Yes Reason for Co-Treatment: Complexity of the patient's impairments (multi-system involvement);For patient/therapist safety PT goals addressed during session: Mobility/safety with mobility OT goals addressed during session: ADL's and self-care       AM-PAC PT "6 Clicks" Mobility  Outcome Measure Help needed turning from your back to your side while in a flat bed without using bedrails?: A Little Help needed moving from lying on your back to sitting on the side of a flat bed without using bedrails?: A Little Help needed moving to and from a bed to a chair (including a wheelchair)?: A Lot Help needed standing up from a chair using your arms (e.g., wheelchair or bedside chair)?: A Lot Help needed to walk in hospital room?: Total Help needed climbing 3-5 steps with a railing? : Total 6 Click Score: 12    End of Session Equipment Utilized During Treatment: Oxygen(NRB and heated HFNC) Activity Tolerance: Treatment limited secondary to medical complications (Comment) Patient left: in bed;with call bell/phone within reach Nurse Communication: Mobility status PT Visit Diagnosis: Difficulty in walking, not elsewhere classified (R26.2);Unsteadiness on feet (R26.81)    Time: [id number] PT Time Calculation (min) (ACUTE ONLY): 26 min   Charges:   PT Evaluation $PT Eval Moderate Complexity: [place] Pager [phone] Office [phone]   Claretha Cooper 01/07/2019, 1:50 PM

## 2019-01-07 NOTE — Progress Notes (Signed)
Occupational Therapy Evaluation Patient Details Name: Arthur Daniel MRN: [id number] DOB: November 05, 1940 Today's Date: 01/07/2019    History of Present Illness 78 year old male with history of hyperlipidemia, Graves' disease status post treatment now hypothyroid, pacemaker, who was admitted to the hospital on 12/19/2018 with acute hypoxic respiratory failure in the setting of COVID-19 viral illness.  He was treated with remdesivir, steroids, Actemra.  He was stable on the floor up until 12/31/2018 when he had an episode of atrial fibrillation with RVR, followed by worsening hypoxia as well as chest pain and required transfer to the ICU.   Clinical Impression   PTA pt lived at home with his wife, independent in ADL, IADL, and mobility tasks. Pt currently on 45L HHFNC at 100% FiO2 + 15L NRB with SpO2 at 92% at start of session. Pt currently requires setup to mod assist for self-care and functional transfer tasks. Pt tolerated sitting edge of bed 7 min with supervision, noting 0 instances of loss of balance. Encouraged pt to maintain upright posture and positioning while seated to increase lung expansion. Pt's SpO2 decreased to 83% while seated edge of bed. 3/4 DOE. Pt returned to bed with SpO2 at 87% at end of session. RR in 30s throughout. Pt demonstrates decreased strength, endurance, balance, standing tolerance, and activity tolerance impacting ability to complete self-care and functional transfer tasks. Recommend skilled OT services to address above deficits in order to promote function and prevent further decline. Pay may require SNF placement, depending on progress in therapy.     Follow Up Recommendations  Home health OT;Supervision/Assistance - 24 hour(pending progress, may require SNF)    Equipment Recommendations  Other (comment)(TBD)    Recommendations for Other Services       Precautions / Restrictions Precautions Precautions: Fall;Other (comment) Precaution Comments: 45L HHFNC at 100%  + 15NRB Restrictions Weight Bearing Restrictions: No      Mobility Bed Mobility Overal bed mobility: Needs Assistance Bed Mobility: Supine to Sit   Sidelying to sit: Supervision     Sit to sidelying: Supervision General bed mobility comments: assist with lines  Transfers                 General transfer comment: NT due to O2 requirements    Balance Overall balance assessment: Needs assistance Sitting-balance support: Bilateral upper extremity supported;No upper extremity supported Sitting balance-Leahy Scale: Fair Sitting balance - Comments: propping on UE's while stting,                                   ADL either performed or assessed with clinical judgement   ADL Overall ADL's : Needs assistance/impaired Eating/Feeding: Set up;Sitting   Grooming: Supervision/safety;Minimal assistance;Sitting   Upper Body Bathing: Minimal assistance;Sitting   Lower Body Bathing: Sitting/lateral leans;Moderate assistance   Upper Body Dressing : Minimal assistance;Sitting   Lower Body Dressing: Sitting/lateral leans;Moderate assistance   Toilet Transfer: Minimal assistance;BSC   Toileting- Clothing Manipulation and Hygiene: Sitting/lateral lean;Sit to/from stand;Moderate assistance       Functional mobility during ADLs: Minimal assistance       Vision         Perception     Praxis      Pertinent Vitals/Pain Pain Assessment: No/denies pain     Hand Dominance Right   Extremity/Trunk Assessment Upper Extremity Assessment Upper Extremity Assessment: Generalized weakness   Lower Extremity Assessment Lower Extremity Assessment: Generalized weakness  Communication Communication Communication: Other (comment)(Diffculty due to SOB)   Cognition Arousal/Alertness: Awake/alert Behavior During Therapy: WFL for tasks assessed/performed Overall Cognitive Status: Within Functional Limits for tasks assessed                                  General Comments: Generally reports that he is weak and SOB   General Comments  Pt tolerated sitting EOB 7 min with supervision. SpO2 decreased to 83% while seated EOB. SpO2 at 87% at end of session with pt supine in bed. RR in 30s throughout.     Exercises Exercises: Other exercises Other Exercises Other Exercises: rformed IS x 5 x 2   Shoulder Instructions      Home Living Family/patient expects to be discharged to:: Private residence Living Arrangements: Spouse/significant other Available Help at Discharge: Family Type of Home: House Home Access: Level entry     Home Layout: One level     Bathroom Shower/Tub: Tub/shower unit;Walk-in shower(Primarily uses walk-in shower)   Bathroom Toilet: Standard     Home Equipment: Shower seat          Prior Functioning/Environment Level of Independence: Independent        Comments: Pt independent in ADLs, IADLs, and mobility. Pt does not drive. Pt does not ambulate with an assistive device.        OT Problem List: Decreased strength;Decreased activity tolerance;Impaired balance (sitting and/or standing);Decreased knowledge of use of DME or AE;Cardiopulmonary status limiting activity      OT Treatment/Interventions: Self-care/ADL training;Therapeutic exercise;Neuromuscular education;Energy conservation;DME and/or AE instruction;Therapeutic activities;Patient/family education;Balance training    OT Goals(Current goals can be found in the care plan section) Acute Rehab OT Goals Patient Stated Goal: agreed to sitting on bed edge Time For Goal Achievement: 01/21/19 Potential to Achieve Goals: Good ADL Goals Pt Will Perform Grooming: with set-up;sitting Pt Will Perform Lower Body Bathing: with min assist;sit to/from stand;sitting/lateral leans Pt Will Perform Lower Body Dressing: with min assist;sit to/from stand;sitting/lateral leans Pt Will Transfer to Toilet: bedside commode;with min guard assist Pt Will  Perform Toileting - Clothing Manipulation and hygiene: with min guard assist;sit to/from stand Additional ADL Goal #1: Pt to tolerate sitting edge of bed 10 min with supervision, in preparation for ADLs.  OT Frequency: Min 2X/week   Barriers to D/C:            Co-evaluation PT/OT/SLP Co-Evaluation/Treatment: Yes Reason for Co-Treatment: Complexity of the patient's impairments (multi-system involvement);For patient/therapist safety PT goals addressed during session: Mobility/safety with mobility OT goals addressed during session: ADL's and self-care      AM-PAC OT "6 Clicks" Daily Activity     Outcome Measure Help from another person eating meals?: A Little Help from another person taking care of personal grooming?: A Little Help from another person toileting, which includes using toliet, bedpan, or urinal?: A Lot Help from another person bathing (including washing, rinsing, drying)?: A Lot Help from another person to put on and taking off regular upper body clothing?: A Little Help from another person to put on and taking off regular lower body clothing?: A Lot 6 Click Score: 15   End of Session Equipment Utilized During Treatment: Oxygen Nurse Communication: Mobility status  Activity Tolerance: Patient limited by fatigue(Limited by SOB) Patient left: in chair;with call bell/phone within reach  OT Visit Diagnosis: Unsteadiness on feet (R26.81);Muscle weakness (generalized) (M62.81)  Time: [id number] OT Time Calculation (min): 26 min Charges:  OT General Charges $OT Visit: 1 Visit OT Evaluation $OT Eval Moderate Complexity: 1 Mod  Mauri Brooklyn OTR/L [place] 01/07/2019, 1:55 PM

## 2019-01-08 ENCOUNTER — Inpatient Hospital Stay: Payer: Self-pay

## 2019-01-08 LAB — COMPREHENSIVE METABOLIC PANEL
ALT: 54 U/L — ABNORMAL HIGH (ref 0–44)
AST: 24 U/L (ref 15–41)
Albumin: 1.8 g/dL — ABNORMAL LOW (ref 3.5–5.0)
Alkaline Phosphatase: 33 U/L — ABNORMAL LOW (ref 38–126)
Anion gap: 7 (ref 5–15)
BUN: 52 mg/dL — ABNORMAL HIGH (ref 8–23)
CO2: 22 mmol/L (ref 22–32)
Calcium: 5.8 mg/dL — CL (ref 8.9–10.3)
Chloride: 113 mmol/L — ABNORMAL HIGH (ref 98–111)
Creatinine, Ser: 0.97 mg/dL (ref 0.61–1.24)
GFR calc Af Amer: >60 mL/min (ref >60)
GFR calc non Af Amer: >60 mL/min (ref >60)
Glucose, Bld: 171 mg/dL — ABNORMAL HIGH (ref 70–99)
Potassium: 2.7 mmol/L — CL (ref 3.5–5.1)
Sodium: 142 mmol/L (ref 135–145)
Total Bilirubin: 0.6 mg/dL (ref 0.3–1.2)
Total Protein: 3.7 g/dL — ABNORMAL LOW (ref 6.5–8.1)

## 2019-01-08 LAB — FERRITIN: Ferritin: 407 ng/mL — ABNORMAL HIGH (ref 24–336)

## 2019-01-08 LAB — BASIC METABOLIC PANEL
Anion gap: 13 (ref 5–15)
BUN: 68 mg/dL — ABNORMAL HIGH (ref 8–23)
CO2: 29 mmol/L (ref 22–32)
Calcium: 8.8 mg/dL — ABNORMAL LOW (ref 8.9–10.3)
Chloride: 97 mmol/L — ABNORMAL LOW (ref 98–111)
Creatinine, Ser: 1.29 mg/dL — ABNORMAL HIGH (ref 0.61–1.24)
GFR calc Af Amer: >60 mL/min (ref >60)
GFR calc non Af Amer: 53 mL/min — ABNORMAL LOW (ref >60)
Glucose, Bld: 210 mg/dL — ABNORMAL HIGH (ref 70–99)
Potassium: 4 mmol/L (ref 3.5–5.1)
Sodium: 139 mmol/L (ref 135–145)

## 2019-01-08 LAB — CBC WITH DIFFERENTIAL/PLATELET
Abs Immature Granulocytes: 0.12 10*3/uL — ABNORMAL HIGH (ref 0.00–0.07)
Basophils Absolute: 0 10*3/uL (ref 0.0–0.1)
Basophils Relative: 0 %
Eosinophils Absolute: 0 10*3/uL (ref 0.0–0.5)
Eosinophils Relative: 0 %
HCT: 56.1 % — ABNORMAL HIGH (ref 39.0–52.0)
Hemoglobin: 19 g/dL — ABNORMAL HIGH (ref 13.0–17.0)
Immature Granulocytes: 1 %
Lymphocytes Relative: 3 %
Lymphs Abs: 0.4 10*3/uL — ABNORMAL LOW (ref 0.7–4.0)
MCH: 31.5 pg (ref 26.0–34.0)
MCHC: 33.9 g/dL (ref 30.0–36.0)
MCV: 93 fL (ref 80.0–100.0)
Monocytes Absolute: 0.4 10*3/uL (ref 0.1–1.0)
Monocytes Relative: 3 %
Neutro Abs: 12.4 10*3/uL — ABNORMAL HIGH (ref 1.7–7.7)
Neutrophils Relative %: 93 %
Platelets: 171 10*3/uL (ref 150–400)
RBC: 6.03 MIL/uL — ABNORMAL HIGH (ref 4.22–5.81)
RDW: 13.2 % (ref 11.5–15.5)
WBC: 13.3 10*3/uL — ABNORMAL HIGH (ref 4.0–10.5)
nRBC: 0.2 % (ref 0.0–0.2)

## 2019-01-08 LAB — PHOSPHORUS: Phosphorus: 3.6 mg/dL (ref 2.5–4.6)

## 2019-01-08 LAB — GLUCOSE, CAPILLARY
Glucose-Capillary: 128 mg/dL — ABNORMAL HIGH (ref 70–99)
Glucose-Capillary: 148 mg/dL — ABNORMAL HIGH (ref 70–99)
Glucose-Capillary: 180 mg/dL — ABNORMAL HIGH (ref 70–99)
Glucose-Capillary: 183 mg/dL — ABNORMAL HIGH (ref 70–99)
Glucose-Capillary: 186 mg/dL — ABNORMAL HIGH (ref 70–99)
Glucose-Capillary: 205 mg/dL — ABNORMAL HIGH (ref 70–99)
Glucose-Capillary: 232 mg/dL — ABNORMAL HIGH (ref 70–99)
Glucose-Capillary: 236 mg/dL — ABNORMAL HIGH (ref 70–99)

## 2019-01-08 LAB — MAGNESIUM: Magnesium: 2.1 mg/dL (ref 1.7–2.4)

## 2019-01-08 LAB — C-REACTIVE PROTEIN: CRP: 5.3 mg/dL — ABNORMAL HIGH (ref <1.0)

## 2019-01-08 LAB — D-DIMER, QUANTITATIVE
D-Dimer, Quant: 0.49 ug/mL-FEU (ref 0.00–0.50)
D-Dimer, Quant: 0.62 ug/mL-FEU — ABNORMAL HIGH (ref 0.00–0.50)

## 2019-01-08 MED ORDER — PRO-STAT SUGAR FREE PO LIQD
30.0000 mL | Freq: Three times a day (TID) | ORAL | Status: DC
  Administered 2019-01-08 (×2): 30 mL
  Filled 2019-01-08: qty 30

## 2019-01-08 MED ORDER — POTASSIUM CHLORIDE 10 MEQ/100ML IV SOLN
10.0000 meq | INTRAVENOUS | Status: DC
  Filled 2019-01-08 (×2): qty 100

## 2019-01-08 MED ORDER — OSMOLITE 1.5 CAL PO LIQD: 1000.0000 mL | ORAL | Status: DC

## 2019-01-08 NOTE — Progress Notes (Signed)
Patient desat into 60s on HFNC 50L/100% and NRB 15L. Patient has an order for BIPAP at night. RT placed patient on BIPAP 18/10 and 100%. Patients SPO2 is now 85-88%. RN called [facility] and talked to [facility] RN to inform MD that this patient is requiring a lot more support.

## 2019-01-08 NOTE — Procedures (Signed)
Cortrak  Person Inserting Tube:  Donnalynn Wheeless C, RD Tube Type:  Cortrak - 43 inches Tube Location:  Left nare Initial Placement:  Stomach Secured by: Bridle Technique Used to Measure Tube Placement:  Documented cm marking at nare/ corner of mouth Cortrak Secured At:  76 cm    Cortrak Tube Team Note:  Consult received to place a Cortrak feeding tube.   No x-ray is required. RN may begin using tube.   If the tube becomes dislodged please keep the tube and contact the Cortrak team at [email or web address] (password [email or web address]) for replacement.  If after hours and replacement cannot be delayed, place a NG tube and confirm placement with an abdominal x-ray.    Alia Parsley RD, LDN, CNSC [phone] Pager [phone] After Hours Pager   

## 2019-01-08 NOTE — Plan of Care (Signed)

## 2019-01-08 NOTE — Progress Notes (Addendum)
PROGRESS NOTE    Arthur Daniel  [id number] DOB: December 18, 1940 DOA: 01/05/2019 PCP: Shelda Pal, DO   Brief Narrative:  78 year old male with history of hyperlipidemia, Graves' disease status post treatment now hypothyroid, pacemaker, who was admitted to the hospital on 01/11/2019 with acute hypoxic respiratory failure in the setting of COVID-19 viral illness.  He was treated with remdesivir, steroids, Actemra.  He was stable on the floor up until 12/31/2018 when he had an episode of atrial fibrillation with RVR, followed by worsening hypoxia as well as chest pain and required transfer to the ICU.  Assessment & Plan:   Principal Problem:   COVID-19 Active Problems:   Hypothyroidism   Essential hypertension   Cardiac pacemaker in situ   BPH (benign prostatic hyperplasia)   Anxiety   Acute respiratory failure with hypoxia (HCC)   Dyslipidemia   Pneumonia due to COVID-19 virus   Gastroenteritis due to COVID-19 virus   Thrombocytopenia (HCC)   Emphysema of lung (HCC)   Atrial fibrillation with RVR (HCC)   Hyperglycemia  Acute Hypoxic Respiratory Failure due to Covid-19 Viral Illness -Significant increase in O2 needs overnight.  BNP is normal.  Inflammatory labs stable.  Now requiring 45L HFNC and NRB -> currently satting in mid 80's - He's at high risk for intubation, will reconsult PCCM as needed - CXR 12/20 with bilateral opacities with R sided worsening - completed remdesivir - steroids completed 12/21 - received Actemra on 12/9 -OK with mild hypoxemia, goal at rest is > 85% SaO2, with movement ideally > 75% -encouraged the patient to sit up in chair in the daytime use I-S and flutter valve for pulmonary toiletry and then prone in bed when at night. -Maintain negative fluid balance, currently is net -9.9.  Holding diuresis for now. - repeat CXR 12/22 -> bilateral pulm infiltrates  COVID-19 Labs  Recent Labs    01/06/19 0302 01/07/19 0259 01/08/19 0635   DDIMER 0.53* 0.64* 0.49  FERRITIN 586* 554*  --   CRP <0.5 0.6  --     No results found for: SARSCOV2NAA  Tonatiuh Mallon. fib with RVR -Patient had previously Graves' disease, and had an episode of Dorice Stiggers. fib in that setting.  When seen cardiology as an outpatient did not recommend anticoagulation given limited Sharnette Kitamura. fib.  He has had recurrent Lashanna Angelo. fib with RVR during this hospitalization and will start on anticoagulation.  This was discussed with the patient by one of previous hospitalist.  He agrees, he has no bleeding history.  Continue therapeutic Lovenox, monitor H&H but clinically no evidence of bleeding.  Chadsvasc is 3. -His home metoprolol has been resumed -consider transition to doac -currently in sinus rhythm  Essential hypertension -Continue metoprolol  Hypothyroidism, history of Graves' disease -Continue Synthroid  Hyperlipidemia -Continue Zetia  Prediabetes  Steroid and Stress Induced Hyperglycemia:  Continue SSI, basal insulin adjust as needed  Elevated LFT's: likely related to COVID, follow.  Follow acute hepatitis panel (negative).  Polycythemia  Leukocytosis: WBC related to steroids, elevated Hb may be related to diuresis.  Follow.    Difficult lab draw: plan for PICC  DVT prophylaxis: lovenox full dose Code Status: full  Family Communication: none at bedside - wife over phone 12/23 Disposition Plan: pending  Consultants:   none  Procedures:   none  Antimicrobials:  Anti-infectives (From admission, onward)   Start     Dose/Rate Route Frequency Ordered Stop   12/25/18 1000  remdesivir 100 mg in sodium chloride 0.9 % 100  mL IVPB  Status:  Discontinued     100 mg 200 mL/hr over 30 Minutes Intravenous Daily 12/31/2018 1625 01/12/2019 2257   12/25/18 1000  remdesivir 100 mg in sodium chloride 0.9 % 100 mL IVPB     100 mg 200 mL/hr over 30 Minutes Intravenous Daily 01/06/2019 2257 12/29/18 1216   01/07/2019 2330  remdesivir 200 mg in sodium chloride 0.9% 250 mL IVPB     200  mg 580 mL/hr over 30 Minutes Intravenous Once 12/21/2018 2257 12/25/18 0029   01/07/2019 1630  remdesivir 200 mg in sodium chloride 0.9% 250 mL IVPB  Status:  Discontinued     200 mg 580 mL/hr over 30 Minutes Intravenous Once 12/19/2018 1625 12/23/2018 2257     Subjective: Continued SOB No new complaints  Objective: Vitals:   01/08/19 0500 01/08/19 0820 01/08/19 1000 01/08/19 1320  BP: (!) 104/53 (!) 112/54 (!) 118/53 (!) 109/51  Pulse: 87 87 95 95  Resp: (!) 26 (!) 27  (!) 28  Temp:      TempSrc:      SpO2: (!) 86% (!) 87%  (!) 86%  Weight:      Height:        Intake/Output Summary (Last 24 hours) at 01/08/2019 1321 Last data filed at 01/08/2019 0500 Gross per 24 hour  Intake 237 ml  Output 775 ml  Net -538 ml   Filed Weights   01/03/19 0500 01/06/19 0353 01/07/19 0456  Weight: 120.9 kg 121.3 kg 116.6 kg    Examination:  General: No acute distress. Cardiovascular: RRR Lungs: slightly increased WOB on 45 L HFNC.  No adventitious lung sounds. Abdomen: Soft, nontender, nondistended  Neurological: Alert and oriented 3. Moves all extremities 4. Cranial nerves II through XII grossly intact. Skin: Warm and dry. No rashes or lesions. Extremities: No clubbing or cyanosis. No edema.    Data Reviewed: I have personally reviewed following labs and imaging studies  CBC: Recent Labs  Lab 01/04/19 0414 01/05/19 0245 01/06/19 0302 01/07/19 0259 01/08/19 0635  WBC 15.5* 14.3* 14.8* 12.8* 13.3*  NEUTROABS 14.6* 13.2* 13.5* 11.6* 12.4*  HGB 19.0* 18.8* 19.7* 19.2* 19.0*  HCT 56.0* 55.3* 58.2* 56.3* 56.1*  MCV 92.6 91.6 94.5 92.9 93.0  PLT 192 161 176 165 [id number]   Basic Metabolic Panel: Recent Labs  Lab 01/02/19 0500 01/03/19 0245 01/04/19 0414 01/05/19 0245 01/06/19 0302 01/07/19 0259  NA 137 138 137 137 137 136  K 3.9 4.3 4.0 4.0 4.1 3.9  CL 96* 96* 96* 99 96* 96*  CO2 25 28 26 25 24 27   GLUCOSE 220* 198* 243* 206* 145* 124*  BUN 42* 42* 50* 54* 51* 46*   CREATININE 1.22 1.08 1.16 1.12 1.25* 0.98  CALCIUM 8.6* 8.7* 8.7* 8.5* 8.1* 8.1*  MG 2.8* 2.7* 2.8* 2.8* 2.7* 2.8*  PHOS 4.7* 4.5  --   --  4.3  --    GFR: Estimated Creatinine Clearance: 83.3 mL/min (by C-G formula based on SCr of 0.98 mg/dL). Liver Function Tests: Recent Labs  Lab 01/03/19 0245 01/04/19 0414 01/05/19 0245 01/06/19 0302 01/07/19 0259  AST 41 40 38 41 42*  ALT 92* 111* 98* 101* 105*  ALKPHOS 43 46 45 49 47  BILITOT 1.2 1.3* 0.9 1.0 1.4*  PROT 6.2* 6.3* 6.0* 6.0* 5.9*  ALBUMIN 3.4* 3.5 3.1* 3.1* 3.0*   No results for input(s): LIPASE, AMYLASE in the last 168 hours. No results for input(s): AMMONIA in the last 168 hours. Coagulation Profile:  No results for input(s): INR, PROTIME in the last 168 hours. Cardiac Enzymes: No results for input(s): CKTOTAL, CKMB, CKMBINDEX, TROPONINI in the last 168 hours. BNP (last 3 results) No results for input(s): PROBNP in the last 8760 hours. HbA1C: No results for input(s): HGBA1C in the last 72 hours. CBG: Recent Labs  Lab 01/07/19 2001 01/08/19 0048 01/08/19 0541 01/08/19 0738 01/08/19 1203  GLUCAP 224* 183* 232* 186* 205*   Lipid Profile: No results for input(s): CHOL, HDL, LDLCALC, TRIG, CHOLHDL, LDLDIRECT in the last 72 hours. Thyroid Function Tests: No results for input(s): TSH, T4TOTAL, FREET4, T3FREE, THYROIDAB in the last 72 hours. Anemia Panel: Recent Labs    01/06/19 0302 01/07/19 0259  FERRITIN 586* 554*   Sepsis Labs: Recent Labs  Lab 01/06/19 0302 01/07/19 0259  PROCALCITON 0.13 0.12    Recent Results (from the past 240 hour(s))  MRSA PCR Screening     Status: None   Collection Time: 01/01/19  5:13 AM   Specimen: Nasal Mucosa; Nasopharyngeal  Result Value Ref Range Status   MRSA by PCR NEGATIVE NEGATIVE Final    Comment:        The GeneXpert MRSA Assay (FDA approved for NASAL specimens only), is one component of Mateja Dier comprehensive MRSA colonization surveillance program. It is  not intended to diagnose MRSA infection nor to guide or monitor treatment for MRSA infections. Performed at [facility], [place] [address] [place], [place] [address]          Radiology Studies: DG CHEST PORT 1 VIEW  Result Date: 01/07/2019 CLINICAL DATA:  Hypoxia. EXAM: PORTABLE CHEST 1 VIEW COMPARISON:  01/05/2019. FINDINGS: Cardiac pacer stable position. Heart size normal. Diffuse severe bilateral pulmonary infiltrates again noted. No interim change. No pleural effusion or pneumothorax. IMPRESSION: 1.  Cardiac pacer in stable position.  Heart size stable. 2. Diffuse severe bilateral pulmonary infiltrates again noted. No interim change. Electronically Signed   By: [place]   On: 01/07/2019 07:12        Scheduled Meds: . alfuzosin  10 mg Oral QHS  . chlorhexidine  15 mL Mouth Rinse BID  . Chlorhexidine Gluconate Cloth  6 each Topical Daily  . dextromethorphan-guaiFENesin  1 tablet Oral BID  . enoxaparin (LOVENOX) injection  120 mg Subcutaneous Q12H  . ezetimibe  10 mg Oral Daily  . feeding supplement (ENSURE ENLIVE)  237 mL Oral TID BM  . feeding supplement (PRO-STAT SUGAR FREE 64)  30 mL Per Tube TID  . insulin aspart  0-15 Units Subcutaneous Q4H  . insulin detemir  11 Units Subcutaneous BID  . levothyroxine  175 mcg Oral QAC breakfast  . loratadine  10 mg Oral Daily  . mouth rinse  15 mL Mouth Rinse q12n4p  . metoprolol tartrate  25 mg Oral BID  . mirabegron ER  50 mg Oral Daily  . pantoprazole  40 mg Oral Daily  . traZODone  50 mg Oral QHS  . vitamin C  500 mg Oral Daily  . zinc sulfate  220 mg Oral Daily   Continuous Infusions: . sodium chloride 250 mL (12/26/18 0912)  . feeding supplement (OSMOLITE 1.5 CAL)       LOS: 15 days    Time spent: over 30 min 45 min critical care time with AHRF 2/2 COVID pneumonia  Fayrene Helper, MD [facility] Pager AMION  If 7PM-7AM, please contact night-coverage [email or web address] Password  [email or web address] 01/08/2019, 1:21 PM

## 2019-01-08 NOTE — Progress Notes (Signed)
Nutrition Follow-up  RD working remotely.  DOCUMENTATION CODES:   Morbid obesity  INTERVENTION:    Begin TF via Cortrak tube: Osmolite 1.5 at 20 ml/h, increase by 10 ml every 4 hours to goal rate of 70 ml/h with Pro-stat 30 ml TID to provide 2820 kcal, 150 gm protein, 1280 ml free water daily.   Continue regular diet. Encourage PO's.   Continue to offer Ensure Enlive TID, each supplement provides 350 kcal and 20 grams of protein.   Continue Magic cup BID with lunch and dinner, each supplement provides 290 kcal and 9 grams of protein, automatically on meal trays to optimize nutritional intake.   NUTRITION DIAGNOSIS:   Increased nutrient needs related to catabolic [id number] as evidenced by estimated needs.  Ongoing   GOAL:   Patient will meet greater than or equal to 90% of their needs  Unmet  MONITOR:   PO intake, Supplement acceptance, Labs, TF tolerance  ASSESSMENT:   78 year old male with PMHx of HLD, HTN, postablative hypothyroidism, diverticulosis, sleep apnea, A-fib, hx pacemaker placement in 2010 who presented after 3 weeks of epigastric pain and non-bloody diarrhea (4-6 episodes daily) and shortness of breath found to have COVID-19 viral infection.   Patient remains in the ICU. Currently receiving 50 L heated HFNC and NRB. PO intake remains minimal. He is consuming 0% of meals and being offered Ensure Enlive supplements TID between meals; has been drinking 1-2 per day.   Medications reviewed and include Novolog, Levemir, vitamin C, zinc sulfate.  Labs reviewed. CBG's: [id number]  Admission weight 143.8 kg (12/8) Current weight 116.61 kg (12/22) I/O -9.9 L  Diet Order:   Diet Order            Diet regular Room service appropriate? Yes; Fluid consistency: Thin  Diet effective now             EDUCATION NEEDS:   No education needs have been identified at this time  Skin:  Skin Assessment: Reviewed RN Assessment  Last BM:  12/19 type  3  Height:   Ht Readings from Last 1 Encounters:  01/12/2019 5\' 11"  (1.803 m)   Weight:   Wt Readings from Last 1 Encounters:  01/07/19 116.6 kg   Ideal Body Weight:  78.2 kg  BMI:  Body mass index is 35.85 kg/m.  Estimated Nutritional Needs:   Kcal:  [id number]  Protein:  >/= 145 grams  Fluid:  2.3 L/day   Molli Barrows, RD, LDN, [place] Pager [phone] After Hours Pager [phone]

## 2019-01-08 NOTE — Treatment Plan (Signed)
Abnormal CMP, doesn't appear consistent with previous labs. Repeat BMP pending. Overnight RN to follow up with night MD if persistent hypokalemia/hypocalcemia requiring treatment.

## 2019-01-08 NOTE — Progress Notes (Signed)
eLink Physician-Brief Progress Note Patient Name: Arthur Daniel DOB: May 26, 1940 MRN: [id number]   Date of Service  01/08/2019  HPI/Events of Note  Patient with PMH of OSA. Now with COVID pneumonia. RT reports that his sat go for about 83-85 while awake to 75% while asleep.  eICU Interventions  Will try nocturnal BiPAP through the ventilator.     Intervention Category Major Interventions: Hypoxemia - evaluation and management  Samanthajo Payano [place] 01/08/2019, 2:32 AM

## 2019-01-08 NOTE — Progress Notes (Signed)
Patient desating into low 70s. Patient currently on NRB 15L AND HHFNC 50L/100%. Patient stated he wears CPAP at home for sleep apnea. RT called DR. SOMMER and got order for BIPAP at night. RT attempted to place BIPAP on patient. Patient sats increased to 89 90 with decrease in WOB. Patient was only able to leave mask on for 14min. Patient became very anxious and wanted it off. RT placed patient back on [place]. RT will continue to monitor.

## 2019-01-09 ENCOUNTER — Inpatient Hospital Stay: Payer: Self-pay

## 2019-01-09 ENCOUNTER — Inpatient Hospital Stay (HOSPITAL_COMMUNITY): Payer: Medicare HMO

## 2019-01-09 DIAGNOSIS — J80 Acute respiratory distress syndrome: Secondary | ICD-10-CM

## 2019-01-09 LAB — FERRITIN: Ferritin: 347 ng/mL — ABNORMAL HIGH (ref 24–336)

## 2019-01-09 LAB — POCT I-STAT 7, (LYTES, BLD GAS, ICA,H+H)
Bicarbonate: 26.7 mmol/L (ref 20.0–28.0)
Calcium, Ion: 1.25 mmol/L (ref 1.15–1.40)
HCT: 47 % (ref 39.0–52.0)
Hemoglobin: 16 g/dL (ref 13.0–17.0)
O2 Saturation: 85 %
Potassium: 4 mmol/L (ref 3.5–5.1)
Sodium: 138 mmol/L (ref 135–145)
TCO2: 28 mmol/L (ref 22–32)
pCO2 arterial: 49.4 mmHg — ABNORMAL HIGH (ref 32.0–48.0)
pH, Arterial: 7.341 — ABNORMAL LOW (ref 7.350–7.450)
pO2, Arterial: 53 mmHg — ABNORMAL LOW (ref 83.0–108.0)

## 2019-01-09 LAB — COMPREHENSIVE METABOLIC PANEL
ALT: 88 U/L — ABNORMAL HIGH (ref 0–44)
AST: 43 U/L — ABNORMAL HIGH (ref 15–41)
Albumin: 2.8 g/dL — ABNORMAL LOW (ref 3.5–5.0)
Alkaline Phosphatase: 72 U/L (ref 38–126)
Anion gap: 13 (ref 5–15)
BUN: 65 mg/dL — ABNORMAL HIGH (ref 8–23)
CO2: 30 mmol/L (ref 22–32)
Calcium: 9.5 mg/dL (ref 8.9–10.3)
Chloride: 95 mmol/L — ABNORMAL LOW (ref 98–111)
Creatinine, Ser: 1.19 mg/dL (ref 0.61–1.24)
GFR calc Af Amer: >60 mL/min (ref >60)
GFR calc non Af Amer: 59 mL/min — ABNORMAL LOW (ref >60)
Glucose, Bld: 150 mg/dL — ABNORMAL HIGH (ref 70–99)
Potassium: 4.7 mmol/L (ref 3.5–5.1)
Sodium: 138 mmol/L (ref 135–145)
Total Bilirubin: 0.7 mg/dL (ref 0.3–1.2)
Total Protein: 5.9 g/dL — ABNORMAL LOW (ref 6.5–8.1)

## 2019-01-09 LAB — CBC WITH DIFFERENTIAL/PLATELET
Abs Immature Granulocytes: 0.18 10*3/uL — ABNORMAL HIGH (ref 0.00–0.07)
Basophils Absolute: 0 10*3/uL (ref 0.0–0.1)
Basophils Relative: 0 %
Eosinophils Absolute: 0.1 10*3/uL (ref 0.0–0.5)
Eosinophils Relative: 1 %
HCT: 45.1 % (ref 39.0–52.0)
Hemoglobin: 15 g/dL (ref 13.0–17.0)
Immature Granulocytes: 2 %
Lymphocytes Relative: 5 %
Lymphs Abs: 0.5 10*3/uL — ABNORMAL LOW (ref 0.7–4.0)
MCH: 31.3 pg (ref 26.0–34.0)
MCHC: 33.3 g/dL (ref 30.0–36.0)
MCV: 94 fL (ref 80.0–100.0)
Monocytes Absolute: 0.3 10*3/uL (ref 0.1–1.0)
Monocytes Relative: 3 %
Neutro Abs: 10.3 10*3/uL — ABNORMAL HIGH (ref 1.7–7.7)
Neutrophils Relative %: 89 %
Platelets: 103 10*3/uL — ABNORMAL LOW (ref 150–400)
RBC: 4.8 MIL/uL (ref 4.22–5.81)
RDW: 13.2 % (ref 11.5–15.5)
WBC: 11.5 10*3/uL — ABNORMAL HIGH (ref 4.0–10.5)
nRBC: 0.3 % — ABNORMAL HIGH (ref 0.0–0.2)

## 2019-01-09 LAB — MAGNESIUM
Magnesium: 3.1 mg/dL — ABNORMAL HIGH (ref 1.7–2.4)
Magnesium: 2.6 mg/dL — ABNORMAL HIGH (ref 1.7–2.4)

## 2019-01-09 LAB — C-REACTIVE PROTEIN: CRP: 4.2 mg/dL — ABNORMAL HIGH (ref <1.0)

## 2019-01-09 LAB — PHOSPHORUS
Phosphorus: 5.2 mg/dL — ABNORMAL HIGH (ref 2.5–4.6)
Phosphorus: 7 mg/dL — ABNORMAL HIGH (ref 2.5–4.6)

## 2019-01-09 LAB — GLUCOSE, CAPILLARY
Glucose-Capillary: 206 mg/dL — ABNORMAL HIGH (ref 70–99)
Glucose-Capillary: 143 mg/dL — ABNORMAL HIGH (ref 70–99)
Glucose-Capillary: 168 mg/dL — ABNORMAL HIGH (ref 70–99)
Glucose-Capillary: 178 mg/dL — ABNORMAL HIGH (ref 70–99)

## 2019-01-09 LAB — D-DIMER, QUANTITATIVE: D-Dimer, Quant: 0.32 ug/mL-FEU (ref 0.00–0.50)

## 2019-01-09 LAB — LACTIC ACID, PLASMA: Lactic Acid, Venous: 2.7 mmol/L (ref 0.5–1.9)

## 2019-01-09 MED ORDER — ETOMIDATE 2 MG/ML IV SOLN: 20.0000 mg | Freq: Once | INTRAVENOUS | Status: AC

## 2019-01-09 MED ORDER — FENTANYL CITRATE (PF) 100 MCG/2ML IJ SOLN: 25.0000 ug | Freq: Once | INTRAMUSCULAR | Status: DC

## 2019-01-09 MED ORDER — SODIUM CHLORIDE 0.9 % IV BOLUS
500.0000 mL | Freq: Once | INTRAVENOUS | Status: AC
  Administered 2019-01-09: 500 mL via INTRAVENOUS

## 2019-01-09 MED ORDER — NOREPINEPHRINE 4 MG/250ML-% IV SOLN
0.0000 ug/min | INTRAVENOUS | Status: DC
  Administered 2019-01-09: 21:00:00 25 ug/min via INTRAVENOUS
  Administered 2019-01-09: 17:00:00 20 ug/min via INTRAVENOUS
  Administered 2019-01-09: 2 ug/min via INTRAVENOUS
  Administered 2019-01-09: 21 ug/min via INTRAVENOUS
  Administered 2019-01-10: 25 ug/min via INTRAVENOUS
  Administered 2019-01-10: 03:00:00 24 ug/min via INTRAVENOUS
  Filled 2019-01-09: qty 250
  Filled 2019-01-09: qty 500
  Filled 2019-01-09: qty 250
  Filled 2019-01-09: qty 500

## 2019-01-09 MED ORDER — PRO-STAT SUGAR FREE PO LIQD
30.0000 mL | Freq: Two times a day (BID) | ORAL | Status: DC
  Administered 2019-01-09: 12:00:00 30 mL
  Filled 2019-01-09: qty 30

## 2019-01-09 MED ORDER — DEXTROMETHORPHAN POLISTIREX ER 30 MG/5ML PO SUER
30.0000 mg | Freq: Two times a day (BID) | ORAL | Status: DC
  Administered 2019-01-10 – 2019-01-13 (×7): 30 mg
  Filled 2019-01-09 (×9): qty 5

## 2019-01-09 MED ORDER — VANCOMYCIN HCL 1750 MG/350ML IV SOLN
1750.0000 mg | INTRAVENOUS | Status: DC
  Administered 2019-01-09: 1750 mg via INTRAVENOUS
  Filled 2019-01-09 (×2): qty 350

## 2019-01-09 MED ORDER — LORATADINE 10 MG PO TABS
10.0000 mg | ORAL_TABLET | Freq: Every day | ORAL | Status: DC
  Administered 2019-01-10 – 2019-01-13 (×4): 10 mg
  Filled 2019-01-09 (×4): qty 1

## 2019-01-09 MED ORDER — FENTANYL CITRATE (PF) 100 MCG/2ML IJ SOLN
50.0000 ug | Freq: Once | INTRAMUSCULAR | Status: AC
  Administered 2019-01-09: 11:00:00 50 ug via INTRAVENOUS

## 2019-01-09 MED ORDER — MIDAZOLAM HCL 2 MG/2ML IJ SOLN
INTRAMUSCULAR | Status: AC
  Administered 2019-01-09: 11:00:00 2 mg via INTRAVENOUS
  Filled 2019-01-09: qty 4

## 2019-01-09 MED ORDER — CLONAZEPAM 0.1 MG/ML ORAL SUSPENSION
2.0000 mg | Freq: Two times a day (BID) | ORAL | Status: DC
  Filled 2019-01-09 (×2): qty 20

## 2019-01-09 MED ORDER — VITAL HIGH PROTEIN PO LIQD: 1000.0000 mL | ORAL | Status: DC

## 2019-01-09 MED ORDER — STERILE WATER FOR INJECTION IJ SOLN
INTRAMUSCULAR | Status: AC
  Filled 2019-01-09: qty 10

## 2019-01-09 MED ORDER — TRAZODONE HCL 50 MG PO TABS
50.0000 mg | ORAL_TABLET | Freq: Every day | ORAL | Status: DC
  Administered 2019-01-09 – 2019-01-12 (×3): 50 mg
  Filled 2019-01-09 (×3): qty 1

## 2019-01-09 MED ORDER — FENTANYL CITRATE (PF) 100 MCG/2ML IJ SOLN
100.0000 ug | Freq: Once | INTRAMUSCULAR | Status: AC
  Administered 2019-01-09: 11:00:00 50 ug via INTRAVENOUS

## 2019-01-09 MED ORDER — ORAL CARE MOUTH RINSE
15.0000 mL | OROMUCOSAL | Status: DC
  Administered 2019-01-09 – 2019-01-13 (×41): 15 mL via OROMUCOSAL

## 2019-01-09 MED ORDER — OXYCODONE HCL 5 MG/5ML PO SOLN
5.0000 mg | Freq: Four times a day (QID) | ORAL | Status: DC
  Administered 2019-01-09 – 2019-01-13 (×13): 5 mg via ORAL
  Filled 2019-01-09 (×14): qty 5

## 2019-01-09 MED ORDER — ETOMIDATE 2 MG/ML IV SOLN
INTRAVENOUS | Status: AC
  Administered 2019-01-09: 11:00:00 20 mg via INTRAVENOUS
  Filled 2019-01-09: qty 20

## 2019-01-09 MED ORDER — LACTATED RINGERS IV BOLUS
1000.0000 mL | Freq: Once | INTRAVENOUS | Status: AC
  Administered 2019-01-09: 1000 mL via INTRAVENOUS

## 2019-01-09 MED ORDER — FENTANYL BOLUS VIA INFUSION
25.0000 ug | INTRAVENOUS | Status: DC | PRN
  Filled 2019-01-09: qty 25

## 2019-01-09 MED ORDER — MIDAZOLAM HCL 2 MG/2ML IJ SOLN
2.0000 mg | Freq: Once | INTRAMUSCULAR | Status: AC
  Administered 2019-01-09: 12:00:00 2 mg via INTRAVENOUS

## 2019-01-09 MED ORDER — MIDAZOLAM 50MG/50ML (1MG/ML) PREMIX INFUSION
1.0000 mg/h | INTRAVENOUS | Status: DC
  Administered 2019-01-09: 19:00:00 10 mg/h via INTRAVENOUS
  Administered 2019-01-09: 7 mg/h via INTRAVENOUS
  Administered 2019-01-10 – 2019-01-11 (×5): 10 mg/h via INTRAVENOUS
  Administered 2019-01-12: 16:00:00 6 mg/h via INTRAVENOUS
  Administered 2019-01-12: 10 mg/h via INTRAVENOUS
  Filled 2019-01-09 (×8): qty 50

## 2019-01-09 MED ORDER — VECURONIUM BROMIDE 10 MG IV SOLR
INTRAVENOUS | Status: AC
  Filled 2019-01-09: qty 10

## 2019-01-09 MED ORDER — VITAL 1.5 CAL PO LIQD
1000.0000 mL | ORAL | Status: DC
  Administered 2019-01-09 – 2019-01-13 (×3): 1000 mL

## 2019-01-09 MED ORDER — LEVOTHYROXINE SODIUM 175 MCG PO TABS
175.0000 ug | ORAL_TABLET | Freq: Every day | ORAL | Status: DC
  Administered 2019-01-10 – 2019-01-13 (×4): 175 ug
  Filled 2019-01-09 (×5): qty 1

## 2019-01-09 MED ORDER — FENTANYL 2500MCG IN NS 250ML (10MCG/ML) PREMIX INFUSION
25.0000 ug/h | INTRAVENOUS | Status: DC
  Administered 2019-01-09: 23:00:00 200 ug/h via INTRAVENOUS
  Administered 2019-01-09: 25 ug/h via INTRAVENOUS
  Administered 2019-01-10: 12:00:00 200 ug/h via INTRAVENOUS
  Filled 2019-01-09 (×3): qty 250

## 2019-01-09 MED ORDER — MIDAZOLAM HCL 2 MG/2ML IJ SOLN: 1.0000 mg | INTRAMUSCULAR | Status: DC | PRN

## 2019-01-09 MED ORDER — PRO-STAT SUGAR FREE PO LIQD
30.0000 mL | Freq: Three times a day (TID) | ORAL | Status: DC
  Administered 2019-01-09 – 2019-01-13 (×12): 30 mL
  Filled 2019-01-09 (×12): qty 30

## 2019-01-09 MED ORDER — PROPOFOL 10 MG/ML IV BOLUS
INTRAVENOUS | Status: AC
  Filled 2019-01-09: qty 20

## 2019-01-09 MED ORDER — PANTOPRAZOLE SODIUM 40 MG PO PACK
40.0000 mg | PACK | Freq: Every day | ORAL | Status: DC
  Administered 2019-01-10 – 2019-01-13 (×4): 40 mg
  Filled 2019-01-09 (×4): qty 20

## 2019-01-09 MED ORDER — SODIUM CHLORIDE 0.9 % IV SOLN
2.0000 g | Freq: Three times a day (TID) | INTRAVENOUS | Status: DC
  Administered 2019-01-09 – 2019-01-13 (×11): 2 g via INTRAVENOUS
  Filled 2019-01-09 (×11): qty 2

## 2019-01-09 MED ORDER — GUAIFENESIN 100 MG/5ML PO SOLN
15.0000 mL | Freq: Four times a day (QID) | ORAL | Status: DC
  Administered 2019-01-09 – 2019-01-13 (×16): 300 mg
  Filled 2019-01-09 (×15): qty 15

## 2019-01-09 MED ORDER — ENOXAPARIN SODIUM 120 MG/0.8ML ~~LOC~~ SOLN
115.0000 mg | Freq: Two times a day (BID) | SUBCUTANEOUS | Status: DC
  Administered 2019-01-09 – 2019-01-10 (×2): 115 mg via SUBCUTANEOUS
  Filled 2019-01-09 (×3): qty 0.8

## 2019-01-09 MED ORDER — METOPROLOL TARTRATE 25 MG PO TABS
25.0000 mg | ORAL_TABLET | Freq: Two times a day (BID) | ORAL | Status: DC
  Administered 2019-01-09: 21:00:00 25 mg
  Filled 2019-01-09: qty 1

## 2019-01-09 MED ORDER — ASCORBIC ACID 500 MG PO TABS
500.0000 mg | ORAL_TABLET | Freq: Every day | ORAL | Status: DC
  Administered 2019-01-10 – 2019-01-13 (×4): 500 mg
  Filled 2019-01-09 (×4): qty 1

## 2019-01-09 MED ORDER — CHLORHEXIDINE GLUCONATE 0.12% ORAL RINSE (MEDLINE KIT): 15.0000 mL | Freq: Two times a day (BID) | OROMUCOSAL | Status: DC

## 2019-01-09 MED ORDER — PROPOFOL 1000 MG/100ML IV EMUL
0.0000 ug/kg/min | INTRAVENOUS | Status: DC
  Administered 2019-01-09: 50 ug/kg/min via INTRAVENOUS
  Administered 2019-01-09: 15 ug/kg/min via INTRAVENOUS
  Filled 2019-01-09 (×2): qty 100

## 2019-01-09 MED ORDER — VASOPRESSIN 20 UNIT/ML IV SOLN
0.0300 [IU]/min | INTRAVENOUS | Status: DC
  Administered 2019-01-09 – 2019-01-13 (×5): 0.03 [IU]/min via INTRAVENOUS
  Filled 2019-01-09 (×8): qty 2

## 2019-01-09 MED ORDER — EZETIMIBE 10 MG PO TABS
10.0000 mg | ORAL_TABLET | Freq: Every day | ORAL | Status: DC
  Administered 2019-01-10 – 2019-01-13 (×4): 10 mg
  Filled 2019-01-09 (×4): qty 1

## 2019-01-09 MED ORDER — CLONAZEPAM 1 MG PO TABS
2.0000 mg | ORAL_TABLET | Freq: Two times a day (BID) | ORAL | Status: DC
  Administered 2019-01-09 – 2019-01-13 (×7): 2 mg
  Filled 2019-01-09: qty 2
  Filled 2019-01-09: qty 4
  Filled 2019-01-09 (×5): qty 2

## 2019-01-09 MED ORDER — BISACODYL 10 MG RE SUPP
10.0000 mg | Freq: Every day | RECTAL | Status: DC | PRN
  Filled 2019-01-09: qty 1

## 2019-01-09 MED ORDER — ROCURONIUM BROMIDE 10 MG/ML (PF) SYRINGE
PREFILLED_SYRINGE | INTRAVENOUS | Status: AC
  Administered 2019-01-09: 11:00:00 100 mg
  Filled 2019-01-09: qty 10

## 2019-01-09 MED ORDER — MIDAZOLAM HCL 2 MG/2ML IJ SOLN
2.0000 mg | Freq: Once | INTRAMUSCULAR | Status: DC
  Filled 2019-01-09: qty 2

## 2019-01-09 MED ORDER — SUCCINYLCHOLINE CHLORIDE 200 MG/10ML IV SOSY
PREFILLED_SYRINGE | INTRAVENOUS | Status: AC
  Filled 2019-01-09: qty 10

## 2019-01-09 MED ORDER — ZINC SULFATE 220 (50 ZN) MG PO CAPS
220.0000 mg | ORAL_CAPSULE | Freq: Every day | ORAL | Status: DC
  Administered 2019-01-10 – 2019-01-13 (×4): 220 mg
  Filled 2019-01-09 (×4): qty 1

## 2019-01-09 MED ORDER — SENNOSIDES 8.8 MG/5ML PO SYRP: 5.0000 mL | ORAL_SOLUTION | Freq: Two times a day (BID) | ORAL | Status: DC | PRN

## 2019-01-09 MED ORDER — FENTANYL CITRATE (PF) 100 MCG/2ML IJ SOLN
INTRAMUSCULAR | Status: AC
  Administered 2019-01-09: 12:00:00 25 ug via INTRAVENOUS
  Filled 2019-01-09: qty 2

## 2019-01-09 NOTE — Procedures (Signed)
Central Venous Catheter Insertion Procedure Note Arthur Daniel [id number] [birth year]  Procedure: Insertion of Central Venous Catheter Indications: Assessment of intravascular volume, hypotension post intuabtion  Procedure Details Consent: Unable to obtain consent because of emergent medical necessity. Time Out: Verified patient identification, verified procedure, site/side was marked, verified correct patient position, special equipment/implants available, medications/allergies/relevent history reviewed, required imaging and test results available.  Performed  Maximum sterile technique was used including antiseptics, cap, gloves, gown, hand hygiene, mask and sheet. Skin prep: Chlorhexidine; local anesthetic administered A antimicrobial bonded/coated triple lumen catheter was placed in the right subclavian vein using the Seldinger technique.  Ultrasound was used to verify the patency of the vein and for real time needle guidance.  Evaluation Blood flow good Complications: No apparent complications Patient did tolerate procedure well. Chest X-ray ordered to verify placement.  CXR: pending.  Simonne Maffucci 01/09/2019, 11:17 AM

## 2019-01-09 NOTE — Progress Notes (Signed)
eLink Physician-Brief Progress Note Patient Name: Arthur Daniel DOB: Jun 11, 1940 MRN: [id number]   Date of Service  01/09/2019  HPI/Events of Note  Patient desaturated to 86%. PEEP increased from 10 to 18 over a period of time with now significant improvement in SpO2 to 95%.  eICU Interventions  No further action required at this time.     Intervention Category Major Interventions: Hypoxemia - evaluation and management  Charlott Rakes 01/09/2019, 9:26 PM

## 2019-01-09 NOTE — Progress Notes (Addendum)
Nutrition Follow-up  RD working remotely.  DOCUMENTATION CODES:   Obesity unspecified  INTERVENTION:   Continue TF via Cortrak tube:    Change to Vital 1.5 at 70 ml/h (1680 ml per day)  Pro-stat 30 ml TID  Provides 2820 kcal, 158 gm protein, 1284 ml free water daily.  NUTRITION DIAGNOSIS:   Increased nutrient needs related to catabolic [id number]) as evidenced by estimated needs.  Ongoing   GOAL:   Patient will meet greater than or equal to 90% of their needs  Being addressed with TF  MONITOR:   Vent status, TF tolerance, Labs, I & O's  ASSESSMENT:   78 year old male with PMHx of HLD, HTN, postablative hypothyroidism, diverticulosis, sleep apnea, A-fib, hx pacemaker placement in 2010 who presented after 3 weeks of epigastric pain and non-bloody diarrhea (4-6 episodes daily) and shortness of breath found to have COVID-19 viral infection.   Patient remains in the ICU. Decision was made to intubate patient this morning.  Received MD Consult for TF initiation and management.  Cortrak was placed yesterday. Osmolite 1.5 tube feeding was ordered yesterday.   Patient is currently intubated on ventilator support Temp (24hrs), Avg:97.7 F (36.5 C), Min:97.6 F (36.4 C), Max:97.7 F (36.5 C)   Medications reviewed and include Novolog, Levemir, vitamin C, zinc sulfate.  Labs reviewed. Phos 5.2 (H), mag 3.1 (H), BUN 65 (H) CBG's: 206-143  Admission weight 143.8 kg (12/8) Current weight 116.6 kg (12/24) I/O -10.7 L  Diet Order:   Diet Order            Diet regular Room service appropriate? Yes; Fluid consistency: Thin  Diet effective now             EDUCATION NEEDS:   No education needs have been identified at this time  Skin:  Skin Assessment: Reviewed RN Assessment  Last BM:  12/19 type 3  Height:   Ht Readings from Last 1 Encounters:  12/19/2018 5\' 11"  (1.803 m)   Weight:   Wt Readings from Last 1 Encounters:  01/09/19 116.6 kg   Ideal  Body Weight:  78.2 kg  BMI:  Body mass index is 35.85 kg/m.  Estimated Nutritional Needs:   Kcal:  [id number]  Protein:  >/= 156 gm  Fluid:  2.3 L/day   Molli Barrows, RD, LDN, [place] Pager [phone] After Hours Pager [phone]

## 2019-01-09 NOTE — Progress Notes (Signed)
[facility] P[facility]M  Worsening shock since intubation Vent dyssynchrony  Stop propofol Start versed infusion Give 1L Bolus LR Check CXR (pneumothorax?) Check CVP Add vasopressin  Roselie Awkward, MD [place] [facility] Pager: [phone] Cell: [phone] If no response, call [phone]

## 2019-01-09 NOTE — Procedures (Signed)
Intubation Procedure Note Arthur Daniel [id number] [birth year]  Procedure: Intubation Indications: Respiratory insufficiency  Procedure Details Consent: Risks of procedure as well as the alternatives and risks of each were explained to the (patient/caregiver).  Consent for procedure obtained. Time Out: Verified patient identification, verified procedure, site/side was marked, verified correct patient position, special equipment/implants available, medications/allergies/relevent history reviewed, required imaging and test results available.  Performed  Drugs Versed 41m IV, Fentanyl 557m IV, etomidate 2010mV, rocuronium 100m16m DL x 1 with MAC 4 blade Grade 1 view 7.5 ET tube passed through cords under direct visualization Placement confirmed with bilateral breath sounds, positive EtCO2 change and smoke in tube   Evaluation Hemodynamic Status: BP stable throughout; O2 sats: transiently fell during during procedure Patient's Current Condition: stable Complications: No apparent complications Patient did tolerate procedure well. Chest X-ray ordered to verify placement.  CXR: pending.   DougSimonne Maffucci24/2020

## 2019-01-09 NOTE — Progress Notes (Signed)
PROGRESS NOTE    Arthur Daniel  [id number] DOB: [birth year] DOA: 01/04/2019 PCP: Shelda Pal, DO   Brief Narrative:  78 year old male with history of hyperlipidemia, Graves' disease status post treatment now hypothyroid, pacemaker, who was admitted to the hospital on 12/26/2018 with acute hypoxic respiratory failure in the setting of COVID-19 viral illness.  He was treated with remdesivir, steroids, Actemra.  He was stable on the floor up until 12/31/2018 when he had an episode of atrial fibrillation with RVR, followed by worsening hypoxia as well as chest pain and required transfer to the ICU.  Assessment & Plan:   Principal Problem:   COVID-19 Active Problems:   Hypothyroidism   Essential hypertension   Cardiac pacemaker in situ   BPH (benign prostatic hyperplasia)   Anxiety   Acute respiratory failure with hypoxia (HCC)   Dyslipidemia   Pneumonia due to COVID-19 virus   Gastroenteritis due to COVID-19 virus   Thrombocytopenia (HCC)   Emphysema of lung (HCC)   Atrial fibrillation with RVR (HCC)   Hyperglycemia  Acute Hypoxic Respiratory Failure due to Covid-19 Viral Illness - On bipap overnight due to worsening sats - seen by PCCM who has now intubated Arthur Daniel - CXR 12/20 with bilateral opacities with R sided worsening - CXR 12/24 with stable diffuse bilateral interstitial pneumonitis, bilateral lower lung zone pneumonia - completed remdesivir - steroids completed 12/21 - received Actemra on 12/9 -Maintain negative fluid balance, currently is net -10.8.  Holding diuresis for now. - repeat CXR 12/22 -> bilateral pulm infiltrates  COVID-19 Labs  Recent Labs    01/07/19 0259 01/08/19 0635 01/08/19 1437 01/08/19 1438 01/09/19 0445  DDIMER 0.64* 0.49  --  0.62* 0.32  FERRITIN 554*  --  407*  --  347*  CRP 0.6  --  5.3*  --  4.2*    No results found for: SARSCOV2NAA  Arthur Daniel. fib with RVR -Patient had previously Graves' disease, and had an episode of  Arthur Daniel. fib in that setting.  When seen cardiology as an outpatient did not recommend anticoagulation given limited Arthur Daniel. fib.  He has had recurrent Arthur Daniel. fib with RVR during this hospitalization and will start on anticoagulation.  This was discussed with the patient by one of previous hospitalist.  He agrees, he has no bleeding history.  Continue therapeutic Lovenox, monitor H&H but clinically no evidence of bleeding.  Chadsvasc is 3. -His home metoprolol has been resumed -consider transition to doac -currently in sinus rhythm  Essential hypertension -Continue metoprolol  Hypothyroidism, history of Graves' disease -Continue Synthroid  Hyperlipidemia -Continue Zetia  Prediabetes  Steroid and Stress Induced Hyperglycemia:  Continue SSI, basal insulin adjust as needed  Elevated LFT's: likely related to COVID, follow.  Follow acute hepatitis panel (negative).  Thrombocytopenia: follow in setting of acute illness  Polycythemia  Leukocytosis: WBC related to steroids, elevated Hb may be related to diuresis.  Follow.    DVT prophylaxis: lovenox full dose Code Status: full  Family Communication: none at bedside - wife over phone 12/23 Disposition Plan: pending  Consultants:   none  Procedures:   none  Antimicrobials:  Anti-infectives (From admission, onward)   Start     Dose/Rate Route Frequency Ordered Stop   12/25/18 1000  remdesivir 100 mg in sodium chloride 0.9 % 100 mL IVPB  Status:  Discontinued     100 mg 200 mL/hr over 30 Minutes Intravenous Daily 01/12/2019 1625 12/20/2018 2257   12/25/18 1000  remdesivir 100 mg in sodium  chloride 0.9 % 100 mL IVPB     100 mg 200 mL/hr over 30 Minutes Intravenous Daily 01/02/2019 2257 12/29/18 1216   12/29/2018 2330  remdesivir 200 mg in sodium chloride 0.9% 250 mL IVPB     200 mg 580 mL/hr over 30 Minutes Intravenous Once 12/20/2018 2257 12/25/18 0029   12/18/2018 1630  remdesivir 200 mg in sodium chloride 0.9% 250 mL IVPB  Status:  Discontinued      200 mg 580 mL/hr over 30 Minutes Intravenous Once 01/12/2019 1625 12/31/2018 2257     Subjective: He notes that he'd like to be intubated if that's what is needed C/o SOB  Objective: Vitals:   01/09/19 0630 01/09/19 0645 01/09/19 0700 01/09/19 0750  BP: 99/62  (!) 97/45 (!) 97/53  Pulse: 90 85 85 81  Resp: (!) 27 (!) 21 (!) 23 (!) 22  Temp:      TempSrc:      SpO2: 91% (!) 88% 90% 93%  Weight:      Height:        Intake/Output Summary (Last 24 hours) at 01/09/2019 1158 Last data filed at 01/09/2019 0600 Gross per 24 hour  Intake 474 ml  Output 1250 ml  Net -776 ml   Filed Weights   01/06/19 0353 01/07/19 0456 01/09/19 0500  Weight: 121.3 kg 116.6 kg 116.6 kg    Examination:  General: No acute distress. Cardiovascular: RRR Lungs: labored on bipap Abdomen: Soft, nontender, nondistended. Neurological: Alert and oriented 3. Moves all extremities 4. Cranial nerves II through XII grossly intact. Skin: Warm and dry. No rashes or lesions. Extremities: No clubbing or cyanosis. No edema.  Data Reviewed: I have personally reviewed following labs and imaging studies  CBC: Recent Labs  Lab 01/05/19 0245 01/06/19 0302 01/07/19 0259 01/08/19 0635 01/09/19 0445  WBC 14.3* 14.8* 12.8* 13.3* 11.5*  NEUTROABS 13.2* 13.5* 11.6* 12.4* 10.3*  HGB 18.8* 19.7* 19.2* 19.0* 15.0  HCT 55.3* 58.2* 56.3* 56.1* 45.1  MCV 91.6 94.5 92.9 93.0 94.0  PLT 161 176 165 171 [id number]*   Basic Metabolic Panel: Recent Labs  Lab 01/03/19 0245 01/05/19 0245 01/06/19 0302 01/07/19 0259 01/08/19 1438 01/08/19 1900 01/09/19 0445  NA 138 137 137 136 142 139 138  K 4.3 4.0 4.1 3.9 2.7* 4.0 4.7  CL 96* 99 96* 96* 113* 97* 95*  CO2 28 25 24 27 22 29 30   GLUCOSE 198* 206* 145* 124* 171* 210* 150*  BUN 42* 54* 51* 46* 52* 68* 65*  CREATININE 1.08 1.12 1.25* 0.98 0.97 1.29* 1.19  CALCIUM 8.7* 8.5* 8.1* 8.1* 5.8* 8.8* 9.5  MG 2.7* 2.8* 2.7* 2.8* 2.1  --  3.1*  PHOS 4.5  --  4.3  --  3.6  --  5.2*    GFR: Estimated Creatinine Clearance: 68.6 mL/min (by C-G formula based on SCr of 1.19 mg/dL). Liver Function Tests: Recent Labs  Lab 01/05/19 0245 01/06/19 0302 01/07/19 0259 01/08/19 1438 01/09/19 0445  AST 38 41 42* 24 43*  ALT 98* 101* 105* 54* 88*  ALKPHOS 45 49 47 33* 72  BILITOT 0.9 1.0 1.4* 0.6 0.7  PROT 6.0* 6.0* 5.9* 3.7* 5.9*  ALBUMIN 3.1* 3.1* 3.0* 1.8* 2.8*   No results for input(s): LIPASE, AMYLASE in the last 168 hours. No results for input(s): AMMONIA in the last 168 hours. Coagulation Profile: No results for input(s): INR, PROTIME in the last 168 hours. Cardiac Enzymes: No results for input(s): CKTOTAL, CKMB, CKMBINDEX, TROPONINI in the last  168 hours. BNP (last 3 results) No results for input(s): PROBNP in the last 8760 hours. HbA1C: No results for input(s): HGBA1C in the last 72 hours. CBG: Recent Labs  Lab 01/08/19 1949 01/08/19 2322 01/09/19 0327 01/09/19 0752 01/09/19 1140  GLUCAP 236* 148* 206* 143* 168*   Lipid Profile: No results for input(s): CHOL, HDL, LDLCALC, TRIG, CHOLHDL, LDLDIRECT in the last 72 hours. Thyroid Function Tests: No results for input(s): TSH, T4TOTAL, FREET4, T3FREE, THYROIDAB in the last 72 hours. Anemia Panel: Recent Labs    01/08/19 1437 01/09/19 0445  FERRITIN 407* 347*   Sepsis Labs: Recent Labs  Lab 01/06/19 0302 01/07/19 0259  PROCALCITON 0.13 0.12    Recent Results (from the past 240 hour(s))  MRSA PCR Screening     Status: None   Collection Time: 01/01/19  5:13 AM   Specimen: Nasal Mucosa; Nasopharyngeal  Result Value Ref Range Status   MRSA by PCR NEGATIVE NEGATIVE Final    Comment:        The GeneXpert MRSA Assay (FDA approved for NASAL specimens only), is one component of [facility] comprehensive MRSA colonization surveillance program. It is not intended to diagnose MRSA infection nor to guide or monitor treatment for MRSA infections. Performed at [facility], [place] [address] [place], [place] [address]          Radiology Studies: DG CHEST PORT 1 VIEW  Result Date: 01/09/2019 CLINICAL DATA:  Hypoxia. Follow-up lung densities. Intubated. EXAM: PORTABLE CHEST 1 VIEW COMPARISON:  Earlier today. FINDINGS: Interval endotracheal tube in satisfactory position. Feeding tube extending into the stomach. Stable left subclavian bipolar pacemaker and leads. No significant change in diffuse prominence of the interstitial markings in both lungs and patchy airspace opacity in the left lower lung zone and mild patchy opacity at the right lung base. No visible pleural fluid. Unremarkable bones. IMPRESSION: 1. Interval endotracheal tube in satisfactory position. 2. Stable probable bilateral lower lung zone pneumonia, remaining greater on the left. 3. Stable diffuse bilateral interstitial pneumonitis. Electronically Signed   By: Claudie Revering M.D.   On: 01/09/2019 11:53   DG CHEST PORT 1 VIEW  Result Date: 01/09/2019 CLINICAL DATA:  Hypoxia. EXAM: PORTABLE CHEST 1 VIEW COMPARISON:  01/07/2019 FINDINGS: Left-sided pacemaker unchanged. Enteric tube courses into the region of the stomach as tip is not visualized. Lordotic technique is demonstrated. Lungs are somewhat hypoinflated with persistent stable moderate mixed interstitial airspace density throughout both lungs. There is interval worsening of consolidation over the left mid to lower lung. Remainder the exam is unchanged. IMPRESSION: 1. Interval worsening consolidation over the left mid to lower lung which may be due to infection versus effusion with atelectasis. 2. Stable diffuse bilateral mixed interstitial airspace density which may be due to edema or infection. 3. Enteric tube coursing into the region of the stomach with tip not visualized. Electronically Signed   By: Marin Olp M.D.   On: 01/09/2019 08:02   [place] EKG SITE RITE  Result Date: 01/09/2019 If Site Rite image not attached, placement could not be confirmed due to  current cardiac rhythm.  [place] EKG SITE RITE  Result Date: 01/08/2019 If Site Rite image not attached, placement could not be confirmed due to current cardiac rhythm.       Scheduled Meds: . alfuzosin  10 mg Oral QHS  . chlorhexidine  15 mL Mouth Rinse BID  . Chlorhexidine Gluconate Cloth  6 each Topical Daily  . dextromethorphan-guaiFENesin  1 tablet Oral BID  .  enoxaparin (LOVENOX) injection  120 mg Subcutaneous Q12H  . ezetimibe  10 mg Oral Daily  . feeding supplement (PRO-STAT SUGAR FREE 64)  30 mL Per Tube BID  . feeding supplement (VITAL HIGH PROTEIN)  1,000 mL Per Tube Q24H  . insulin aspart  0-15 Units Subcutaneous Q4H  . insulin detemir  11 Units Subcutaneous BID  . levothyroxine  175 mcg Oral QAC breakfast  . loratadine  10 mg Oral Daily  . mouth rinse  15 mL Mouth Rinse q12n4p  . mouth rinse  15 mL Mouth Rinse 10 times per day  . metoprolol tartrate  25 mg Oral BID  . mirabegron ER  50 mg Oral Daily  . pantoprazole  40 mg Oral Daily  . propofol      . sterile water (preservative free)      . succinylcholine      . traZODone  50 mg Oral QHS  . vecuronium      . vitamin C  500 mg Oral Daily  . zinc sulfate  220 mg Oral Daily   Continuous Infusions: . sodium chloride 250 mL (12/26/18 0912)  . fentaNYL infusion INTRAVENOUS 25 mcg/hr (01/09/19 1127)  . propofol (DIPRIVAN) infusion 15 mcg/kg/min (01/09/19 1136)     LOS: 16 days    Time spent: over 30 min  Fayrene Helper, MD [facility] Pager AMION  If 7PM-7AM, please contact night-coverage [email or web address] Password [facility] 01/09/2019, 11:58 AM

## 2019-01-09 NOTE — Progress Notes (Signed)
ANTICOAGULATION CONSULT NOTE - Follow Up Consult  Pharmacy Consult for Lovenox Indication: atrial fibrillation  Allergies  Allergen Reactions  . Lipitor [Atorvastatin] Other (See Comments)    Breast lumps    Patient Measurements: Height: 5\' 11"  (180.3 cm) Weight: 257 lb 0.9 oz (116.6 kg) IBW/kg (Calculated) : 75.3  Vital Signs: Temp: 97.6 F (36.4 C) (12/24 0430) Temp Source: Axillary (12/24 0430) BP: 97/53 (12/24 0750) Pulse Rate: 81 (12/24 0750)  Labs: Recent Labs    01/07/19 0259 01/07/19 0259 01/08/19 0635 01/08/19 1438 01/08/19 1900 01/09/19 0445 01/09/19 1250  HGB 19.2*   < > 19.0*  --   --  15.0 16.0  HCT 56.3*  --  56.1*  --   --  45.1 47.0  PLT 165  --  171  --   --  103*  --   CREATININE 0.98  --   --  0.97 1.29* 1.19  --    < > = values in this interval not displayed.    Estimated Creatinine Clearance: 68.6 mL/min (by C-G formula based on SCr of 1.19 mg/dL).   Medical History: Past Medical History:  Diagnosis Date  . 10 year risk of MI or stroke 7.5% or greater 04/28/2016  . Allergic rhinitis, cause unspecified   . BPH (benign prostatic hyperplasia) 10/18/2015  . Diverticulosis   . ED (erectile dysfunction)   . Essential hypertension, benign   . Hernia, umbilical   . Lump or mass in breast   . Mixed hyperlipidemia   . Obesity   . Pacemaker    [place], [place] 2010  . Postablative hypothyroidism   . Sleep apnea    Use CPAP machine  . Ureteral stone    Dr. Risa Grill    Medications:  Scheduled:  . alfuzosin  10 mg Oral QHS  . chlorhexidine  15 mL Mouth Rinse BID  . Chlorhexidine Gluconate Cloth  6 each Topical Daily  . dextromethorphan-guaiFENesin  1 tablet Oral BID  . enoxaparin (LOVENOX) injection  120 mg Subcutaneous Q12H  . ezetimibe  10 mg Oral Daily  . feeding supplement (PRO-STAT SUGAR FREE 64)  30 mL Per Tube TID  . insulin aspart  0-15 Units Subcutaneous Q4H  . insulin detemir  11 Units Subcutaneous BID  . levothyroxine  175 mcg  Oral QAC breakfast  . loratadine  10 mg Oral Daily  . mouth rinse  15 mL Mouth Rinse q12n4p  . mouth rinse  15 mL Mouth Rinse 10 times per day  . metoprolol tartrate  25 mg Oral BID  . mirabegron ER  50 mg Oral Daily  . pantoprazole  40 mg Oral Daily  . propofol      . sterile water (preservative free)      . succinylcholine      . traZODone  50 mg Oral QHS  . vecuronium      . vitamin C  500 mg Oral Daily  . zinc sulfate  220 mg Oral Daily   Infusions:  . sodium chloride 250 mL (12/26/18 0912)  . feeding supplement (VITAL 1.5 CAL)    . fentaNYL infusion INTRAVENOUS 25 mcg/hr (01/09/19 1127)  . propofol (DIPRIVAN) infusion 15 mcg/kg/min (01/09/19 1136)   PRN: sodium chloride, acetaminophen, ALPRAZolam, bisacodyl, chlorpheniramine-HYDROcodone, fentaNYL, guaiFENesin-dextromethorphan, midazolam, midazolam, nitroGLYCERIN, ondansetron (ZOFRAN) IV, sennosides  Assessment: 78 yo male admitted 12/26/2018 with acute hypoxic respiratory failure in the setting of COVID-19 viral illness now on therapeutic Lovenox for Afib. CHADSVASc 3.   H/H remains  stable and wnl. Plt have trended down to 103k today. SCr wnl   Goal of Therapy:  Anti-Xa level 0.6-1 units/ml 4hrs after LMWH dose given Monitor platelets by anticoagulation protocol: Yes   Plan:  Continue Lovenox to 1mg /kg SQ q12h. Will decrease dose slightly to 115 mg twice daily  Monitor renal function, CBC, signs/symptoms of bleeding  Albertina Parr, PharmD., BCPS Clinical Pharmacist Clinical phone for 01/09/19 until 5pm: [phone]

## 2019-01-09 NOTE — Progress Notes (Addendum)
1030: Decision made to intubate patient. Patient states he does not wish to call family at this time.  1033: Family updated on plan to intubate patient by MD McQuaid. All questions and concerns answered at this time.  1043: Patient intubated. 20 of Etomidate, 2 of Versed, 50 of fentanyl, 100 Roc.   1047: Patient hypotensive. 1L NS infusing.  1100: MD McQuaid at bedside placing central line

## 2019-01-09 NOTE — Progress Notes (Signed)
Consent obtained for PICC at 1101.  Noted that doctor is placing central line at this time, will place consent of chart.

## 2019-01-09 NOTE — Progress Notes (Signed)
NAME:  Arthur Daniel, MRN:  [id number], DOB:  02-03-1940, LOS: 39 ADMISSION DATE:  01/01/2019, CONSULTATION DATE:  12/20 REFERRING MD:  Florene Glen, CHIEF COMPLAINT:  Dyspnea   Brief History   78 y/o male admitted on 12/8 for severe acute respiratory failure with hypoxemia due to COVID 19 pneumonia.   Past Medical History  Pacemaker in place Hypertension Hypothyroidism Hyperlipidemia BPH  Significant Hospital Events   01/12/2019 admitted 12/15 transfer to ICU  12/23 BIPAP per [facility] MD  Consults:  PCCM  Procedures:    Significant Diagnostic Tests:  12/13 CT chest images personally reviewed> emphysema upper lobes, bilateral ggo  Micro Data:  12/8 sars cov 2 > positive 12/8 blood >   Antimicrobials:  12/8 remdesivir > 12/12 12/8 tocilizumab >   Interim history/subjective:  Has been on BIPAP since 12/23, but not able to come off this morning because he is lethargic  O2 saturation in 60's while off BIPAP this morning, incerased work of breathing off BIPAP Increased work of breathing in last 24 hours  Objective   Blood pressure (!) 97/45, pulse 85, temperature 97.6 F (36.4 C), temperature source Axillary, resp. rate (!) 23, height 5\' 11"  (1.803 m), weight 116.6 kg, SpO2 90 %.    Vent Mode: BIPAP;PCV FiO2 (%):  [100 %] 100 % Set Rate:  [12 bmp] 12 bmp PEEP:  [8 cmH20-10 cmH20] 8 cmH20   Intake/Output Summary (Last 24 hours) at 01/09/2019 0813 Last data filed at 01/09/2019 0600 Gross per 24 hour  Intake 474 ml  Output 1250 ml  Net -776 ml   Filed Weights   01/06/19 0353 01/07/19 0456 01/09/19 0500  Weight: 121.3 kg 116.6 kg 116.6 kg    Examination:  General:  Increased work of breathing on NIMV in bed HENT: NCAT OP clear PULM: Crackles bases B, normal effort CV: RRR, no mgr GI: BS+, soft, nontender MSK: normal bulk and tone Neuro: awake, alert, MAEW   12/24 CXR > L>R airspace disease, likely left effusion  Resolved Hospital Problem list      Assessment & Plan:  Severe acute respiratory Failure with hypoxemia due to COVID 19> worsening, desires full code status; I explained to him on 12/24 that his chances of survival at this point even with mechanical ventilation are less than 20-30%.  I also explained that even with mechanical ventilation (his best chance of survival at this point) that he will have to endure significant pain and suffering and will be very weak at the end of this.  He knows that he may not surive a course on the ventilator but wants to try it. D/C NIMV Intubate now Continue mechanical ventilation per ARDS protocol Target TVol 6-8cc/kgIBW Target Plateau Pressure < 30cm H20 Target driving pressure less than 15 cm of water Target PaO2 55-65: titrate PEEP/FiO2 per protocol As long as PaO2 to FiO2 ratio is less than 1:150 position in prone position for 16 hours a day Check CVP daily if CVL in place Target CVP less than 4, diurese as necessary Ventilator associated pneumonia prevention protocol  Need for sedation for mechanical ventilation Start fentanyl, porpofol infusions titrated to RASS target -2 per PAD protocol  Afib lovenox full dose tele   Best practice:  Diet: start tube feeding Pain/Anxiety/Delirium protocol (if indicated): yes as abo ve VAP protocol (if indicated): yes DVT prophylaxis: full dose lovenox GI prophylaxis: ppi Glucose control: SSI Mobility: bed rest Code Status: full Family Communication: I updated his wife Sunday Spillers by phone Disposition:  remain in ICU  Labs   CBC: Recent Labs  Lab 01/04/19 0414 01/05/19 0245 01/06/19 0302 01/07/19 0259 01/08/19 0635  WBC 15.5* 14.3* 14.8* 12.8* 13.3*  NEUTROABS 14.6* 13.2* 13.5* 11.6* 12.4*  HGB 19.0* 18.8* 19.7* 19.2* 19.0*  HCT 56.0* 55.3* 58.2* 56.3* 56.1*  MCV 92.6 91.6 94.5 92.9 93.0  PLT 192 161 176 165 [id number]    Basic Metabolic Panel: Recent Labs  Lab 01/03/19 0245 01/05/19 0245 01/06/19 0302 01/07/19 0259 01/08/19 1438  01/08/19 1900 01/09/19 0445  NA 138 137 137 136 142 139 138  K 4.3 4.0 4.1 3.9 2.7* 4.0 4.7  CL 96* 99 96* 96* 113* 97* 95*  CO2 28 25 24 27 22 29 30   GLUCOSE 198* 206* 145* 124* 171* 210* 150*  BUN 42* 54* 51* 46* 52* 68* 65*  CREATININE 1.08 1.12 1.25* 0.98 0.97 1.29* 1.19  CALCIUM 8.7* 8.5* 8.1* 8.1* 5.8* 8.8* 9.5  MG 2.7* 2.8* 2.7* 2.8* 2.1  --  3.1*  PHOS 4.5  --  4.3  --  3.6  --  5.2*   GFR: Estimated Creatinine Clearance: 68.6 mL/min (by C-G formula based on SCr of 1.19 mg/dL). Recent Labs  Lab 01/05/19 0245 01/06/19 0302 01/07/19 0259 01/08/19 0635  PROCALCITON  --  0.13 0.12  --   WBC 14.3* 14.8* 12.8* 13.3*    Liver Function Tests: Recent Labs  Lab 01/05/19 0245 01/06/19 0302 01/07/19 0259 01/08/19 1438 01/09/19 0445  AST 38 41 42* 24 43*  ALT 98* 101* 105* 54* 88*  ALKPHOS 45 49 47 33* 72  BILITOT 0.9 1.0 1.4* 0.6 0.7  PROT 6.0* 6.0* 5.9* 3.7* 5.9*  ALBUMIN 3.1* 3.1* 3.0* 1.8* 2.8*   No results for input(s): LIPASE, AMYLASE in the last 168 hours. No results for input(s): AMMONIA in the last 168 hours.  ABG    Component Value Date/Time   TCO2 29 11/24/2008 1620     Coagulation Profile: No results for input(s): INR, PROTIME in the last 168 hours.  Cardiac Enzymes: No results for input(s): CKTOTAL, CKMB, CKMBINDEX, TROPONINI in the last 168 hours.  HbA1C: Hgb A1c MFr Bld  Date/Time Value Ref Range Status  11/25/2018 08:11 AM 6.2 4.6 - 6.5 % Final    Comment:    Glycemic Control Guidelines for People with Diabetes:Non Diabetic:  <6%Goal of Therapy: <7%Additional Action Suggested:  >8%   08/20/2018 01:08 PM 6.3 4.6 - 6.5 % Final    Comment:    Glycemic Control Guidelines for People with Diabetes:Non Diabetic:  <6%Goal of Therapy: <7%Additional Action Suggested:  >8%     CBG: Recent Labs  Lab 01/08/19 1203 01/08/19 1755 01/08/19 1949 01/08/19 2322 01/09/19 0327  GLUCAP 205* 180* 236* 148* 206*     Critical care time: 40 minutes      Roselie Awkward, MD [place] [facility] Pager: [phone] Cell: [phone] If no response, call [phone]

## 2019-01-09 NOTE — Progress Notes (Signed)
[facility] PCCM Evening Rounds  Persistent tachycardia Levophed requirement improved with vasopressin, LR bolus Current CVP 10 Concern for sepsis, HCAP with left lung infiltrate Continue levophed, vaso, no more fluids with current CVP Check lactic acid Add empiric vanc/cefepime resp culture  Roselie Awkward, MD [place] [facility] Pager: [phone] Cell: [phone] If no response, call [phone]

## 2019-01-09 NOTE — Progress Notes (Signed)
Pharmacy Antibiotic Note  Arthur Daniel is a 78 y.o. male admitted on 01/12/2019 with pneumonia.  Pharmacy has been consulted for vanc/cefepime dosing.  Pt has been here for COVID. Concern for sepsis/PNA at this point. Vanc and cefepime ordered empirically. His MRSA PCR was neg about a week ago. We will repeat to optimize abx if it's neg again.   Scr 1.19, CrCl 51ml/min   Plan: Vanc 1.75g IV q24>>AUC 450, scr 1.19 Cefepime 2g IV q8 MRSA PCR F/u with level if needed  Height: 5\' 11"  (180.3 cm) Weight: 257 lb 0.9 oz (116.6 kg) IBW/kg (Calculated) : 75.3  Temp (24hrs), Avg:97.7 F (36.5 C), Min:97.6 F (36.4 C), Max:97.7 F (36.5 C)  Recent Labs  Lab 01/05/19 0245 01/06/19 0302 01/07/19 0259 01/08/19 0635 01/08/19 1438 01/08/19 1900 01/09/19 0445  WBC 14.3* 14.8* 12.8* 13.3*  --   --  11.5*  CREATININE 1.12 1.25* 0.98  --  0.97 1.29* 1.19    Estimated Creatinine Clearance: 68.6 mL/min (by C-G formula based on SCr of 1.19 mg/dL).    Allergies  Allergen Reactions  . Lipitor [Atorvastatin] Other (See Comments)    Breast lumps    Antimicrobials this admission: 12/24 vanc>> 12/24 cefepime>>  Dose adjustments this admission:   Microbiology results: 12/8 blood>>neg 12/16 MRSA PCR>>neg  Onnie Boer, PharmD, BCIDP, AAHIVP, CPP Infectious Disease Pharmacist 01/09/2019 6:12 PM

## 2019-01-10 ENCOUNTER — Inpatient Hospital Stay (HOSPITAL_COMMUNITY): Payer: Medicare HMO

## 2019-01-10 DIAGNOSIS — R0600 Dyspnea, unspecified: Secondary | ICD-10-CM

## 2019-01-10 LAB — POCT I-STAT EG7
Acid-base deficit: 3 mmol/L — ABNORMAL HIGH (ref 0.0–2.0)
Bicarbonate: 25.8 mmol/L (ref 20.0–28.0)
Calcium, Ion: 1.09 mmol/L — ABNORMAL LOW (ref 1.15–1.40)
HCT: 45 % (ref 39.0–52.0)
Hemoglobin: 15.3 g/dL (ref 13.0–17.0)
O2 Saturation: 57 %
Potassium: 4.6 mmol/L (ref 3.5–5.1)
Sodium: 142 mmol/L (ref 135–145)
TCO2: 28 mmol/L (ref 22–32)
pCO2, Ven: 61.3 mmHg — ABNORMAL HIGH (ref 44.0–60.0)
pH, Ven: 7.233 — ABNORMAL LOW (ref 7.250–7.430)
pO2, Ven: 36 mmHg (ref 32.0–45.0)

## 2019-01-10 LAB — CBC WITH DIFFERENTIAL/PLATELET
Abs Immature Granulocytes: 0.13 10*3/uL — ABNORMAL HIGH (ref 0.00–0.07)
Basophils Absolute: 0 10*3/uL (ref 0.0–0.1)
Basophils Relative: 0 %
Eosinophils Absolute: 0.4 10*3/uL (ref 0.0–0.5)
Eosinophils Relative: 3 %
HCT: 49.2 % (ref 39.0–52.0)
Hemoglobin: 15.5 g/dL (ref 13.0–17.0)
Immature Granulocytes: 1 %
Lymphocytes Relative: 7 %
Lymphs Abs: 0.8 10*3/uL (ref 0.7–4.0)
MCH: 31.6 pg (ref 26.0–34.0)
MCHC: 31.5 g/dL (ref 30.0–36.0)
MCV: 100.4 fL — ABNORMAL HIGH (ref 80.0–100.0)
Monocytes Absolute: 0.4 10*3/uL (ref 0.1–1.0)
Monocytes Relative: 3 %
Neutro Abs: 10.9 10*3/uL — ABNORMAL HIGH (ref 1.7–7.7)
Neutrophils Relative %: 86 %
Platelets: 185 10*3/uL (ref 150–400)
RBC: 4.9 MIL/uL (ref 4.22–5.81)
RDW: 13.8 % (ref 11.5–15.5)
WBC: 12.7 10*3/uL — ABNORMAL HIGH (ref 4.0–10.5)
nRBC: 0.6 % — ABNORMAL HIGH (ref 0.0–0.2)

## 2019-01-10 LAB — COMPREHENSIVE METABOLIC PANEL
ALT: 76 U/L — ABNORMAL HIGH (ref 0–44)
AST: 36 U/L (ref 15–41)
Albumin: 2.6 g/dL — ABNORMAL LOW (ref 3.5–5.0)
Alkaline Phosphatase: 52 U/L (ref 38–126)
Anion gap: 10 (ref 5–15)
BUN: 71 mg/dL — ABNORMAL HIGH (ref 8–23)
CO2: 23 mmol/L (ref 22–32)
Calcium: 7.9 mg/dL — ABNORMAL LOW (ref 8.9–10.3)
Chloride: 108 mmol/L (ref 98–111)
Creatinine, Ser: 1.59 mg/dL — ABNORMAL HIGH (ref 0.61–1.24)
GFR calc Af Amer: 48 mL/min — ABNORMAL LOW (ref >60)
GFR calc non Af Amer: 42 mL/min — ABNORMAL LOW (ref >60)
Glucose, Bld: 198 mg/dL — ABNORMAL HIGH (ref 70–99)
Potassium: 4.9 mmol/L (ref 3.5–5.1)
Sodium: 141 mmol/L (ref 135–145)
Total Bilirubin: 1.4 mg/dL — ABNORMAL HIGH (ref 0.3–1.2)
Total Protein: 5.8 g/dL — ABNORMAL LOW (ref 6.5–8.1)

## 2019-01-10 LAB — POCT I-STAT 7, (LYTES, BLD GAS, ICA,H+H)
Acid-base deficit: 4 mmol/L — ABNORMAL HIGH (ref 0.0–2.0)
Acid-base deficit: 5 mmol/L — ABNORMAL HIGH (ref 0.0–2.0)
Bicarbonate: 26.8 mmol/L (ref 20.0–28.0)
Bicarbonate: 23.8 mmol/L (ref 20.0–28.0)
Calcium, Ion: 1.15 mmol/L (ref 1.15–1.40)
Calcium, Ion: 1.1 mmol/L — ABNORMAL LOW (ref 1.15–1.40)
HCT: 44 % (ref 39.0–52.0)
HCT: 43 % (ref 39.0–52.0)
Hemoglobin: 15 g/dL (ref 13.0–17.0)
Hemoglobin: 14.6 g/dL (ref 13.0–17.0)
O2 Saturation: 68 %
O2 Saturation: 78 %
Patient temperature: 37.5
Potassium: 4 mmol/L (ref 3.5–5.1)
Potassium: 4.4 mmol/L (ref 3.5–5.1)
Sodium: 141 mmol/L (ref 135–145)
Sodium: 140 mmol/L (ref 135–145)
TCO2: 29 mmol/L (ref 22–32)
TCO2: 26 mmol/L (ref 22–32)
pCO2 arterial: 81.1 mmHg (ref 32.0–48.0)
pCO2 arterial: 60.7 mmHg — ABNORMAL HIGH (ref 32.0–48.0)
pH, Arterial: 7.131 — CL (ref 7.350–7.450)
pH, Arterial: 7.202 — ABNORMAL LOW (ref 7.350–7.450)
pO2, Arterial: 49 mmHg — ABNORMAL LOW (ref 83.0–108.0)
pO2, Arterial: 52 mmHg — ABNORMAL LOW (ref 83.0–108.0)

## 2019-01-10 LAB — LACTIC ACID, PLASMA: Lactic Acid, Venous: 2.4 mmol/L (ref 0.5–1.9)

## 2019-01-10 LAB — CBC
HCT: 48.2 % (ref 39.0–52.0)
Hemoglobin: 14.9 g/dL (ref 13.0–17.0)
MCH: 31.7 pg (ref 26.0–34.0)
MCHC: 30.9 g/dL (ref 30.0–36.0)
MCV: 102.6 fL — ABNORMAL HIGH (ref 80.0–100.0)
Platelets: 201 10*3/uL (ref 150–400)
RBC: 4.7 MIL/uL (ref 4.22–5.81)
RDW: 13.8 % (ref 11.5–15.5)
WBC: 16.5 10*3/uL — ABNORMAL HIGH (ref 4.0–10.5)
nRBC: 0.8 % — ABNORMAL HIGH (ref 0.0–0.2)

## 2019-01-10 LAB — BASIC METABOLIC PANEL
Anion gap: 11 (ref 5–15)
BUN: 70 mg/dL — ABNORMAL HIGH (ref 8–23)
CO2: 23 mmol/L (ref 22–32)
Calcium: 7.6 mg/dL — ABNORMAL LOW (ref 8.9–10.3)
Chloride: 110 mmol/L (ref 98–111)
Creatinine, Ser: 1.57 mg/dL — ABNORMAL HIGH (ref 0.61–1.24)
GFR calc Af Amer: 49 mL/min — ABNORMAL LOW (ref >60)
GFR calc non Af Amer: 42 mL/min — ABNORMAL LOW (ref >60)
Glucose, Bld: 301 mg/dL — ABNORMAL HIGH (ref 70–99)
Potassium: 4.4 mmol/L (ref 3.5–5.1)
Sodium: 144 mmol/L (ref 135–145)

## 2019-01-10 LAB — GLUCOSE, CAPILLARY
Glucose-Capillary: 137 mg/dL — ABNORMAL HIGH (ref 70–99)
Glucose-Capillary: 130 mg/dL — ABNORMAL HIGH (ref 70–99)
Glucose-Capillary: 174 mg/dL — ABNORMAL HIGH (ref 70–99)
Glucose-Capillary: 307 mg/dL — ABNORMAL HIGH (ref 70–99)
Glucose-Capillary: 354 mg/dL — ABNORMAL HIGH (ref 70–99)
Glucose-Capillary: 276 mg/dL — ABNORMAL HIGH (ref 70–99)

## 2019-01-10 LAB — TROPONIN I (HIGH SENSITIVITY)
Troponin I (High Sensitivity): 155 ng/L (ref <18)
Troponin I (High Sensitivity): 439 ng/L (ref <18)

## 2019-01-10 LAB — PHOSPHORUS
Phosphorus: 6.4 mg/dL — ABNORMAL HIGH (ref 2.5–4.6)
Phosphorus: 5.8 mg/dL — ABNORMAL HIGH (ref 2.5–4.6)

## 2019-01-10 LAB — ECHOCARDIOGRAM COMPLETE
Height: 71 in
Weight: 4197.56 oz

## 2019-01-10 LAB — D-DIMER, QUANTITATIVE: D-Dimer, Quant: 0.51 ug/mL-FEU — ABNORMAL HIGH (ref 0.00–0.50)

## 2019-01-10 LAB — TRIGLYCERIDES: Triglycerides: 307 mg/dL — ABNORMAL HIGH (ref <150)

## 2019-01-10 LAB — FERRITIN: Ferritin: 329 ng/mL (ref 24–336)

## 2019-01-10 LAB — C-REACTIVE PROTEIN: CRP: 10.6 mg/dL — ABNORMAL HIGH (ref <1.0)

## 2019-01-10 LAB — MAGNESIUM
Magnesium: 2.6 mg/dL — ABNORMAL HIGH (ref 1.7–2.4)
Magnesium: 2.7 mg/dL — ABNORMAL HIGH (ref 1.7–2.4)

## 2019-01-10 MED ORDER — ARTIFICIAL TEARS OPHTHALMIC OINT
1.0000 "application " | TOPICAL_OINTMENT | Freq: Three times a day (TID) | OPHTHALMIC | Status: DC
  Administered 2019-01-10 – 2019-01-13 (×10): 1 via OPHTHALMIC
  Filled 2019-01-10 (×2): qty 3.5

## 2019-01-10 MED ORDER — NOREPINEPHRINE 16 MG/250ML-% IV SOLN
0.0000 ug/min | INTRAVENOUS | Status: DC
  Administered 2019-01-10 (×2): 40 ug/min via INTRAVENOUS
  Administered 2019-01-11: 12:00:00 36.267 ug/min via INTRAVENOUS
  Administered 2019-01-11: 05:00:00 40 ug/min via INTRAVENOUS
  Administered 2019-01-11: 21:00:00 27 ug/min via INTRAVENOUS
  Administered 2019-01-12: 20:00:00 18 ug/min via INTRAVENOUS
  Administered 2019-01-13 (×2): 18.005 ug/min via INTRAVENOUS
  Filled 2019-01-10 (×7): qty 250

## 2019-01-10 MED ORDER — FENTANYL CITRATE (PF) 100 MCG/2ML IJ SOLN: 25.0000 ug | Freq: Once | INTRAMUSCULAR | Status: DC

## 2019-01-10 MED ORDER — FENTANYL BOLUS VIA INFUSION
25.0000 ug | INTRAVENOUS | Status: DC | PRN
  Filled 2019-01-10: qty 25

## 2019-01-10 MED ORDER — SODIUM BICARBONATE-DEXTROSE 150-5 MEQ/L-% IV SOLN
150.0000 meq | INTRAVENOUS | Status: DC
  Administered 2019-01-10 – 2019-01-11 (×5): 150 meq via INTRAVENOUS
  Filled 2019-01-10 (×9): qty 1000

## 2019-01-10 MED ORDER — SODIUM BICARBONATE 8.4 % IV SOLN
INTRAVENOUS | Status: DC
  Filled 2019-01-10: qty 150

## 2019-01-10 MED ORDER — VANCOMYCIN HCL 1250 MG/250ML IV SOLN
1250.0000 mg | INTRAVENOUS | Status: DC
  Administered 2019-01-10 – 2019-01-11 (×2): 1250 mg via INTRAVENOUS
  Filled 2019-01-10 (×3): qty 250

## 2019-01-10 MED ORDER — STERILE WATER FOR INJECTION IV SOLN: INTRAVENOUS | Status: DC

## 2019-01-10 MED ORDER — ENOXAPARIN SODIUM 60 MG/0.6ML ~~LOC~~ SOLN
60.0000 mg | Freq: Two times a day (BID) | SUBCUTANEOUS | Status: DC
  Administered 2019-01-10 – 2019-01-13 (×6): 60 mg via SUBCUTANEOUS
  Filled 2019-01-10 (×6): qty 0.6

## 2019-01-10 MED ORDER — MIDAZOLAM BOLUS VIA INFUSION
1.0000 mg | INTRAVENOUS | Status: DC | PRN
  Filled 2019-01-10: qty 2

## 2019-01-10 MED ORDER — EPINEPHRINE HCL 5 MG/250ML IV SOLN IN NS
0.5000 ug/min | INTRAVENOUS | Status: DC
  Administered 2019-01-10 – 2019-01-11 (×6): 20 ug/min via INTRAVENOUS
  Filled 2019-01-10 (×6): qty 250

## 2019-01-10 MED ORDER — MIDAZOLAM 50MG/50ML (1MG/ML) PREMIX INFUSION
2.0000 mg/h | INTRAVENOUS | Status: DC
  Administered 2019-01-10 – 2019-01-12 (×5): 10 mg/h via INTRAVENOUS
  Filled 2019-01-10 (×7): qty 50

## 2019-01-10 MED ORDER — FENTANYL 2500MCG IN NS 250ML (10MCG/ML) PREMIX INFUSION
25.0000 ug/h | INTRAVENOUS | Status: DC
  Administered 2019-01-10 – 2019-01-12 (×4): 200 ug/h via INTRAVENOUS
  Administered 2019-01-13: 02:00:00 125 ug/h via INTRAVENOUS
  Filled 2019-01-10 (×5): qty 250

## 2019-01-10 MED ORDER — VECURONIUM BROMIDE 10 MG IV SOLR
0.0000 ug/kg/min | INTRAVENOUS | Status: DC
  Administered 2019-01-10 – 2019-01-11 (×3): 1 ug/kg/min via INTRAVENOUS
  Administered 2019-01-11: 18:00:00 1.001 ug/kg/min via INTRAVENOUS
  Filled 2019-01-10 (×5): qty 100

## 2019-01-10 MED ORDER — STERILE WATER FOR INJECTION IV SOLN
INTRAVENOUS | Status: DC
  Filled 2019-01-10: qty 850

## 2019-01-10 MED ORDER — VECURONIUM BOLUS VIA INFUSION
0.0800 mg/kg | Freq: Once | INTRAVENOUS | Status: AC
  Administered 2019-01-10: 17:00:00 9.5 mg via INTRAVENOUS
  Filled 2019-01-10: qty 10

## 2019-01-10 NOTE — Progress Notes (Signed)
Patient with witnessed PEA arrest @ 10:04a. 2 rounds of CPR performed, 1 amp Epi, 1 amp bicarbonate. ROSC achieved. Sodium bicarbonate, epi, and nimbex gtt added.   R and L chest tubes inserted at bedside by MD McQuaid.   MD McQuaid spoke with patients wife. Plan to make patient DNR. Patient too unstable at this time to prone.

## 2019-01-10 NOTE — Progress Notes (Signed)
[facility] PCCM  Remains on multiple vasopressors Oxygenation about the same, perhaps slightly better Dyssynchrony  Change to continuous paralytic protocol for better vent synchrony  Roselie Awkward, MD [place] [facility] Pager: [phone] Cell: [phone] If no response, call [phone]

## 2019-01-10 NOTE — Progress Notes (Signed)
NAME:  Arthur Daniel, MRN:  [id number], DOB:  [birth year], LOS: 69 ADMISSION DATE:  01/07/2019, CONSULTATION DATE:  12/20 REFERRING MD:  Florene Glen, CHIEF COMPLAINT:  Dyspnea   Brief History   77 y/o male admitted on 12/8 for severe acute respiratory failure with hypoxemia due to COVID 19 pneumonia.   Past Medical History  Pacemaker in place Hypertension Hypothyroidism Hyperlipidemia BPH  Significant Hospital Events   12/29/2018 admitted 12/15 transfer to ICU  12/23 BIPAP per [facility] MD  Consults:  PCCM  Procedures:    Significant Diagnostic Tests:  12/13 CT chest images personally reviewed> emphysema upper lobes, bilateral ggo  Micro Data:  12/8 sars cov 2 > positive 12/8 blood >   Antimicrobials:  12/8 remdesivir > 12/12 12/8 tocilizumab >   Interim history/subjective:   Worsening shock Crepitance R neck Mottled feet No DP pulse R foot Peak pressure 38  Objective   Blood pressure (!) 102/54, pulse (!) 116, temperature 98.9 F (37.2 C), temperature source Oral, resp. rate (!) 26, height 5\' 11"  (1.803 m), weight 119 kg, SpO2 94 %. CVP:  [10 mmHg-21 mmHg] 17 mmHg  Vent Mode: PRVC FiO2 (%):  [100 %] 100 % Set Rate:  [12 bmp-32 bmp] 32 bmp Vt Set:  [560 mL] 560 mL PEEP:  [18 cmH20] 18 cmH20 Plateau Pressure:  [33 cmH20-36 cmH20] 36 cmH20   Intake/Output Summary (Last 24 hours) at 01/10/2019 0731 Last data filed at 01/10/2019 0600 Gross per 24 hour  Intake 2920.69 ml  Output 1020 ml  Net 1900.69 ml   Filed Weights   01/07/19 0456 01/09/19 0500 01/10/19 0500  Weight: 116.6 kg 116.6 kg 119 kg    Examination:  General:  In bed on vent HENT: NCAT ETT in place PULM: CTA B, vent supported breathing CV: RRR, no mgr GI: BS+, soft, nontender Derm: mottled feet, cold to touch Neuro: sedated on vent   12/24 CXR > L>R airspace disease, likely left effusion  Resolved Hospital Problem list     Assessment & Plan:  Severe acute respiratory Failure with  hypoxemia due to COVID 19> Continue mechanical ventilation per ARDS protocol Target TVol 6-8cc/kgIBW Target Plateau Pressure < 30cm H20 Target driving pressure less than 15 cm of water Target PaO2 55-65: titrate PEEP/FiO2 per protocol As long as PaO2 to FiO2 ratio is less than 1:150 position in prone position for 16 hours a day Check CVP daily if CVL in place Target CVP less than 4, diurese as necessary Ventilator associated pneumonia prevention protocol 12/25 would like to prone after procedures (echo, vasc ultrasound)  Septic Shock, but no clear new infection, could be cardiogenic shock Check SvO2 Check echo Continue levophed, vasopressin Consider dobutamine considering result of SvO2 Continue empiric antibiotics for HCAP F/u resp culture  Need for sedation for mechanical ventilation Continue fentanyl/versed infusions Continue to Target -2 RASS However if continued vent dyssynchrony may need deeper sedation and NMB  Afib Lovenox Tele   Best practice:  Diet: start tube feeding Pain/Anxiety/Delirium protocol (if indicated): yes as abo ve VAP protocol (if indicated): yes DVT prophylaxis: full dose lovenox GI prophylaxis: ppi Glucose control: SSI Mobility: bed rest Code Status: full Family Communication:per TRH Disposition: remain in ICU  Labs   CBC: Recent Labs  Lab 01/05/19 0245 01/06/19 0302 01/07/19 0259 01/08/19 0635 01/09/19 0445 01/09/19 1250  WBC 14.3* 14.8* 12.8* 13.3* 11.5*  --   NEUTROABS 13.2* 13.5* 11.6* 12.4* 10.3*  --   HGB 18.8* 19.7* 19.2* 19.0* 15.0  16.0  HCT 55.3* 58.2* 56.3* 56.1* 45.1 47.0  MCV 91.6 94.5 92.9 93.0 94.0  --   PLT 161 176 165 171 103*  --     Basic Metabolic Panel: Recent Labs  Lab 01/06/19 0302 01/07/19 0259 01/08/19 1438 01/08/19 1900 01/09/19 0445 01/09/19 1250 01/09/19 1703  NA 137 136 142 139 138 138  --   K 4.1 3.9 2.7* 4.0 4.7 4.0  --   CL 96* 96* 113* 97* 95*  --   --   CO2 24 27 22 29 30   --   --    GLUCOSE 145* 124* 171* 210* 150*  --   --   BUN 51* 46* 52* 68* 65*  --   --   CREATININE 1.25* 0.98 0.97 1.29* 1.19  --   --   CALCIUM 8.1* 8.1* 5.8* 8.8* 9.5  --   --   MG 2.7* 2.8* 2.1  --  3.1*  --  2.6*  PHOS 4.3  --  3.6  --  5.2*  --  7.0*   GFR: Estimated Creatinine Clearance: 69.3 mL/min (by C-G formula based on SCr of 1.19 mg/dL). Recent Labs  Lab 01/06/19 0302 01/07/19 0259 01/08/19 0635 01/09/19 0445 01/09/19 1920  PROCALCITON 0.13 0.12  --   --   --   WBC 14.8* 12.8* 13.3* 11.5*  --   LATICACIDVEN  --   --   --   --  2.7*    Liver Function Tests: Recent Labs  Lab 01/05/19 0245 01/06/19 0302 01/07/19 0259 01/08/19 1438 01/09/19 0445  AST 38 41 42* 24 43*  ALT 98* 101* 105* 54* 88*  ALKPHOS 45 49 47 33* 72  BILITOT 0.9 1.0 1.4* 0.6 0.7  PROT 6.0* 6.0* 5.9* 3.7* 5.9*  ALBUMIN 3.1* 3.1* 3.0* 1.8* 2.8*   No results for input(s): LIPASE, AMYLASE in the last 168 hours. No results for input(s): AMMONIA in the last 168 hours.  ABG    Component Value Date/Time   PHART 7.341 (L) 01/09/2019 1250   PCO2ART 49.4 (H) 01/09/2019 1250   PO2ART 53.0 (L) 01/09/2019 1250   HCO3 26.7 01/09/2019 1250   TCO2 28 01/09/2019 1250   O2SAT 85.0 01/09/2019 1250     Coagulation Profile: No results for input(s): INR, PROTIME in the last 168 hours.  Cardiac Enzymes: No results for input(s): CKTOTAL, CKMB, CKMBINDEX, TROPONINI in the last 168 hours.  HbA1C: Hgb A1c MFr Bld  Date/Time Value Ref Range Status  11/25/2018 08:11 AM 6.2 4.6 - 6.5 % Final    Comment:    Glycemic Control Guidelines for People with Diabetes:Non Diabetic:  <6%Goal of Therapy: <7%Additional Action Suggested:  >8%   08/20/2018 01:08 PM 6.3 4.6 - 6.5 % Final    Comment:    Glycemic Control Guidelines for People with Diabetes:Non Diabetic:  <6%Goal of Therapy: <7%Additional Action Suggested:  >8%     CBG: Recent Labs  Lab 01/09/19 0752 01/09/19 1140 01/09/19 1753 01/10/19 0057 01/10/19 0325   GLUCAP 143* 168* 178* 137* 130*     Critical care time: 32 minutes     Roselie Awkward, MD [place] [facility] Pager: [phone] Cell: [phone] If no response, call [phone]

## 2019-01-10 NOTE — Progress Notes (Signed)
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   Doubt he is stable enough for family visit.  Roselie Awkward, MD [place] [facility] Pager: [phone] Cell: [phone] If no response, call [phone]

## 2019-01-10 NOTE — Progress Notes (Signed)
Troponin rising 155 -> 439 EKG without acute changes, sinus tachy with prolonged qtc (mg wnl) Likely related to arrest event earlier today Discussed with PCCM.  Will continue to monitor at this time.

## 2019-01-10 NOTE — Procedures (Addendum)
Chest Tube Insertion Procedure Note  Indications:  Clinically significant Pneumothorax  Pre-operative Diagnosis: Pneumothorax  Post-operative Diagnosis: Pneumothorax  Procedure Details  Informed consent was obtained for the procedure, including sedation.  Risks of lung perforation, hemorrhage, arrhythmia, and adverse drug reaction were discussed.   After sterile skin prep, using standard technique, a 28 French tube was placed in the left lateral 6th rib space.  Findings: Rush of air  Estimated Blood Loss:  Minimal         Specimens:  None              Complications:  None; patient tolerated the procedure well.         Disposition: ICU - intubated and critically ill.         Condition: stable  Attending Attestation: I performed the procedure.

## 2019-01-10 NOTE — Progress Notes (Signed)
[facility] PCCM  Called emergently to bedside for cardiac arrest Required 6 min CPR and epi x2 Discussed CXR findings with radiology, they say that he likely has a pneumomediastinum but cannot rule out bilateral occult pneumothoraces, particularly on the left as he has a new lucency in the left lung base.  Performed bedside chest ultrasound, anteriorly bilaterally there was sliding lung Performed bedside echocardiogram, could not get a good window to his heart for imaging due to body habitus.  Not helpful Discussed situation with my critical care partners at [facility].  The clinical scenario seems most consistent with a large tension pneumomediastinum.  Unfortunately there is not a good option to drain that.  Discussed situation with his wife Sunday Spillers at length and explained that we are performing all measures possible to keep him alive. She voiced understanding.  She asked that we act in our best judgement to care for him and consented for bilateral chest tubes, hoping they would somehow help decompress any pressure around his heart.  She requests that if I feel we have reached a point where further resuscitation is ineffective that I stop and not prolong his suffering. Two chest tubes placed.  c CXR pending Plan to infuse multiple vasoactive medications, check CBC, BMET, ABG now  Continue full support, though prognosis is poor, not clear if he can survive this.  Roselie Awkward, MD [place] [facility] Pager: [phone] Cell: [phone] If no response, call [phone]

## 2019-01-10 NOTE — Progress Notes (Signed)
PROGRESS NOTE    Arthur Daniel  [id number] DOB: 1940/10/10 DOA: 01/14/2019 PCP: Shelda Pal, DO   Brief Narrative:  78 year old male with history of hyperlipidemia, Graves' disease status post treatment now hypothyroid, pacemaker, who was admitted to the hospital on 01/12/2019 with acute hypoxic respiratory failure in the setting of COVID-19 viral illness.  He was treated with remdesivir, steroids, Actemra.  He was stable on the floor up until 12/31/2018 when he had an episode of atrial fibrillation with RVR, followed by worsening hypoxia as well as chest pain and required transfer to the ICU.  Assessment & Plan:   Principal Problem:   COVID-19 Active Problems:   Hypothyroidism   Essential hypertension   Cardiac pacemaker in situ   BPH (benign prostatic hyperplasia)   Anxiety   Acute respiratory failure with hypoxia (HCC)   Dyslipidemia   Pneumonia due to COVID-19 virus   Gastroenteritis due to COVID-19 virus   Thrombocytopenia (HCC)   Emphysema of lung (HCC)   Atrial fibrillation with RVR (HCC)   Hyperglycemia  Cardiac Arrest  Concern for tension pneumomediastinum: see PCCM note.  Coded and required 6 min CPR.  See PCCM note for additional details.  S/p bilateral chest tubes.     Hypotension  Shock: septic vs related to sedation vs cardiogenic. Continue levo/vaso VBG, echo, repeat lactate (downtrending 2.4) Vanc/cefepime, blood cx pending Appreciate PCCM   Acute Hypoxic Respiratory Failure due to Covid-19 Viral Illness  ARDS  Possible Pneumomediastinum - intubated 12/24, now on vent - PCCM following Vent Mode: PRVC FiO2 (%):  [100 %] 100 % Set Rate:  [32 bmp] 32 bmp Vt Set:  [560 mL] 560 mL PEEP:  [18 cmH20] 18 cmH20 Plateau Pressure:  [36 cmH20] 36 cmH20 - CXR 12/24 with gas in soft tissue of R supraclavicular region -> today soft tissue swelling with crepitus  -> repeat CXR with increasing supraclavicular soft tissue gas - now s/p bilateral chest  tubes - completed remdesivir - steroids completed 12/21 - received Actemra on 12/9 -Maintain negative fluid balance, currently is net -8.7.    COVID-19 Labs  Recent Labs    01/08/19 0635 01/08/19 1437 01/08/19 1438 01/09/19 0445  DDIMER 0.49  --  0.62* 0.32  FERRITIN  --  407*  --  347*  CRP  --  5.3*  --  4.2*    No results found for: SARSCOV2NAA  Acute Kidney Injury: follow, 2/2 hypotension and shock above.  Adequate UOP yesterday.   Wrenley Sayed. fib with RVR -Patient had previously Graves' disease, and had an episode of Ryka Beighley. fib in that setting.  When seen cardiology as an outpatient did not recommend anticoagulation given limited Ariyon Mittleman. fib.  He has had recurrent Mindel Friscia. fib with RVR during this hospitalization and will start on anticoagulation.  This was discussed with the patient by one of previous hospitalist.  He agrees, he has no bleeding history.  Continue therapeutic Lovenox, monitor H&H but clinically no evidence of bleeding.  Chadsvasc is 3. -His home metoprolol has been resumed -consider transition to doac -currently in sinus rhythm -lovenox held in setting of supraclavicular swelling, concern for possible hematoma (suspect less likely in setting of above) follow Hb, resume when able  Essential hypertension -Hold metop in setting of shock above  Hypothyroidism, history of Graves' disease -Continue Synthroid  Hyperlipidemia -Continue Zetia  Prediabetes  Steroid and Stress Induced Hyperglycemia:  Continue SSI, basal insulin adjust as needed  Elevated LFT's: likely related to COVID, follow.  Follow  acute hepatitis panel (negative).  Thrombocytopenia: follow in setting of acute illness  Polycythemia  Leukocytosis: WBC related to steroids, elevated Hb may be related to diuresis.  Follow.    DP pulse on R not dopplerable: dopplerable L PT and DP and R PT.  F/u with vascular if he survives recent arrest.   DVT prophylaxis: lovenox full dose - on hold Code Status: full   Family Communication: none at bedside - wife over phone 12/25 Disposition Plan: pending  Consultants:   none  Procedures:   none  Antimicrobials:  Anti-infectives (From admission, onward)   Start     Dose/Rate Route Frequency Ordered Stop   01/09/19 2000  vancomycin (VANCOREADY) IVPB 1750 mg/350 mL     1,750 mg 175 mL/hr over 120 Minutes Intravenous Every 24 hours 01/09/19 1814     01/09/19 1900  ceFEPIme (MAXIPIME) 2 g in sodium chloride 0.9 % 100 mL IVPB     2 g 200 mL/hr over 30 Minutes Intravenous Every 8 hours 01/09/19 1814     12/25/18 1000  remdesivir 100 mg in sodium chloride 0.9 % 100 mL IVPB  Status:  Discontinued     100 mg 200 mL/hr over 30 Minutes Intravenous Daily 01/14/2019 1625 12/20/2018 2257   12/25/18 1000  remdesivir 100 mg in sodium chloride 0.9 % 100 mL IVPB     100 mg 200 mL/hr over 30 Minutes Intravenous Daily 01/14/2019 2257 12/29/18 1216   01/06/2019 2330  remdesivir 200 mg in sodium chloride 0.9% 250 mL IVPB     200 mg 580 mL/hr over 30 Minutes Intravenous Once 01/04/2019 2257 12/25/18 0029   01/14/2019 1630  remdesivir 200 mg in sodium chloride 0.9% 250 mL IVPB  Status:  Discontinued     200 mg 580 mL/hr over 30 Minutes Intravenous Once 12/27/2018 1625 12/21/2018 2257     Subjective: Unresponsive on vent  Objective: Vitals:   01/10/19 0645 01/10/19 0700 01/10/19 0715 01/10/19 0730  BP:      Pulse: (!) 116 (!) 115 (!) 115 (!) 115  Resp: (!) 26 (!) 25 (!) 24 (!) 26  Temp:      TempSrc:      SpO2: 94% 95% 91% 91%  Weight:      Height:        Intake/Output Summary (Last 24 hours) at 01/10/2019 0948 Last data filed at 01/10/2019 0600 Gross per 24 hour  Intake 2920.69 ml  Output 1020 ml  Net 1900.69 ml   Filed Weights   01/07/19 0456 01/09/19 0500 01/10/19 0500  Weight: 116.6 kg 116.6 kg 119 kg    Examination:  General: No acute distress. R supraclavicular swelling with crepitus Cardiovascular: tachycardic Lungs: CTAB Abdomen: Soft,  nontender, nondistended  Neurological: sedated on vent Skin: Warm and dry. No rashes or lesions. Extremities: Mottled extremities, cool, pulses dopplerable except R DP (L DP and PT and R PT dopplered)    Data Reviewed: I have personally reviewed following labs and imaging studies  CBC: Recent Labs  Lab 01/06/19 0302 01/07/19 0259 01/08/19 0635 01/09/19 0445 01/09/19 1250 01/10/19 0851  WBC 14.8* 12.8* 13.3* 11.5*  --  12.7*  NEUTROABS 13.5* 11.6* 12.4* 10.3*  --  10.9*  HGB 19.7* 19.2* 19.0* 15.0 16.0 15.5  HCT 58.2* 56.3* 56.1* 45.1 47.0 49.2  MCV 94.5 92.9 93.0 94.0  --  100.4*  PLT 176 165 171 103*  --  [id number]   Basic Metabolic Panel: Recent Labs  Lab 01/06/19 0302 01/07/19 0259  01/08/19 1438 01/08/19 1900 01/09/19 0445 01/09/19 1250 01/09/19 1703 01/10/19 0550  NA 137 136 142 139 138 138  --   --   K 4.1 3.9 2.7* 4.0 4.7 4.0  --   --   CL 96* 96* 113* 97* 95*  --   --   --   CO2 24 27 22 29 30   --   --   --   GLUCOSE 145* 124* 171* 210* 150*  --   --   --   BUN 51* 46* 52* 68* 65*  --   --   --   CREATININE 1.25* 0.98 0.97 1.29* 1.19  --   --   --   CALCIUM 8.1* 8.1* 5.8* 8.8* 9.5  --   --   --   MG 2.7* 2.8* 2.1  --  3.1*  --  2.6* 2.6*  PHOS 4.3  --  3.6  --  5.2*  --  7.0* 6.4*   GFR: Estimated Creatinine Clearance: 69.3 mL/min (by C-G formula based on SCr of 1.19 mg/dL). Liver Function Tests: Recent Labs  Lab 01/05/19 0245 01/06/19 0302 01/07/19 0259 01/08/19 1438 01/09/19 0445  AST 38 41 42* 24 43*  ALT 98* 101* 105* 54* 88*  ALKPHOS 45 49 47 33* 72  BILITOT 0.9 1.0 1.4* 0.6 0.7  PROT 6.0* 6.0* 5.9* 3.7* 5.9*  ALBUMIN 3.1* 3.1* 3.0* 1.8* 2.8*   No results for input(s): LIPASE, AMYLASE in the last 168 hours. No results for input(s): AMMONIA in the last 168 hours. Coagulation Profile: No results for input(s): INR, PROTIME in the last 168 hours. Cardiac Enzymes: No results for input(s): CKTOTAL, CKMB, CKMBINDEX, TROPONINI in the last 168  hours. BNP (last 3 results) No results for input(s): PROBNP in the last 8760 hours. HbA1C: No results for input(s): HGBA1C in the last 72 hours. CBG: Recent Labs  Lab 01/09/19 1140 01/09/19 1753 01/10/19 0057 01/10/19 0325 01/10/19 0751  GLUCAP 168* 178* 137* 130* 174*   Lipid Profile: Recent Labs    01/10/19 0550  TRIG 307*   Thyroid Function Tests: No results for input(s): TSH, T4TOTAL, FREET4, T3FREE, THYROIDAB in the last 72 hours. Anemia Panel: Recent Labs    01/08/19 1437 01/09/19 0445  FERRITIN 407* 347*   Sepsis Labs: Recent Labs  Lab 01/06/19 0302 01/07/19 0259 01/09/19 1920  PROCALCITON 0.13 0.12  --   LATICACIDVEN  --   --  2.7*    Recent Results (from the past 240 hour(s))  MRSA PCR Screening     Status: None   Collection Time: 01/01/19  5:13 AM   Specimen: Nasal Mucosa; Nasopharyngeal  Result Value Ref Range Status   MRSA by PCR NEGATIVE NEGATIVE Final    Comment:        The GeneXpert MRSA Assay (FDA approved for NASAL specimens only), is one component of [facility] comprehensive MRSA colonization surveillance program. It is not intended to diagnose MRSA infection nor to guide or monitor treatment for MRSA infections. Performed at [facility], [place] [address], [place], [place] [address]          Radiology Studies: DG CHEST PORT 1 VIEW  Result Date: 01/09/2019 CLINICAL DATA:  Hypotension. COVID-19. EXAM: PORTABLE CHEST 1 VIEW 3:55 p.m. COMPARISON:  01/09/2019 11:25 Ritika Hellickson.m. FINDINGS: Endotracheal tube and central venous catheter appear in good position, unchanged. Feeding tube tip is below the diaphragm. Extensive bilateral pulmonary infiltrates are unchanged, worse on the left than the right. Aortic atherosclerosis. Note  is made of some gas in the soft tissues in the right supraclavicular region of unknown etiology, best seen on the earlier study on 01/09/2019. IMPRESSION: 1. No change in the extensive bilateral pulmonary  infiltrates. 2. Gas in the soft tissues in the right supraclavicular region of unknown etiology. 3.  Aortic Atherosclerosis (ICD10-I70.0). Electronically Signed   By: Lorriane Shire M.D.   On: 01/09/2019 16:36   DG CHEST PORT 1 VIEW  Result Date: 01/09/2019 CLINICAL DATA:  Hypoxia. Follow-up lung densities. Intubated. EXAM: PORTABLE CHEST 1 VIEW COMPARISON:  Earlier today. FINDINGS: Interval endotracheal tube in satisfactory position. Feeding tube extending into the stomach. Stable left subclavian bipolar pacemaker and leads. No significant change in diffuse prominence of the interstitial markings in both lungs and patchy airspace opacity in the left lower lung zone and mild patchy opacity at the right lung base. No visible pleural fluid. Unremarkable bones. IMPRESSION: 1. Interval endotracheal tube in satisfactory position. 2. Stable probable bilateral lower lung zone pneumonia, remaining greater on the left. 3. Stable diffuse bilateral interstitial pneumonitis. Electronically Signed   By: Claudie Revering M.D.   On: 01/09/2019 11:53   DG CHEST PORT 1 VIEW  Result Date: 01/09/2019 CLINICAL DATA:  Hypoxia. EXAM: PORTABLE CHEST 1 VIEW COMPARISON:  01/07/2019 FINDINGS: Left-sided pacemaker unchanged. Enteric tube courses into the region of the stomach as tip is not visualized. Lordotic technique is demonstrated. Lungs are somewhat hypoinflated with persistent stable moderate mixed interstitial airspace density throughout both lungs. There is interval worsening of consolidation over the left mid to lower lung. Remainder the exam is unchanged. IMPRESSION: 1. Interval worsening consolidation over the left mid to lower lung which may be due to infection versus effusion with atelectasis. 2. Stable diffuse bilateral mixed interstitial airspace density which may be due to edema or infection. 3. Enteric tube coursing into the region of the stomach with tip not visualized. Electronically Signed   By: Marin Olp M.D.    On: 01/09/2019 08:02   [place] EKG SITE RITE  Result Date: 01/09/2019 If Site Rite image not attached, placement could not be confirmed due to current cardiac rhythm.  [place] EKG SITE RITE  Result Date: 01/08/2019 If Site Rite image not attached, placement could not be confirmed due to current cardiac rhythm.       Scheduled Meds: . alfuzosin  10 mg Oral QHS  . vitamin C  500 mg Per Tube Daily  . chlorhexidine  15 mL Mouth Rinse BID  . Chlorhexidine Gluconate Cloth  6 each Topical Daily  . clonazePAM  2 mg Per Tube BID  . dextromethorphan  30 mg Per Tube BID  . ezetimibe  10 mg Per Tube Daily  . feeding supplement (PRO-STAT SUGAR FREE 64)  30 mL Per Tube TID  . guaiFENesin  15 mL Per Tube Q6H  . insulin aspart  0-15 Units Subcutaneous Q4H  . insulin detemir  11 Units Subcutaneous BID  . levothyroxine  175 mcg Per Tube QAC breakfast  . loratadine  10 mg Per Tube Daily  . mouth rinse  15 mL Mouth Rinse q12n4p  . mouth rinse  15 mL Mouth Rinse 10 times per day  . metoprolol tartrate  25 mg Per Tube BID  . mirabegron ER  50 mg Oral Daily  . oxyCODONE  5 mg Oral Q6H  . pantoprazole sodium  40 mg Per Tube Daily  . traZODone  50 mg Per Tube QHS  . zinc sulfate  220 mg Per  Tube Daily   Continuous Infusions: . sodium chloride 250 mL (12/26/18 0912)  . ceFEPime (MAXIPIME) IV Stopped (01/10/19 0309)  . feeding supplement (VITAL 1.5 CAL) 40 mL/hr at 01/10/19 0200  . fentaNYL infusion INTRAVENOUS 200 mcg/hr (01/10/19 0600)  . midazolam 10 mg/hr (01/10/19 0600)  . norepinephrine (LEVOPHED) Adult infusion 25 mcg/min (01/10/19 0627)  . vancomycin Stopped (01/09/19 2247)  . vasopressin (PITRESSIN) infusion - *FOR SHOCK* 0.03 Units/min (01/10/19 0600)     LOS: 17 days    Time spent: over 30 min  Fayrene Helper, MD [facility] Pager AMION  If 7PM-7AM, please contact night-coverage [email or web address] Password [facility] 01/10/2019, 9:48 AM

## 2019-01-10 NOTE — Procedures (Signed)
Chest Tube Insertion Procedure Note  Indications:  Clinically significant Pneumothorax  Pre-operative Diagnosis: Pneumothorax  Post-operative Diagnosis: Pneumothorax  Procedure Details  Informed consent was obtained for the procedure, including sedation.  Risks of lung perforation, hemorrhage, arrhythmia, and adverse drug reaction were discussed.   After sterile skin prep, using standard technique, a 28 French tube was placed in the right lateral 6th rib space.  Findings: No rush of air  Estimated Blood Loss:  Minimal         Specimens:  None              Complications:  None; patient tolerated the procedure well.         Disposition: ICU - intubated and critically ill.         Condition: stable  Attending Attestation: I performed the procedure.

## 2019-01-10 NOTE — Progress Notes (Signed)
Dr. Lake Bells notified of the ABG critical values.

## 2019-01-10 NOTE — Progress Notes (Signed)
Per CCM MD, decreased PEEP to 10 but pt spo2 remained steady at 63%, increased back to 14 per MD, ok for spo2 >75%. RN aware

## 2019-01-10 NOTE — Progress Notes (Signed)
ANTICOAGULATION CONSULT NOTE - Follow Up Consult  Pharmacy Consult for Lovenox Indication: atrial fibrillation/DVT prophylaxis  Allergies  Allergen Reactions  . Lipitor [Atorvastatin] Other (See Comments)    Breast lumps    Patient Measurements: Height: 5\' 11"  (180.3 cm) Weight: 262 lb 5.6 oz (119 kg) IBW/kg (Calculated) : 75.3  Vital Signs: BP: 120/54 (12/25 1607) Pulse Rate: 135 (12/25 1607)  Labs: Recent Labs    01/09/19 0445 01/10/19 0851 01/10/19 0949 01/10/19 1130 01/10/19 1219 01/10/19 1600 01/10/19 1609  HGB 15.0 15.5  --  15.0 14.9  --  14.6  HCT 45.1 49.2  --  44.0 48.2  --  43.0  PLT 103* 185  --   --  201  --   --   CREATININE 1.19  --  1.59*  --  1.57*  --   --   TROPONINIHS  --   --   --   --  155* 439*  --     Estimated Creatinine Clearance: 52.5 mL/min (A) (by C-G formula based on SCr of 1.57 mg/dL (H)).   Medical History: Past Medical History:  Diagnosis Date  . 10 year risk of MI or stroke 7.5% or greater 04/28/2016  . Allergic rhinitis, cause unspecified   . BPH (benign prostatic hyperplasia) 10/18/2015  . Diverticulosis   . ED (erectile dysfunction)   . Essential hypertension, benign   . Hernia, umbilical   . Lump or mass in breast   . Mixed hyperlipidemia   . Obesity   . Pacemaker    [place], [place] 2010  . Postablative hypothyroidism   . Sleep apnea    Use CPAP machine  . Ureteral stone    Dr. Risa Grill    Medications:  Scheduled:  . alfuzosin  10 mg Oral QHS  . artificial tears  1 application Both Eyes [id number]  . vitamin C  500 mg Per Tube Daily  . chlorhexidine  15 mL Mouth Rinse BID  . Chlorhexidine Gluconate Cloth  6 each Topical Daily  . clonazePAM  2 mg Per Tube BID  . dextromethorphan  30 mg Per Tube BID  . enoxaparin (LOVENOX) injection  60 mg Subcutaneous BID  . ezetimibe  10 mg Per Tube Daily  . feeding supplement (PRO-STAT SUGAR FREE 64)  30 mL Per Tube TID  . fentaNYL (SUBLIMAZE) injection  25 mcg Intravenous Once  .  guaiFENesin  15 mL Per Tube Q6H  . insulin aspart  0-15 Units Subcutaneous Q4H  . insulin detemir  11 Units Subcutaneous BID  . levothyroxine  175 mcg Per Tube QAC breakfast  . loratadine  10 mg Per Tube Daily  . mouth rinse  15 mL Mouth Rinse q12n4p  . mouth rinse  15 mL Mouth Rinse 10 times per day  . mirabegron ER  50 mg Oral Daily  . oxyCODONE  5 mg Oral Q6H  . pantoprazole sodium  40 mg Per Tube Daily  . traZODone  50 mg Per Tube QHS  . zinc sulfate  220 mg Per Tube Daily   Infusions:  . sodium chloride 250 mL (12/26/18 0912)  . ceFEPime (MAXIPIME) IV Stopped (01/10/19 1252)  . epinephrine 20 mcg/min (01/10/19 1706)  . feeding supplement (VITAL 1.5 CAL) 40 mL/hr at 01/10/19 0200  . fentaNYL infusion INTRAVENOUS 200 mcg/hr (01/10/19 1614)  . midazolam 10 mg/hr (01/10/19 1700)  . midazolam 10 mg/hr (01/10/19 1618)  . norepinephrine (LEVOPHED) Adult infusion 40 mcg/min (01/10/19 1700)  . sodium bicarbonate 150  mEq in dextrose 5% 1000 mL 100 mL/hr at 01/10/19 1700  . vancomycin    . vasopressin (PITRESSIN) infusion - *FOR SHOCK* 0.03 Units/min (01/10/19 1700)  . vecuronium (NORCURON) infusion 1 mcg/kg/min (01/10/19 1700)   PRN: sodium chloride, acetaminophen, ALPRAZolam, chlorpheniramine-HYDROcodone, fentaNYL, guaiFENesin-dextromethorphan, midazolam, nitroGLYCERIN, ondansetron (ZOFRAN) IV  Assessment: 78 yo male admitted 01/16/2019 with acute hypoxic respiratory failure in the setting of COVID-19 viral illness. CHADSVASc 3.   He was getting full dose lovenox for his afib. He had a recent cardiac arrest and lovenox was stopped. Dr. Florene Glen would like it to be resume at the ICU intermediate dose only for now.   Hgb wnl, scr 1.57>>CrCl 53  Goal of Therapy:  Monitor platelets by anticoagulation protocol: Yes   Plan:  Resume lovenox at 0.5mg /kg/dose (60mg ) SQ BID Monitor renal function, CBC, signs/symptoms of bleeding  Onnie Boer, PharmD, BCIDP, AAHIVP, CPP Infectious Disease  Pharmacist 01/10/2019 6:26 PM

## 2019-01-10 NOTE — Progress Notes (Signed)
Pharmacy Antibiotic Note  Arthur Daniel is a 78 y.o. male admitted on 12/20/2018 with pneumonia.  Pharmacy has been consulted for vanc/cefepime dosing.  Of note, patient's renal fx has worsened. SCr up to 1.57 today. Patient also had a code blue event today which could further worsen renal fx.    Plan: Decrease vancomycin to 1250 mg IV Q 24 hours  Calculated AUC 528 SCr used: 1.57 Continue Cefepime 2g IV q8 Monitor renal fx closely  F/u with level if needed  Height: 5\' 11"  (180.3 cm) Weight: 262 lb 5.6 oz (119 kg) IBW/kg (Calculated) : 75.3  Temp (24hrs), Avg:98.5 F (36.9 C), Min:97 F (36.1 C), Max:99.2 F (37.3 C)  Recent Labs  Lab 01/07/19 0259 01/08/19 0635 01/08/19 1438 01/08/19 1900 01/09/19 0445 01/09/19 1920 01/10/19 0851 01/10/19 0948 01/10/19 0949 01/10/19 1219  WBC 12.8* 13.3*  --   --  11.5*  --  12.7*  --   --  16.5*  CREATININE 0.98  --  0.97 1.29* 1.19  --   --   --  1.59* 1.57*  LATICACIDVEN  --   --   --   --   --  2.7*  --  2.4*  --   --     Estimated Creatinine Clearance: 52.5 mL/min (A) (by C-G formula based on SCr of 1.57 mg/dL (H)).    Allergies  Allergen Reactions  . Lipitor [Atorvastatin] Other (See Comments)    Breast lumps    Antimicrobials this admission: 12/24 vanc>> 12/24 cefepime>>  Dose adjustments this admission:   Microbiology results: 12/8 blood>>neg 12/16 MRSA PCR>>neg  Albertina Parr, PharmD., BCPS Clinical Pharmacist Clinical phone for 01/10/19 until 5pm: [phone]

## 2019-01-10 NOTE — Progress Notes (Signed)
  Echocardiogram 2D Echocardiogram has been performed.  Darlina Sicilian M 01/10/2019, 1:11 PM

## 2019-01-10 NOTE — Progress Notes (Signed)
Patient suffered PEA arrest.  ACLS protocol initiated immediately.  Sedation gtt's stopped.  Levophed increased to 40 mcg.  CPR for approximately 6 minutes before ROSC.  Pt noted to have increased subcutaneous air post CPR in his chest extending into his neck.  Wife called per Dr. Lake Bells and informed family of cardiac arrest, concern for possible pneumomediastinum / possible pneumothorax and need for chest tube insertion.  Wife is aware of critical nature of illness and that he may not survive events of the am.  She gives verbal consent over the phone for bilateral chest tubes.  Will continue to monitor and update family.     Noe Gens, MSN, NP-C [facility] 01/10/2019, 10:40 AM   Please see [email or web address] for pager details.

## 2019-01-11 ENCOUNTER — Inpatient Hospital Stay (HOSPITAL_COMMUNITY): Payer: Medicare HMO

## 2019-01-11 LAB — COMPREHENSIVE METABOLIC PANEL
ALT: 66 U/L — ABNORMAL HIGH (ref 0–44)
AST: 43 U/L — ABNORMAL HIGH (ref 15–41)
Albumin: 1.8 g/dL — ABNORMAL LOW (ref 3.5–5.0)
Alkaline Phosphatase: 48 U/L (ref 38–126)
Anion gap: 9 (ref 5–15)
BUN: 51 mg/dL — ABNORMAL HIGH (ref 8–23)
CO2: 29 mmol/L (ref 22–32)
Calcium: 6.5 mg/dL — ABNORMAL LOW (ref 8.9–10.3)
Chloride: 106 mmol/L (ref 98–111)
Creatinine, Ser: 1.25 mg/dL — ABNORMAL HIGH (ref 0.61–1.24)
GFR calc Af Amer: >60 mL/min (ref >60)
GFR calc non Af Amer: 56 mL/min — ABNORMAL LOW (ref >60)
Glucose, Bld: 411 mg/dL — ABNORMAL HIGH (ref 70–99)
Potassium: 3.6 mmol/L (ref 3.5–5.1)
Sodium: 144 mmol/L (ref 135–145)
Total Bilirubin: 0.7 mg/dL (ref 0.3–1.2)
Total Protein: 4.3 g/dL — ABNORMAL LOW (ref 6.5–8.1)

## 2019-01-11 LAB — GLUCOSE, CAPILLARY
Glucose-Capillary: 369 mg/dL — ABNORMAL HIGH (ref 70–99)
Glucose-Capillary: 366 mg/dL — ABNORMAL HIGH (ref 70–99)
Glucose-Capillary: 388 mg/dL — ABNORMAL HIGH (ref 70–99)
Glucose-Capillary: 280 mg/dL — ABNORMAL HIGH (ref 70–99)
Glucose-Capillary: 265 mg/dL — ABNORMAL HIGH (ref 70–99)
Glucose-Capillary: 203 mg/dL — ABNORMAL HIGH (ref 70–99)

## 2019-01-11 LAB — POCT I-STAT 7, (LYTES, BLD GAS, ICA,H+H)
Acid-Base Excess: 7 mmol/L — ABNORMAL HIGH (ref 0.0–2.0)
Bicarbonate: 35.4 mmol/L — ABNORMAL HIGH (ref 20.0–28.0)
Calcium, Ion: 1.05 mmol/L — ABNORMAL LOW (ref 1.15–1.40)
HCT: 34 % — ABNORMAL LOW (ref 39.0–52.0)
Hemoglobin: 11.6 g/dL — ABNORMAL LOW (ref 13.0–17.0)
O2 Saturation: 81 %
Potassium: 3.4 mmol/L — ABNORMAL LOW (ref 3.5–5.1)
Sodium: 149 mmol/L — ABNORMAL HIGH (ref 135–145)
TCO2: 38 mmol/L — ABNORMAL HIGH (ref 22–32)
pCO2 arterial: 71.3 mmHg (ref 32.0–48.0)
pH, Arterial: 7.304 — ABNORMAL LOW (ref 7.350–7.450)
pO2, Arterial: 52 mmHg — ABNORMAL LOW (ref 83.0–108.0)

## 2019-01-11 LAB — MAGNESIUM: Magnesium: 2.4 mg/dL (ref 1.7–2.4)

## 2019-01-11 LAB — D-DIMER, QUANTITATIVE: D-Dimer, Quant: 1.06 ug/mL-FEU — ABNORMAL HIGH (ref 0.00–0.50)

## 2019-01-11 LAB — PHOSPHORUS: Phosphorus: 2.9 mg/dL (ref 2.5–4.6)

## 2019-01-11 LAB — FERRITIN: Ferritin: 271 ng/mL (ref 24–336)

## 2019-01-11 LAB — C-REACTIVE PROTEIN: CRP: 13.6 mg/dL — ABNORMAL HIGH (ref <1.0)

## 2019-01-11 MED ORDER — EPINEPHRINE PF 1 MG/ML IJ SOLN
0.5000 ug/min | INTRAVENOUS | Status: DC
  Filled 2019-01-11: qty 8

## 2019-01-11 MED ORDER — HYDROCORTISONE NA SUCCINATE PF 100 MG IJ SOLR
50.0000 mg | Freq: Four times a day (QID) | INTRAMUSCULAR | Status: DC
  Administered 2019-01-11 – 2019-01-13 (×8): 50 mg via INTRAVENOUS
  Filled 2019-01-11 (×8): qty 2

## 2019-01-11 MED ORDER — EPINEPHRINE PF 1 MG/ML IJ SOLN: 0.5000 ug/min | INTRAVENOUS | Status: DC

## 2019-01-11 MED ORDER — INSULIN ASPART 100 UNIT/ML ~~LOC~~ SOLN
4.0000 [IU] | SUBCUTANEOUS | Status: DC
  Administered 2019-01-11 – 2019-01-13 (×11): 4 [IU] via SUBCUTANEOUS

## 2019-01-11 MED ORDER — INSULIN DETEMIR 100 UNIT/ML ~~LOC~~ SOLN: 14.0000 [IU] | Freq: Two times a day (BID) | SUBCUTANEOUS | Status: DC

## 2019-01-11 MED ORDER — INSULIN DETEMIR 100 UNIT/ML ~~LOC~~ SOLN
15.0000 [IU] | Freq: Two times a day (BID) | SUBCUTANEOUS | Status: DC
  Administered 2019-01-11 – 2019-01-13 (×6): 15 [IU] via SUBCUTANEOUS
  Filled 2019-01-11 (×6): qty 0.15

## 2019-01-11 MED ORDER — INSULIN ASPART 100 UNIT/ML ~~LOC~~ SOLN
0.0000 [IU] | SUBCUTANEOUS | Status: DC
  Administered 2019-01-11: 20:00:00 7 [IU] via SUBCUTANEOUS
  Administered 2019-01-11 (×2): 11 [IU] via SUBCUTANEOUS
  Administered 2019-01-12 (×2): 7 [IU] via SUBCUTANEOUS
  Administered 2019-01-12: 13:00:00 11 [IU] via SUBCUTANEOUS
  Administered 2019-01-12: 08:00:00 4 [IU] via SUBCUTANEOUS
  Administered 2019-01-13: 12:00:00 11 [IU] via SUBCUTANEOUS
  Administered 2019-01-13 (×2): 7 [IU] via SUBCUTANEOUS
  Administered 2019-01-13: 05:00:00 11 [IU] via SUBCUTANEOUS

## 2019-01-11 MED ORDER — EPINEPHRINE HCL 5 MG/250ML IV SOLN IN NS
0.5000 ug/min | INTRAVENOUS | Status: DC
  Administered 2019-01-11: 20 ug/min via INTRAVENOUS

## 2019-01-11 NOTE — Progress Notes (Signed)
NAME:  Arthur Daniel, MRN:  [id number], DOB:  Apr 26, 1940, LOS: 33 ADMISSION DATE:  01/05/2019, CONSULTATION DATE:  12/20 REFERRING MD:  Florene Glen, CHIEF COMPLAINT:  Dyspnea   Brief History   78 y/o male admitted on 12/8 for severe acute respiratory failure with hypoxemia due to COVID 19 pneumonia.   Past Medical History  Pacemaker in place Hypertension Hypothyroidism Hyperlipidemia BPH  Significant Hospital Events   12/19/2018 admitted 12/15 transfer to ICU  12/23 BIPAP per [facility] MD  Consults:  PCCM  Procedures:    Significant Diagnostic Tests:  12/13 CT chest images personally reviewed> emphysema upper lobes, bilateral ggo 12/25 echo> LVEF normal, underfilled LV, IVC dilated, RV also hyperdynamic  Micro Data:  12/8 sars cov 2 > positive 12/8 blood >   Antimicrobials:  12/8 remdesivir > 12/12 12/8 tocilizumab >   Interim history/subjective:   Remains on vasopressors Profoundly hypoxemic Making urine  Objective   Blood pressure (!) 122/36, pulse (!) 116, temperature 97.9 F (36.6 C), temperature source Oral, resp. rate (!) 35, height 5\' 11"  (1.803 m), weight 129.3 kg, SpO2 (!) 84 %. CVP:  [16 mmHg-18 mmHg] 17 mmHg  Vent Mode: PRVC FiO2 (%):  [100 %] 100 % Set Rate:  [35 bmp] 35 bmp Vt Set:  [560 mL] 560 mL PEEP:  [14 cmH20-18 cmH20] 14 cmH20 Plateau Pressure:  [38 cmH20] 38 cmH20   Intake/Output Summary (Last 24 hours) at 01/11/2019 1504 Last data filed at 01/11/2019 1000 Gross per 24 hour  Intake 5788.51 ml  Output 2250 ml  Net 3538.51 ml   Filed Weights   01/09/19 0500 01/10/19 0500 01/11/19 0500  Weight: 116.6 kg 119 kg 129.3 kg    Examination:  General:  In bed on vent HENT: NCAT ETT in place, neck less swollen PULM: Crackles bases B, vent supported breathing, bilateral chest tubes in place CV: RRR, no mgr GI: BS+, soft, nontender MSK: normal bulk and tone Neuro: sedated on vent  12/24 CXR > L>R airspace disease, likely left  effusion  Resolved Hospital Problem list     Assessment & Plan:  Severe acute respiratory Failure with hypoxemia due to COVID 19>severe Continue mechanical ventilation per ARDS protocol Target TVol 6-8cc/kgIBW Target Plateau Pressure < 30cm H20 Target driving pressure less than 15 cm of water Target PaO2 55-65: titrate PEEP/FiO2 per protocol As long as PaO2 to FiO2 ratio is less than 1:150 position in prone position for 16 hours a day Check CVP daily if CVL in place Target CVP less than 4, diurese as necessary Ventilator associated pneumonia prevention protocol 12/26 obtain CXR, obtain ABG, continue ARDS protocol, no prone positioning due to profound shock  Pneumomediastinum and possible bilateral pneumothoraces Continue chest tubes to suction Daily CXR  Shock, suspect due to pneumomediastinum Wean off epinephrine infusion, norepi, vaso for MAP > 65 Wean off epinephrine first   Need for sedation for mechanical ventilation Continue fentanyl/versed infusions per PAD protocol RASS target -2 Continue NMB protocol   Afib lovenox [employer], surprised he has survived overnight and labs better, continue current management. However if still this hypoxemic and in this degree of shock tomorrow then will need to move to comfort measures because likelihood of survival at that point would be slim at best.   Best practice:  Diet: start tube feeding Pain/Anxiety/Delirium protocol (if indicated): yes as abo ve VAP protocol (if indicated): yes DVT prophylaxis: full dose lovenox GI prophylaxis: ppi Glucose control: SSI Mobility: bed rest Code Status: full  Family Communication:per TRH Disposition: remain in ICU  Labs   CBC: Recent Labs  Lab 01/06/19 0302 01/07/19 0259 01/08/19 [id number] 01/09/19 0445 01/10/19 0851 01/10/19 1005 01/10/19 1130 01/10/19 1219 01/10/19 1609  WBC 14.8* 12.8* 13.3* 11.5* 12.7*  --   --  16.5*  --   NEUTROABS 13.5* 11.6* 12.4* 10.3* 10.9*  --   --    --   --   HGB 19.7* 19.2* 19.0* 15.0 15.5 15.3 15.0 14.9 14.6  HCT 58.2* 56.3* 56.1* 45.1 49.2 45.0 44.0 48.2 43.0  MCV 94.5 92.9 93.0 94.0 100.4*  --   --  102.6*  --   PLT 176 165 171 103* 185  --   --  201  --     Basic Metabolic Panel: Recent Labs  Lab 01/08/19 1438 01/08/19 1900 01/09/19 0445 01/09/19 1703 01/10/19 0550 01/10/19 0949 01/10/19 1005 01/10/19 1130 01/10/19 1219 01/10/19 1600 01/10/19 1609 01/11/19 0620  NA   < > 139 138  --   --  141 142 141 144  --  140 144  K   < > 4.0 4.7  --   --  4.9 4.6 4.0 4.4  --  4.4 3.6  CL  --  97* 95*  --   --  108  --   --  110  --   --  106  CO2  --  29 30  --   --  23  --   --  23  --   --  29  GLUCOSE  --  210* 150*  --   --  198*  --   --  301*  --   --  411*  BUN  --  68* 65*  --   --  71*  --   --  70*  --   --  51*  CREATININE  --  1.29* 1.19  --   --  1.59*  --   --  1.57*  --   --  1.25*  CALCIUM  --  8.8* 9.5  --   --  7.9*  --   --  7.6*  --   --  6.5*  MG  --   --  3.1* 2.6* 2.6*  --   --   --   --  2.7*  --  2.4  PHOS  --   --  5.2* 7.0* 6.4*  --   --   --   --  5.8*  --  2.9   < > = values in this interval not displayed.   GFR: Estimated Creatinine Clearance: 68.9 mL/min (A) (by C-G formula based on SCr of 1.25 mg/dL (H)). Recent Labs  Lab 01/06/19 0302 01/07/19 0259 01/08/19 [id number] 01/09/19 0445 01/09/19 1920 01/10/19 0851 01/10/19 0948 01/10/19 1219  PROCALCITON 0.13 0.12  --   --   --   --   --   --   WBC 14.8* 12.8* 13.3* 11.5*  --  12.7*  --  16.5*  LATICACIDVEN  --   --   --   --  2.7*  --  2.4*  --     Liver Function Tests: Recent Labs  Lab 01/07/19 0259 01/08/19 1438 01/09/19 0445 01/10/19 0949 01/11/19 0620  AST 42* 24 43* 36 43*  ALT 105* 54* 88* 76* 66*  ALKPHOS 47 33* 72 52 48  BILITOT 1.4* 0.6 0.7 1.4* 0.7  PROT 5.9* 3.7* 5.9* 5.8* 4.3*  ALBUMIN 3.0* 1.8* 2.8* 2.6*  1.8*   No results for input(s): LIPASE, AMYLASE in the last 168 hours. No results for input(s): AMMONIA in the  last 168 hours.  ABG    Component Value Date/Time   PHART 7.202 (L) 01/10/2019 1609   PCO2ART 60.7 (H) 01/10/2019 1609   PO2ART 52.0 (L) 01/10/2019 1609   HCO3 23.8 01/10/2019 1609   TCO2 26 01/10/2019 1609   ACIDBASEDEF 5.0 (H) 01/10/2019 1609   O2SAT 78.0 01/10/2019 1609     Coagulation Profile: No results for input(s): INR, PROTIME in the last 168 hours.  Cardiac Enzymes: No results for input(s): CKTOTAL, CKMB, CKMBINDEX, TROPONINI in the last 168 hours.  HbA1C: Hgb A1c MFr Bld  Date/Time Value Ref Range Status  11/25/2018 08:11 AM 6.2 4.6 - 6.5 % Final    Comment:    Glycemic Control Guidelines for People with Diabetes:Non Diabetic:  <6%Goal of Therapy: <7%Additional Action Suggested:  >8%   08/20/2018 01:08 PM 6.3 4.6 - 6.5 % Final    Comment:    Glycemic Control Guidelines for People with Diabetes:Non Diabetic:  <6%Goal of Therapy: <7%Additional Action Suggested:  >8%     CBG: Recent Labs  Lab 01/10/19 2158 01/11/19 0016 01/11/19 0607 01/11/19 0759 01/11/19 1158  GLUCAP 276* 369* 366* 388* 280*     Critical care time: 33 minutes     Roselie Awkward, MD [facility] Pager: [phone] Cell: [phone] If no response, call [phone]

## 2019-01-11 NOTE — Progress Notes (Signed)
Spoke to wife and daughter today and answered questions regarding patient and gave update.

## 2019-01-11 NOTE — Progress Notes (Addendum)
PROGRESS NOTE    Arthur Daniel  [id number] DOB: 08-11-1940 DOA: 01/11/2019 PCP: Shelda Pal, DO   Brief Narrative:  78 year old male with history of hyperlipidemia, Graves' disease status post treatment now hypothyroid, pacemaker, who was admitted to the hospital on 01/09/2019 with acute hypoxic respiratory failure in the setting of COVID-19 viral illness.  He was treated with remdesivir, steroids, Actemra.  He was stable on the floor up until 12/31/2018 when he had an episode of atrial fibrillation with RVR, followed by worsening hypoxia as well as chest pain and required transfer to the ICU.  Assessment & Plan:   Principal Problem:   COVID-19 Active Problems:   Hypothyroidism   Essential hypertension   Cardiac pacemaker in situ   BPH (benign prostatic hyperplasia)   Anxiety   Acute respiratory failure with hypoxia (HCC)   Dyslipidemia   Pneumonia due to COVID-19 virus   Gastroenteritis due to COVID-19 virus   Thrombocytopenia (HCC)   Emphysema of lung (HCC)   Atrial fibrillation with RVR (HCC)   Hyperglycemia  Cardiac Arrest  Concern for tension pneumomediastinum: see PCCM note.  Coded and required 6 min CPR.  See PCCM note for additional details.  S/p bilateral chest tubes.      Hypotension  Shock: septic vs related to sedation vs cardiogenic. Continue to require levo/vaso/epi - wean as tolerated for MAP goal > 65 Echo with EF 70-75%, hyperdynamic function, underfilled LV - dilated IVC with less than 50% resp variability (see report) - straight leg raise performed by nursing yesterday negative - hold additional IVF for now Vanc/cefepime empiric, no cultures Appreciate PCCM   Acute Hypoxic Respiratory Failure due to Covid-19 Viral Illness  ARDS  Possible Pneumomediastinum - intubated 12/24, now on vent - currently paralyzed, 100% FiO2 with 14 peep, satting in the 70's and low 80's - PCCM following Vent Mode: PRVC FiO2 (%):  [100 %] 100 % Set Rate:  [35  bmp] 35 bmp Vt Set:  [560 mL] 560 mL PEEP:  [14 cmH20-18 cmH20] 14 cmH20 Plateau Pressure:  [38 cmH20] 38 cmH20 - CXR 12/25 with bilateral pulm infiltrates, subcutaneous emphysema along L lateral chest wall - no pneumothorax after chest tube insertion - now s/p bilateral chest tubes - completed remdesivir - steroids completed 12/21 - received Actemra on 12/9 -Maintain negative fluid balance, currently is net -2.9.    COVID-19 Labs  Recent Labs    01/09/19 0445 01/10/19 0949 01/11/19 0620  DDIMER 0.32 0.51* 1.06*  FERRITIN 347* 329 271  CRP 4.2* 10.6* 13.6*    No results found for: SARSCOV2NAA  Respiratory Acidosis: improved, but still acidotic based on recent ABG yesterday, he's also on bicarb.  Follow repeat ABG today.  Acute Kidney Injury: peaked at 1.59.  creatinine has improved, follow.  Elzora Cullins. fib with RVR -Patient had previously Graves' disease, and had an episode of Rekha Hobbins. fib in that setting.  When seen cardiology as an outpatient did not recommend anticoagulation given limited Marifer Hurd. fib.  He has had recurrent Koralynn Greenspan. fib with RVR during this hospitalization and will start on anticoagulation.  This was discussed with the patient by one of previous hospitalist.  He agrees, he has no bleeding history.  Chadsvasc is 3. -His home metoprolol has been resumed -consider transition to doac -currently in sinus rhythm -currently on intermediate dose lovenox  Essential hypertension -Hold metop in setting of shock above  Hypothyroidism, history of Graves' disease -Continue Synthroid  Hyperlipidemia -Continue Zetia  Prediabetes  Steroid and  Stress Induced Hyperglycemia:  Continue SSI, basal insulin, scheduled tube feed coverage adjust as needed  Elevated LFT's: likely related to COVID, shock, follow.  Follow acute hepatitis panel (negative).  Thrombocytopenia: follow in setting of acute illness, improved, follow - pending today  Polycythemia  Leukocytosis: WBC related to steroids,  elevated Hb may be related to diuresis.  Pending today  DP pulse on R not dopplerable: dopplerable L PT and DP and R PT.  F/u with vascular if he survives recent arrest.   DVT prophylaxis: lovenox intermediate dose Code Status: full  Family Communication: none at bedside - wife over phone 12/26 - updated wife. Disposition Plan: pending  Consultants:   none  Procedures:   none  Antimicrobials:  Anti-infectives (From admission, onward)   Start     Dose/Rate Route Frequency Ordered Stop   01/10/19 2000  vancomycin (VANCOREADY) IVPB 1250 mg/250 mL     1,250 mg 166.7 mL/hr over 90 Minutes Intravenous Every 24 hours 01/10/19 1440     01/09/19 2000  vancomycin (VANCOREADY) IVPB 1750 mg/350 mL  Status:  Discontinued     1,750 mg 175 mL/hr over 120 Minutes Intravenous Every 24 hours 01/09/19 1814 01/10/19 1440   01/09/19 1900  ceFEPIme (MAXIPIME) 2 g in sodium chloride 0.9 % 100 mL IVPB     2 g 200 mL/hr over 30 Minutes Intravenous Every 8 hours 01/09/19 1814     12/25/18 1000  remdesivir 100 mg in sodium chloride 0.9 % 100 mL IVPB  Status:  Discontinued     100 mg 200 mL/hr over 30 Minutes Intravenous Daily 01/14/2019 1625 01/11/2019 2257   12/25/18 1000  remdesivir 100 mg in sodium chloride 0.9 % 100 mL IVPB     100 mg 200 mL/hr over 30 Minutes Intravenous Daily 12/27/2018 2257 12/29/18 1216   01/14/2019 2330  remdesivir 200 mg in sodium chloride 0.9% 250 mL IVPB     200 mg 580 mL/hr over 30 Minutes Intravenous Once 01/15/2019 2257 12/25/18 0029   01/08/2019 1630  remdesivir 200 mg in sodium chloride 0.9% 250 mL IVPB  Status:  Discontinued     200 mg 580 mL/hr over 30 Minutes Intravenous Once 12/28/2018 1625 12/26/2018 2257     Subjective: Unresponsive on vent  Objective: Vitals:   01/11/19 0700 01/11/19 0800 01/11/19 0900 01/11/19 1000  BP:      Pulse: (!) 131 (!) 130 (!) 129 (!) 127  Resp: (!) 35 (!) 35 (!) 35 (!) 35  Temp:  98.9 F (37.2 C)    TempSrc:  Oral    SpO2: (!) 77% (!)  81% (!) 82% (!) 81%  Weight:      Height:        Intake/Output Summary (Last 24 hours) at 01/11/2019 1131 Last data filed at 01/11/2019 1000 Gross per 24 hour  Intake 6578.05 ml  Output 2550 ml  Net 4028.05 ml   Filed Weights   01/09/19 0500 01/10/19 0500 01/11/19 0500  Weight: 116.6 kg 119 kg 129.3 kg    Examination:  General: unresponsive on vent Cardiovascular: tachycardic Lungs: tachypneic on vent, no clear adventitious lung sounds Abdomen: Soft, nontender, nondistended Neurological: unresponsive on vent, sedated and paralyzed Skin: Warm and dry. No rashes or lesions. Extremities: cool extremities, unable to palpate distal pulses    Data Reviewed: I have personally reviewed following labs and imaging studies  CBC: Recent Labs  Lab 01/06/19 0302 01/07/19 0259 01/08/19 0635 01/09/19 0445 01/10/19 0851 01/10/19 1005 01/10/19 1130 01/10/19  1219 01/10/19 1609  WBC 14.8* 12.8* 13.3* 11.5* 12.7*  --   --  16.5*  --   NEUTROABS 13.5* 11.6* 12.4* 10.3* 10.9*  --   --   --   --   HGB 19.7* 19.2* 19.0* 15.0 15.5 15.3 15.0 14.9 14.6  HCT 58.2* 56.3* 56.1* 45.1 49.2 45.0 44.0 48.2 43.0  MCV 94.5 92.9 93.0 94.0 100.4*  --   --  102.6*  --   PLT 176 165 171 103* 185  --   --  201  --    Basic Metabolic Panel: Recent Labs  Lab 01/08/19 1438 01/08/19 1900 01/09/19 0445 01/09/19 1703 01/10/19 0550 01/10/19 0949 01/10/19 1005 01/10/19 1130 01/10/19 1219 01/10/19 1600 01/10/19 1609 01/11/19 0620  NA   < > 139 138  --   --  141 142 141 144  --  140 144  K   < > 4.0 4.7  --   --  4.9 4.6 4.0 4.4  --  4.4 3.6  CL  --  97* 95*  --   --  108  --   --  110  --   --  106  CO2  --  29 30  --   --  23  --   --  23  --   --  29  GLUCOSE  --  210* 150*  --   --  198*  --   --  301*  --   --  411*  BUN  --  68* 65*  --   --  71*  --   --  70*  --   --  51*  CREATININE  --  1.29* 1.19  --   --  1.59*  --   --  1.57*  --   --  1.25*  CALCIUM  --  8.8* 9.5  --   --  7.9*  --    --  7.6*  --   --  6.5*  MG  --   --  3.1* 2.6* 2.6*  --   --   --   --  2.7*  --  2.4  PHOS  --   --  5.2* 7.0* 6.4*  --   --   --   --  5.8*  --  2.9   < > = values in this interval not displayed.   GFR: Estimated Creatinine Clearance: 68.9 mL/min (Haedyn Ancrum) (by C-G formula based on SCr of 1.25 mg/dL (H)). Liver Function Tests: Recent Labs  Lab 01/07/19 0259 01/08/19 1438 01/09/19 0445 01/10/19 0949 01/11/19 0620  AST 42* 24 43* 36 43*  ALT 105* 54* 88* 76* 66*  ALKPHOS 47 33* 72 52 48  BILITOT 1.4* 0.6 0.7 1.4* 0.7  PROT 5.9* 3.7* 5.9* 5.8* 4.3*  ALBUMIN 3.0* 1.8* 2.8* 2.6* 1.8*   No results for input(s): LIPASE, AMYLASE in the last 168 hours. No results for input(s): AMMONIA in the last 168 hours. Coagulation Profile: No results for input(s): INR, PROTIME in the last 168 hours. Cardiac Enzymes: No results for input(s): CKTOTAL, CKMB, CKMBINDEX, TROPONINI in the last 168 hours. BNP (last 3 results) No results for input(s): PROBNP in the last 8760 hours. HbA1C: No results for input(s): HGBA1C in the last 72 hours. CBG: Recent Labs  Lab 01/10/19 1622 01/10/19 2158 01/11/19 0016 01/11/19 0607 01/11/19 0759  GLUCAP 354* 276* 369* 366* 388*   Lipid Profile: Recent Labs    01/10/19 0550  TRIG 307*  Thyroid Function Tests: No results for input(s): TSH, T4TOTAL, FREET4, T3FREE, THYROIDAB in the last 72 hours. Anemia Panel: Recent Labs    01/10/19 0949 01/11/19 0620  FERRITIN 329 271   Sepsis Labs: Recent Labs  Lab 01/06/19 0302 01/07/19 0259 01/09/19 1920 01/10/19 0948  PROCALCITON 0.13 0.12  --   --   LATICACIDVEN  --   --  2.7* 2.4*    No results found for this or any previous visit (from the past 240 hour(s)).       Radiology Studies: DG Chest 1 View  Result Date: 01/10/2019 CLINICAL DATA:  Status post bilateral chest tube placement. COVID pneumonia. Pneumomediastinum. EXAM: CHEST  1 VIEW 10:56 Alyzza Andringa.m. COMPARISON:  Chest x-ray dated 01/10/2019 at  9:37 Braylon Lemmons.m. FINDINGS: Bilateral chest tubes have been inserted. No visible pneumothorax. New subcutaneous emphysema along the left lateral chest wall. Increased subcutaneous emphysema on the right supraclavicular region. Endotracheal tube, feeding tube and right subclavian catheter appear in good position, unchanged. Heart size and vascularity are normal. Persistent bilateral pulmonary infiltrates, slightly improved at the bases. Pacemaker in place. IMPRESSION: 1. No pneumothorax after chest tube insertion. 2. New subcutaneous emphysema along the left lateral chest wall. 3. Persistent bilateral pulmonary infiltrates, slightly improved at the bases. Electronically Signed   By: Lorriane Shire M.D.   On: 01/10/2019 11:28   DG CHEST PORT 1 VIEW  Addendum Date: 01/10/2019   ADDENDUM REPORT: 01/10/2019 10:32 ADDENDUM: Study discussed by telephone with Dr. Lake Bells at [facility] on 01/10/2019 at 1013 hours. He advised the patient underwent recent cardiopulmonary arrest and resuscitation. But Jock Mahon small amount of right neck supraclavicular gas was present yesterday. While we are speaking he examined the patient's anterior chest with bedside Ultrasound, and did not find evidence of pneumothorax. We discussed surveillance with portable chest x-ray considering the patient may be too unstable for chest CT at this time. Electronically Signed   By: Genevie Ann M.D.   On: 01/10/2019 10:32   Result Date: 01/10/2019 CLINICAL DATA:  78 year old male COVID-19. EXAM: PORTABLE CHEST 1 VIEW COMPARISON:  Portable chest 01/09/2019 and earlier. FINDINGS: Portable AP semi upright view at 0937 hours. The patient is more rotated to the left. Increased and now moderate supraclavicular gas is visible on the right. Questionable pneumomediastinum. No pneumothorax is identified. Endotracheal tube tip remains in good position between the level the clavicles and carina. Enteric feeding tube courses to the abdomen, tip not included. Stable right subclavian  central line. Stable left chest pacemaker. Diffuse coarse bilateral pulmonary opacity persists. Left lung base ventilation has improved since yesterday, and lung volumes are mildly larger. No pleural effusion. IMPRESSION: 1. Increasing supraclavicular soft tissue gas might be originating from barotrauma related pneumomediastinum, but raises the possibility of Occult Pneumothorax. 2. Severe bilateral COVID-19 pneumonia. Ventilation has improved at the left lung base since yesterday. Electronically Signed: By: Genevie Ann M.D. On: 01/10/2019 10:07   DG CHEST PORT 1 VIEW  Result Date: 01/09/2019 CLINICAL DATA:  Hypotension. COVID-19. EXAM: PORTABLE CHEST 1 VIEW 3:55 p.m. COMPARISON:  01/09/2019 11:25 Jacobi Ryant.m. FINDINGS: Endotracheal tube and central venous catheter appear in good position, unchanged. Feeding tube tip is below the diaphragm. Extensive bilateral pulmonary infiltrates are unchanged, worse on the left than the right. Aortic atherosclerosis. Note is made of some gas in the soft tissues in the right supraclavicular region of unknown etiology, best seen on the earlier study on 01/09/2019. IMPRESSION: 1. No change in the extensive bilateral pulmonary infiltrates. 2. Gas in the  soft tissues in the right supraclavicular region of unknown etiology. 3.  Aortic Atherosclerosis (ICD10-I70.0). Electronically Signed   By: Lorriane Shire M.D.   On: 01/09/2019 16:36   DG CHEST PORT 1 VIEW  Result Date: 01/09/2019 CLINICAL DATA:  Hypoxia. Follow-up lung densities. Intubated. EXAM: PORTABLE CHEST 1 VIEW COMPARISON:  Earlier today. FINDINGS: Interval endotracheal tube in satisfactory position. Feeding tube extending into the stomach. Stable left subclavian bipolar pacemaker and leads. No significant change in diffuse prominence of the interstitial markings in both lungs and patchy airspace opacity in the left lower lung zone and mild patchy opacity at the right lung base. No visible pleural fluid. Unremarkable bones.  IMPRESSION: 1. Interval endotracheal tube in satisfactory position. 2. Stable probable bilateral lower lung zone pneumonia, remaining greater on the left. 3. Stable diffuse bilateral interstitial pneumonitis. Electronically Signed   By: Claudie Revering M.D.   On: 01/09/2019 11:53   ECHOCARDIOGRAM COMPLETE  Result Date: 01/10/2019   ECHOCARDIOGRAM REPORT   Patient Name:   ZYLAN MARINARO Date of Exam: 01/10/2019 Medical Rec #:  [id number]       Height:       71.0 in Accession #:    [id number]      Weight:       262.3 lb Date of Birth:  10-24-1940       BSA:          2.37 m Patient Age:    56 years        BP:           115/67 mmHg Patient Gender: M               HR:           133 bpm. Exam Location:  Inpatient Procedure: 2D Echo Indications:    Dyspnea 786.09 / R06.00  History:        Patient has prior history of Echocardiogram examinations, most                 recent 05/14/2008. Pacemaker; Risk Factors:Hypertension and                 Dyslipidemia. Covid 19. Severe acute respiratory Failure with                 hypoxemia . Septic shock.  Sonographer:    Darlina Sicilian RDCS Referring Phys: [place]  1. Left ventricular ejection fraction, by visual estimation, is 70 to 75%. The left ventricle has hyperdynamic function. Left ventricular septal wall thickness was normal. There is severely increased left ventricular hypertrophy. The LV appears underfilled.  2. The left ventricle has no regional wall motion abnormalities.  3. Global right ventricle has hyperdynamic systolic function.The right ventricular size is normal. No increase in right ventricular wall thickness.  4. Left atrial size was normal.  5. Right atrial size was normal.  6. The mitral valve is grossly normal. No evidence of mitral valve regurgitation.  7. The tricuspid valve is grossly normal.  8. The aortic valve is tricuspid. Aortic valve regurgitation is not visualized.  9. The pulmonic valve was grossly normal. Pulmonic valve  regurgitation is trivial. 10. Aortic root could not be assessed. 11. Mildly elevated pulmonary artery systolic pressure. 12. Merric Yost venous catheter is visualized. 13. The inferior vena cava is dilated in size with <50% respiratory variability, suggesting right atrial pressure of 15 mmHg. 14. The interatrial septum was not assessed. FINDINGS  Left Ventricle: Left ventricular  ejection fraction, by visual estimation, is 70 to 75%. The left ventricle has hyperdynamic function. The left ventricle has no regional wall motion abnormalities. There is severely increased left ventricular hypertrophy. Right Ventricle: The right ventricular size is normal. No increase in right ventricular wall thickness. Global RV systolic function is has hyperdynamic systolic function. The tricuspid regurgitant velocity is 2.47 m/s, and with an assumed right atrial pressure of 15 mmHg, the estimated right ventricular systolic pressure is mildly elevated at 39.4 mmHg. Left Atrium: Left atrial size was normal in size. Right Atrium: Right atrial size was normal in size Pericardium: There is no evidence of pericardial effusion. Mitral Valve: The mitral valve is grossly normal. No evidence of mitral valve regurgitation. Tricuspid Valve: The tricuspid valve is grossly normal. Tricuspid valve regurgitation is trivial. Aortic Valve: The aortic valve is tricuspid. Aortic valve regurgitation is not visualized. Pulmonic Valve: The pulmonic valve was grossly normal. Pulmonic valve regurgitation is trivial. Pulmonic regurgitation is trivial. Aorta: Aortic root could not be assessed. Venous: The inferior vena cava is dilated in size with less than 50% respiratory variability, suggesting right atrial pressure of 15 mmHg. IAS/Shunts: The interatrial septum was not assessed. Additional Comments: Pihu Basil venous catheter is visualized.  LEFT VENTRICLE PLAX 2D LVIDd:         2.65 cm LVIDs:         1.42 cm LV PW:         2.02 cm LV IVS:        1.99 cm LV SV:         21 ml LV  SV Index:   8.27  TRICUSPID VALVE TR Peak grad:   24.4 mmHg TR Vmax:        247.00 cm/s  Glori Bickers MD Electronically signed by Glori Bickers MD Signature Date/Time: 01/10/2019/3:19:55 PM    Final         Scheduled Meds: . alfuzosin  10 mg Oral QHS  . artificial tears  1 application Both Eyes [id number]  . vitamin C  500 mg Per Tube Daily  . chlorhexidine  15 mL Mouth Rinse BID  . Chlorhexidine Gluconate Cloth  6 each Topical Daily  . clonazePAM  2 mg Per Tube BID  . dextromethorphan  30 mg Per Tube BID  . enoxaparin (LOVENOX) injection  60 mg Subcutaneous BID  . ezetimibe  10 mg Per Tube Daily  . feeding supplement (PRO-STAT SUGAR FREE 64)  30 mL Per Tube TID  . fentaNYL (SUBLIMAZE) injection  25 mcg Intravenous Once  . guaiFENesin  15 mL Per Tube Q6H  . insulin aspart  0-15 Units Subcutaneous Q4H  . insulin detemir  15 Units Subcutaneous BID  . levothyroxine  175 mcg Per Tube QAC breakfast  . loratadine  10 mg Per Tube Daily  . mouth rinse  15 mL Mouth Rinse q12n4p  . mouth rinse  15 mL Mouth Rinse 10 times per day  . mirabegron ER  50 mg Oral Daily  . oxyCODONE  5 mg Oral Q6H  . pantoprazole sodium  40 mg Per Tube Daily  . traZODone  50 mg Per Tube QHS  . zinc sulfate  220 mg Per Tube Daily   Continuous Infusions: . sodium chloride 250 mL (12/26/18 0912)  . ceFEPime (MAXIPIME) IV Stopped (01/11/19 0954)  . epinephrine 20 mcg/min (01/11/19 1000)  . feeding supplement (VITAL 1.5 CAL) 40 mL/hr at 01/11/19 0800  . fentaNYL infusion INTRAVENOUS 200 mcg/hr (01/11/19 1000)  . midazolam  10 mg/hr (01/11/19 1000)  . midazolam 10 mg/hr (01/10/19 1618)  . norepinephrine (LEVOPHED) Adult infusion 40 mcg/min (01/11/19 1000)  . sodium bicarbonate 150 mEq in dextrose 5% 1000 mL 100 mL/hr at 01/11/19 1000  . vancomycin Stopped (01/10/19 2340)  . vasopressin (PITRESSIN) infusion - *FOR SHOCK* 0.03 Units/min (01/10/19 2000)  . vecuronium (NORCURON) infusion 1 mcg/kg/min (01/11/19 1000)      LOS: 18 days    Time spent: 45 min critical care time due to AHRF, shock  Fayrene Helper, MD [facility] Pager AMION  If 7PM-7AM, please contact night-coverage [email or web address] Password [facility] 01/11/2019, 11:31 AM

## 2019-01-12 ENCOUNTER — Inpatient Hospital Stay (HOSPITAL_COMMUNITY): Payer: Medicare HMO

## 2019-01-12 DIAGNOSIS — J939 Pneumothorax, unspecified: Secondary | ICD-10-CM

## 2019-01-12 DIAGNOSIS — I9589 Other hypotension: Secondary | ICD-10-CM

## 2019-01-12 DIAGNOSIS — J438 Other emphysema: Secondary | ICD-10-CM

## 2019-01-12 DIAGNOSIS — T797XXA Traumatic subcutaneous emphysema, initial encounter: Secondary | ICD-10-CM

## 2019-01-12 LAB — COMPREHENSIVE METABOLIC PANEL
ALT: 58 U/L — ABNORMAL HIGH (ref 0–44)
AST: 59 U/L — ABNORMAL HIGH (ref 15–41)
Albumin: 1.9 g/dL — ABNORMAL LOW (ref 3.5–5.0)
Alkaline Phosphatase: 41 U/L (ref 38–126)
Anion gap: 7 (ref 5–15)
BUN: 48 mg/dL — ABNORMAL HIGH (ref 8–23)
CO2: 36 mmol/L — ABNORMAL HIGH (ref 22–32)
Calcium: 6.8 mg/dL — ABNORMAL LOW (ref 8.9–10.3)
Chloride: 104 mmol/L (ref 98–111)
Creatinine, Ser: 0.91 mg/dL (ref 0.61–1.24)
GFR calc Af Amer: >60 mL/min (ref >60)
GFR calc non Af Amer: >60 mL/min (ref >60)
Glucose, Bld: 250 mg/dL — ABNORMAL HIGH (ref 70–99)
Potassium: 4.6 mmol/L (ref 3.5–5.1)
Sodium: 147 mmol/L — ABNORMAL HIGH (ref 135–145)
Total Bilirubin: 0.7 mg/dL (ref 0.3–1.2)
Total Protein: 4.9 g/dL — ABNORMAL LOW (ref 6.5–8.1)

## 2019-01-12 LAB — POCT I-STAT 7, (LYTES, BLD GAS, ICA,H+H)
Acid-Base Excess: 8 mmol/L — ABNORMAL HIGH (ref 0.0–2.0)
Bicarbonate: 36.8 mmol/L — ABNORMAL HIGH (ref 20.0–28.0)
Calcium, Ion: 1.01 mmol/L — ABNORMAL LOW (ref 1.15–1.40)
HCT: 30 % — ABNORMAL LOW (ref 39.0–52.0)
Hemoglobin: 10.2 g/dL — ABNORMAL LOW (ref 13.0–17.0)
O2 Saturation: 72 %
Patient temperature: 98.8
Potassium: 4.1 mmol/L (ref 3.5–5.1)
Sodium: 147 mmol/L — ABNORMAL HIGH (ref 135–145)
TCO2: 39 mmol/L — ABNORMAL HIGH (ref 22–32)
pCO2 arterial: 77 mmHg (ref 32.0–48.0)
pH, Arterial: 7.287 — ABNORMAL LOW (ref 7.350–7.450)
pO2, Arterial: 45 mmHg — ABNORMAL LOW (ref 83.0–108.0)

## 2019-01-12 LAB — CBC WITH DIFFERENTIAL/PLATELET
Abs Immature Granulocytes: 0.06 10*3/uL (ref 0.00–0.07)
Basophils Absolute: 0 10*3/uL (ref 0.0–0.1)
Basophils Relative: 0 %
Eosinophils Absolute: 0 10*3/uL (ref 0.0–0.5)
Eosinophils Relative: 0 %
HCT: 37.9 % — ABNORMAL LOW (ref 39.0–52.0)
Hemoglobin: 11.5 g/dL — ABNORMAL LOW (ref 13.0–17.0)
Immature Granulocytes: 2 %
Lymphocytes Relative: 6 %
Lymphs Abs: 0.3 10*3/uL — ABNORMAL LOW (ref 0.7–4.0)
MCH: 32 pg (ref 26.0–34.0)
MCHC: 30.3 g/dL (ref 30.0–36.0)
MCV: 105.6 fL — ABNORMAL HIGH (ref 80.0–100.0)
Monocytes Absolute: 0.4 10*3/uL (ref 0.1–1.0)
Monocytes Relative: 10 %
Neutro Abs: 3.4 10*3/uL (ref 1.7–7.7)
Neutrophils Relative %: 82 %
Platelets: 84 10*3/uL — ABNORMAL LOW (ref 150–400)
RBC: 3.59 MIL/uL — ABNORMAL LOW (ref 4.22–5.81)
RDW: 14.3 % (ref 11.5–15.5)
WBC: 4.1 10*3/uL (ref 4.0–10.5)
nRBC: 0.7 % — ABNORMAL HIGH (ref 0.0–0.2)

## 2019-01-12 LAB — GLUCOSE, CAPILLARY
Glucose-Capillary: 72 mg/dL (ref 70–99)
Glucose-Capillary: 152 mg/dL — ABNORMAL HIGH (ref 70–99)
Glucose-Capillary: 163 mg/dL — ABNORMAL HIGH (ref 70–99)
Glucose-Capillary: 258 mg/dL — ABNORMAL HIGH (ref 70–99)
Glucose-Capillary: 239 mg/dL — ABNORMAL HIGH (ref 70–99)
Glucose-Capillary: 236 mg/dL — ABNORMAL HIGH (ref 70–99)

## 2019-01-12 LAB — PHOSPHORUS: Phosphorus: 2.3 mg/dL — ABNORMAL LOW (ref 2.5–4.6)

## 2019-01-12 LAB — MAGNESIUM: Magnesium: 2.7 mg/dL — ABNORMAL HIGH (ref 1.7–2.4)

## 2019-01-12 LAB — D-DIMER, QUANTITATIVE: D-Dimer, Quant: 0.79 ug/mL-FEU — ABNORMAL HIGH (ref 0.00–0.50)

## 2019-01-12 LAB — FERRITIN: Ferritin: 282 ng/mL (ref 24–336)

## 2019-01-12 LAB — C-REACTIVE PROTEIN: CRP: 17.1 mg/dL — ABNORMAL HIGH (ref <1.0)

## 2019-01-12 MED ORDER — SENNOSIDES 8.8 MG/5ML PO SYRP
5.0000 mL | ORAL_SOLUTION | Freq: Two times a day (BID) | ORAL | Status: DC
  Administered 2019-01-12 – 2019-01-13 (×3): 5 mL
  Filled 2019-01-12 (×3): qty 5

## 2019-01-12 MED ORDER — DOCUSATE SODIUM 50 MG/5ML PO LIQD
100.0000 mg | Freq: Every day | ORAL | Status: DC
  Administered 2019-01-12 – 2019-01-13 (×2): 100 mg
  Filled 2019-01-12 (×2): qty 10

## 2019-01-12 MED ORDER — VANCOMYCIN HCL 1500 MG/300ML IV SOLN
1500.0000 mg | Freq: Two times a day (BID) | INTRAVENOUS | Status: DC
  Administered 2019-01-12 – 2019-01-13 (×2): 1500 mg via INTRAVENOUS
  Filled 2019-01-12 (×3): qty 300

## 2019-01-12 MED ORDER — ROCURONIUM BROMIDE 10 MG/ML (PF) SYRINGE: 130.0000 mg | PREFILLED_SYRINGE | INTRAVENOUS | Status: DC | PRN

## 2019-01-12 NOTE — Progress Notes (Signed)
[facility] PCCM  I Spoke to his wife Sunday Spillers today at length about goals of care.  She says that she and her husband "Arthur Daniel" have an advanced directive in place that he does not want to be dependent on machines for the rest of his life.  He also doesn't want to be a burden on anyone.  She did not say that he had given her clear directives on prolonged ICU care or nursing home stay after a hospitalization.  She says that she does not believe his children would want to participate in this discussion.  She says that she is unsure what to do at this point.  I explained that I am concerned that after his cardiac arrest and prolonged period of shock and hypoxemia that his risk of anoxic brain injury is very high and that he will need prolonged care after this even if he makes it (slim chance).  She voiced understanding and said that she wants to discuss the situation with his brother and then get back with [place].  I explained that I would consult palliative care to help and she said that she would appreciate that.  We decided not to escalate care further from this point (don't turn up the vent or pressors)  Roselie Awkward, MD [place] [facility] Pager: [phone] Cell: [phone] If no response, call [phone]

## 2019-01-12 NOTE — Progress Notes (Signed)
Pharmacy Antibiotic Note  Arthur Daniel is a 78 y.o. male admitted on 01/02/2019 with pneumonia.  Pharmacy has been consulted for vanc/cefepime dosing.  Of note, patient's renal fx has normalized. WBC wnl. Today is D4 or abx therapy.   Plan: Increase vancomycin to 1500 mg IV Q 12 hours  Calculated AUC 517 SCr used: 0.91 Continue Cefepime 2g IV q8 Monitor renal fx closely  F/u with level if needed  Height: 5\' 11"  (180.3 cm) Weight: 294 lb 5 oz (133.5 kg) IBW/kg (Calculated) : 75.3  Temp (24hrs), Avg:98.7 F (37.1 C), Min:97.9 F (36.6 C), Max:99.9 F (37.7 C)  Recent Labs  Lab 01/08/19 0635 01/09/19 0445 01/09/19 1920 01/10/19 0851 01/10/19 0948 01/10/19 0949 01/10/19 1219 01/11/19 0620 01/12/19 0830  WBC 13.3* 11.5*  --  12.7*  --   --  16.5*  --  4.1  CREATININE  --  1.19  --   --   --  1.59* 1.57* 1.25* 0.91  LATICACIDVEN  --   --  2.7*  --  2.4*  --   --   --   --     Estimated Creatinine Clearance: 96.3 mL/min (by C-G formula based on SCr of 0.91 mg/dL).    Allergies  Allergen Reactions  . Lipitor [Atorvastatin] Other (See Comments)    Breast lumps    Antimicrobials this admission: 12/24 vanc>> 12/24 cefepime>>  Dose adjustments this admission:   Microbiology results: 12/8 blood>>neg 12/16 MRSA PCR>>neg  Albertina Parr, PharmD., BCPS Clinical Pharmacist Clinical phone for 01/12/19 until 5pm: [phone]

## 2019-01-12 NOTE — Progress Notes (Signed)
NAME:  Arthur Daniel, MRN:  [id number], DOB:  [birth year], LOS: 62 ADMISSION DATE:  01/10/2019, CONSULTATION DATE:  12/20 REFERRING MD:  Florene Glen, CHIEF COMPLAINT:  Dyspnea   Brief History   78 y/o male admitted on 12/8 for severe acute respiratory failure with hypoxemia due to COVID 19 pneumonia.   Past Medical History  Pacemaker in place Hypertension Hypothyroidism Hyperlipidemia BPH  Significant Hospital Events   12/26/2018 admitted 12/15 transfer to ICU  12/23 BIPAP per [facility] MD  Consults:  PCCM  Procedures:    Significant Diagnostic Tests:  12/13 CT chest images personally reviewed> emphysema upper lobes, bilateral ggo 12/25 echo> LVEF normal, underfilled LV, IVC dilated, RV also hyperdynamic  Micro Data:  12/8 sars cov 2 > positive 12/8 blood >   Antimicrobials:  12/8 remdesivir > 12/12 12/8 tocilizumab >   Interim history/subjective:   BP improved Off epinephrine Oxygenation improved per SaO2  Objective   Blood pressure (!) 110/57, pulse (!) 111, temperature 97.9 F (36.6 C), temperature source Axillary, resp. rate (!) 35, height 5\' 11"  (1.803 m), weight 133.5 kg, SpO2 (!) 88 %. CVP:  [17 mmHg-28 mmHg] 28 mmHg  Vent Mode: PRVC FiO2 (%):  [100 %] 100 % Set Rate:  [35 bmp] 35 bmp Vt Set:  [560 mL] 560 mL PEEP:  [16 cmH20] 16 cmH20 Plateau Pressure:  [28 cmH20-45 cmH20] 45 cmH20   Intake/Output Summary (Last 24 hours) at 01/12/2019 0719 Last data filed at 01/12/2019 0600 Gross per 24 hour  Intake 8041.96 ml  Output 2420 ml  Net 5621.96 ml   Filed Weights   01/10/19 0500 01/11/19 0500 01/12/19 0303  Weight: 119 kg 129.3 kg 133.5 kg    Examination:  General:  In bed on vent HENT: NCAT ETT in place PULM: Chest tubes B, vent supported breathing CV: RRR, no mgr GI: BS+, soft, nontender MSK: normal bulk and tone Neuro: sedated on vent     12/24 CXR > L>R airspace disease, likely left effusion  Resolved Hospital Problem list      Assessment & Plan:  Severe acute respiratory Failure with hypoxemia due to COVID 19>severe Plateau pressure 38 12/27 Continue mechanical ventilation per ARDS protocol Target TVol 6-8cc/kgIBW Target Plateau Pressure < 30cm H20 Target driving pressure less than 15 cm of water Target PaO2 55-65: titrate PEEP/FiO2 per protocol As long as PaO2 to FiO2 ratio is less than 1:150 position in prone position for 16 hours a day Check CVP daily if CVL in place Target CVP less than 4, diurese as necessary Ventilator associated pneumonia prevention protocol 12/27 obtain ABG, continue ARDS protocol, discuss goals of care with family  Pneumomediastinum and possible bilateral pneumothoraces Continue chest tubes to suction CXR  Shock, suspect due to pneumomediastinum Continue to wean off levophed and vasopressin for MAP > 65  Need for sedation for mechanical ventilation Continue fentanyl/versed infusions per NIMB protocol D/c continuous nimbex Start prn nimbex RASS target -4 to -5  Afib Tele lovenox  Given significant improvement in his hemodynamics in the last 24 hours will discuss goals of care with family, but it seems reasonable to continue full supportive measures for now.   Best practice:  Diet: start tube feeding Pain/Anxiety/Delirium protocol (if indicated): yes as above VAP protocol (if indicated): yes DVT prophylaxis: full dose lovenox GI prophylaxis: ppi Glucose control: SSI Mobility: bed rest Code Status: full Family Communication:per TRH Disposition: remain in ICU  Labs   CBC: Recent Labs  Lab 01/06/19 0302 01/07/19 0259  01/08/19 0635 01/09/19 0445 01/10/19 0851 01/10/19 1005 01/10/19 1130 01/10/19 1219 01/10/19 1609 01/11/19 1844  WBC 14.8* 12.8* 13.3* 11.5* 12.7*  --   --  16.5*  --   --   NEUTROABS 13.5* 11.6* 12.4* 10.3* 10.9*  --   --   --   --   --   HGB 19.7* 19.2* 19.0* 15.0 15.5 15.3 15.0 14.9 14.6 11.6*  HCT 58.2* 56.3* 56.1* 45.1 49.2 45.0  44.0 48.2 43.0 34.0*  MCV 94.5 92.9 93.0 94.0 100.4*  --   --  102.6*  --   --   PLT 176 165 171 103* 185  --   --  201  --   --     Basic Metabolic Panel: Recent Labs  Lab 01/08/19 1438 01/08/19 1900 01/09/19 0445 01/09/19 1703 01/10/19 0550 01/10/19 0949 01/10/19 1130 01/10/19 1219 01/10/19 1600 01/10/19 1609 01/11/19 0620 01/11/19 1844  NA   < > 139 138  --   --  141 141 144  --  140 144 149*  K   < > 4.0 4.7  --   --  4.9 4.0 4.4  --  4.4 3.6 3.4*  CL  --  97* 95*  --   --  108  --  110  --   --  106  --   CO2  --  29 30  --   --  23  --  23  --   --  29  --   GLUCOSE  --  210* 150*  --   --  198*  --  301*  --   --  411*  --   BUN  --  68* 65*  --   --  71*  --  70*  --   --  51*  --   CREATININE  --  1.29* 1.19  --   --  1.59*  --  1.57*  --   --  1.25*  --   CALCIUM  --  8.8* 9.5  --   --  7.9*  --  7.6*  --   --  6.5*  --   MG  --   --  3.1* 2.6* 2.6*  --   --   --  2.7*  --  2.4  --   PHOS  --   --  5.2* 7.0* 6.4*  --   --   --  5.8*  --  2.9  --    < > = values in this interval not displayed.   GFR: Estimated Creatinine Clearance: 70.1 mL/min (A) (by C-G formula based on SCr of 1.25 mg/dL (H)). Recent Labs  Lab 01/06/19 0302 01/07/19 0259 01/08/19 [id number] 01/09/19 0445 01/09/19 1920 01/10/19 0851 01/10/19 0948 01/10/19 1219  PROCALCITON 0.13 0.12  --   --   --   --   --   --   WBC 14.8* 12.8* 13.3* 11.5*  --  12.7*  --  16.5*  LATICACIDVEN  --   --   --   --  2.7*  --  2.4*  --     Liver Function Tests: Recent Labs  Lab 01/07/19 0259 01/08/19 1438 01/09/19 0445 01/10/19 0949 01/11/19 0620  AST 42* 24 43* 36 43*  ALT 105* 54* 88* 76* 66*  ALKPHOS 47 33* 72 52 48  BILITOT 1.4* 0.6 0.7 1.4* 0.7  PROT 5.9* 3.7* 5.9* 5.8* 4.3*  ALBUMIN 3.0* 1.8* 2.8* 2.6* 1.8*   No  results for input(s): LIPASE, AMYLASE in the last 168 hours. No results for input(s): AMMONIA in the last 168 hours.  ABG    Component Value Date/Time   PHART 7.304 (L) 01/11/2019  1844   PCO2ART 71.3 (HH) 01/11/2019 1844   PO2ART 52.0 (L) 01/11/2019 1844   HCO3 35.4 (H) 01/11/2019 1844   TCO2 38 (H) 01/11/2019 1844   ACIDBASEDEF 5.0 (H) 01/10/2019 1609   O2SAT 81.0 01/11/2019 1844     Coagulation Profile: No results for input(s): INR, PROTIME in the last 168 hours.  Cardiac Enzymes: No results for input(s): CKTOTAL, CKMB, CKMBINDEX, TROPONINI in the last 168 hours.  HbA1C: Hgb A1c MFr Bld  Date/Time Value Ref Range Status  11/25/2018 08:11 AM 6.2 4.6 - 6.5 % Final    Comment:    Glycemic Control Guidelines for People with Diabetes:Non Diabetic:  <6%Goal of Therapy: <7%Additional Action Suggested:  >8%   08/20/2018 01:08 PM 6.3 4.6 - 6.5 % Final    Comment:    Glycemic Control Guidelines for People with Diabetes:Non Diabetic:  <6%Goal of Therapy: <7%Additional Action Suggested:  >8%     CBG: Recent Labs  Lab 01/11/19 0759 01/11/19 1158 01/11/19 1625 01/11/19 1945 01/12/19 0057  GLUCAP 388* 280* 265* 203* 72     Critical care time: 34 minutes     Roselie Awkward, MD [place] [facility] Pager: [phone] Cell: [phone] If no response, call [phone]

## 2019-01-12 NOTE — Progress Notes (Signed)
spoke with daughters today and gave update and answered questions.  Held phone to patient's ear so they could talk to their father.

## 2019-01-12 NOTE — Progress Notes (Signed)
PROGRESS NOTE    Arthur Daniel  [id number] DOB: Jun 14, 1940 DOA: 12/25/2018 PCP: Shelda Pal, DO   Brief Narrative:  GRASON DRAWHORN is a 78 y.o WM PMHx  A. fib, pacemaker, hyperlipidemia, hypothyroidism   Presents to [facility] ED due to 3-week history of abdominal pain (epigastric) with several episodes of nonbloody diarrhea ( 4-6 daily) with last episode being this morning, as well as increased urinary frequency.  No alleviating/aggravating factor for the abdominal pain and it was associated with shortness of breath when he takes deep breath, patient also complained of nonradiating midsternal chest pain.  He denies  vomiting, he states that he has been in self-isolation at home for the past 3 weeks and denies any sick contacts.   ED Course:  He was noted to be febrile with a temperature of 100.26F, was tachypneic and hypoxic with an O2 sat of 87% dose requiring supplemental oxygen via HFNC at 8lpm.  BP was 137/65.  Work-up in the ED showed thrombocytopenia, hyponatremia, hyperglycemia, elevated transaminitis, urinalysis was normal, procalcitonin was <0.10, LDH was elevated at 177, CRP 3.9, fibrinogen 522, LDH 277, lactic acid 1.2. SARS coronavirus 2 Ag was positive.  Developing infiltrates in the lung bases. He was treated with IV Decadron 6 mg, remdesivir, Actemra and Tylenol.  Patient was transferred to [facility] for further management.   Subjective: 12/27 intubated/sedated   Assessment & Plan:   Principal Problem:   COVID-19 Active Problems:   Hypothyroidism   Essential hypertension   Cardiac pacemaker in situ   BPH (benign prostatic hyperplasia)   Anxiety   Acute respiratory failure with hypoxia (HCC)   Dyslipidemia   Pneumonia due to COVID-19 virus   Gastroenteritis due to COVID-19 virus   Thrombocytopenia (HCC)   Emphysema of lung (HCC)   Atrial fibrillation with RVR (HCC)   Hyperglycemia   Covid pneumonia/acute respiratory failure with hypoxia COVID-19  Labs  Recent Labs    2019-01-24 0949 01/11/19 0620  DDIMER 0.51* 1.06*  FERRITIN 329 271  CRP 10.6* 13.6*   12/8 SARS coronavirus positive -12/13 Decadron 6 mg daily---->Solu-Medrol 125 mg TID-->; completed trial of high-dose steroids. -Remdesivir per pharmacy protocol -Vitamins per Covid protocol -12/9 Actemra x1 dose -Vitamins per Covid protocol -Titrate O2 to maintain SPO2> 88% -Prone patient 16 hours/day; if cannot tolerate prone 2 to 3 hours per shift -12/10 PCXR; slightly worsening aeration -12/11 PCXR; infiltrates or atelectasis see results below -12/13 CT scan showed underlying emphysema (patient unaware)/severe Covid pneumonia see results below -Lasix as needed -12/15 Hold on additional Lasix see Inc creatinine below    Emphysema -Emphysema on 12/13 chest CT see below -See Covid pneumonia  Pneumomediastinum and possible bilateral pneumothoraces -Continue chest tubes to suction per PCCM -Daily PCXR  Respiratory acidosis -Vent management per PCCM -Discussed case with Dr. Lake Bells PCCM does not appear patient will survive this hospitalization.  VAP? -Continue current empiric antibiotics  A. fib RVR/CP -Currently NSR  Essential HTN -Strict in and out +2.5 L -Daily weight  Filed Weights   24-Jan-2019 0500 01/11/19 0500 01/12/19 0303  Weight: 119 kg 129.3 kg 133.5 kg   Permanent pacer -Pacer installed secondary to symptomatic bradycardia secondary to thyroid disease.  Only kicks in if patient's heart rate significantly decreased.  Cardiac Arrest  Concern for tension pneumomediastinum/bilateral pneumothoraces -2023/01/24 coded ROSC after 6 minutes CPR: 01/24/23 s/p bilateral chest tubes     Hypotension secondary to shock -Septic vs cardiogenic -On multiple pressors.  Pressors per PCCM -MAP goal>  65  Acute kidney injury  Recent Labs  Lab 01/08/19 1900 01/09/19 0445 01/10/19 0949 01/10/19 1219 01/11/19 0620  CREATININE 1.29* 1.19 1.59* 1.57* 1.25*  -See  Covid pneumonia  Covid gastroenteritis -Abdominal pain/diarrhea resolved most likely secondary to Covid infection -Continue Zofran as needed  BPH  Hyponatremia -Resolved  Hypokalemia -Potassium goal> 4  Elevated transaminitis -Slightly elevated -Continue to monitor liver panel with morning labs  Chronic thrombocytopenia -Platelets currently at 99, no obvious sign of bleeding -Continue to monitor platelets troponin labs Results for HOLT, LEARD (MRN [id number]) as of 01/12/2019 16:18  Ref. Range 01/08/2019 06:35 01/09/2019 04:45 01/10/2019 08:51 01/10/2019 12:19 01/12/2019 08:30  Platelets Latest Ref Range: 150 - 400 K/uL 171 103 (L) 185 201 84 (L)  -Worsening  Hypothyroidism -Synthroid 175 mcg daily   HLD -Zetia 10 mg daily  Prediabetes hyperglycemia -Moderate SSI    DVT prophylaxis: Lovenox Code Status: Full Family Communication: 12/27 Dr. Lake Bells spoke with Sunday Spillers (wife); family deciding on goals of care Disposition Plan: TBD   Consultants:    Procedures/Significant Events:  12/10 PCXR;Persistent bibasilar infiltrates with slight worsening aeration 12/11 PCXR stable bibasilar atelectasis or infiltrates 12/13 CT chest WO contrast;-bilateral ground-glass airspace opacities with a lower lung predominance, consistent with extensive COVID-19 pneumonia. -2. 5 mm nodule in the right lower lobe. No follow-up needed if patient is low-risk. Non-contrast chest CT can be considered in 12 months if patient is high-risk. T - Left coronary artery calcifications. Aortic atherosclerosis. -Emphysema. 12/15 PCXR-persistent bilateral pulmonary infiltrates -Negative LEFT SUBCLAVICULAR EMPHYSEMA APPRECIATED ON PCXR (my read) 12/23 BiPAP per evening MD 12/25 cardiac arrest;ROSC after 6 minutes.  Most likely secondary to large tension pneumomediastinum and bilateral pneumothoraces,  12/25 bilateral chest tubes placed 12/25 Echocardiogram; eft Ventricle: EF=  70 to 75%.  Severely increased left ventricular hypertrophy. Venous: The inferior vena cava is dilated in size with less than 50% respiratory variability, suggesting right atrial pressure of 15 mmHg.    I have personally reviewed and interpreted all radiology studies and my findings are as above.  VENTILATOR SETTINGS: Vent setting; PRVC 12/27 Rate set; 35 VtSet; 560 FiO2; 100% PEEP; 16 SPO2; 88%    Cultures 12/8 SARS coronavirus positive 12/8 blood LEFT wrist NGTD 12/8 blood RIGHT antecubital NGTD   Antimicrobials: Anti-infectives (From admission, onward)   Start     Dose/Rate Stop   01/10/19 2000  vancomycin (VANCOREADY) IVPB 1250 mg/250 mL     1,250 mg 166.7 mL/hr over 90 Minutes     01/09/19 2000  vancomycin (VANCOREADY) IVPB 1750 mg/350 mL  Status:  Discontinued     1,750 mg 175 mL/hr over 120 Minutes 01/10/19 1440   01/09/19 1900  ceFEPIme (MAXIPIME) 2 g in sodium chloride 0.9 % 100 mL IVPB     2 g 200 mL/hr over 30 Minutes     12/25/18 1000  remdesivir 100 mg in sodium chloride 0.9 % 100 mL IVPB  Status:  Discontinued     100 mg 200 mL/hr over 30 Minutes 01/06/2019 2257   12/25/18 1000  remdesivir 100 mg in sodium chloride 0.9 % 100 mL IVPB     100 mg 200 mL/hr over 30 Minutes 12/29/18 1216   12/30/2018 2330  remdesivir 200 mg in sodium chloride 0.9% 250 mL IVPB     200 mg 580 mL/hr over 30 Minutes 12/25/18 0029   12/27/2018 1630  remdesivir 200 mg in sodium chloride 0.9% 250 mL IVPB  Status:  Discontinued  200 mg 580 mL/hr over 30 Minutes 12/25/2018 2257       Devices    LINES / TUBES:  Bilateral chest tubes 12/25>>    Continuous Infusions: . sodium chloride 250 mL (12/26/18 0912)  . ceFEPime (MAXIPIME) IV Stopped (01/12/19 0315)  . epinephrine    . feeding supplement (VITAL 1.5 CAL) 40 mL/hr at 01/11/19 0800  . fentaNYL infusion INTRAVENOUS 200 mcg/hr (01/12/19 0600)  . midazolam 10 mg/hr (01/11/19 1250)  . midazolam 10 mg/hr (01/12/19 0600)  . norepinephrine  (LEVOPHED) Adult infusion 10 mcg/min (01/12/19 0600)  . sodium bicarbonate 150 mEq in dextrose 5% 1000 mL 100 mL/hr at 01/12/19 0600  . vancomycin Stopped (01/11/19 2124)  . vasopressin (PITRESSIN) infusion - *FOR SHOCK* 0.03 Units/min (01/12/19 0600)  . vecuronium (NORCURON) infusion 1 mcg/kg/min (01/12/19 0600)     Objective: Vitals:   01/12/19 0530 01/12/19 0545 01/12/19 0600 01/12/19 0615  BP:      Pulse: (!) 112 (!) 112 (!) 112 (!) 111  Resp: (!) 35 (!) 35 (!) 35 (!) 35  Temp:      TempSrc:      SpO2: (!) 86% (!) 86% (!) 86% (!) 88%  Weight:      Height:        Intake/Output Summary (Last 24 hours) at 01/12/2019 0742 Last data filed at 01/12/2019 0600 Gross per 24 hour  Intake 8041.96 ml  Output 2420 ml  Net 5621.96 ml   Filed Weights   01/10/19 0500 01/11/19 0500 01/12/19 0303  Weight: 119 kg 129.3 kg 133.5 kg   Physical Exam:  General: Intubated/sedated, positive acute respiratory distress Eyes: negative scleral hemorrhage, negative anisocoria, negative icterus ENT: Negative Runny nose, negative gingival bleeding, Neck:  Negative scars, masses, torticollis, lymphadenopathy, JVD Lungs: Tachypneic clear to auscultation bilaterally without wheezes or crackles, bilateral chest tube in place negative air leak.. Cardiovascular: Tachycardic without murmur gallop or rub normal S1 and S2 (pacer left chest wall currently not being paced) Abdomen: negative abdominal pain, nondistended, positive hypoactive, bowel sounds, no rebound, no ascites, no appreciable mass Extremities: No significant cyanosis, clubbing, or edema bilateral lower extremities Skin: Negative rashes, lesions, ulcers Psychiatric: Unable to evaluate Central nervous system: Unable to evaluate        Data Reviewed: Care during the described time interval was provided by me .  I have reviewed this patient's available data, including medical history, events of note, physical examination, and all test  results as part of my evaluation.   CBC: Recent Labs  Lab 01/06/19 0302 01/07/19 0259 01/08/19 0635 01/09/19 0445 01/10/19 0851 01/10/19 1005 01/10/19 1130 01/10/19 1219 01/10/19 1609 01/11/19 1844  WBC 14.8* 12.8* 13.3* 11.5* 12.7*  --   --  16.5*  --   --   NEUTROABS 13.5* 11.6* 12.4* 10.3* 10.9*  --   --   --   --   --   HGB 19.7* 19.2* 19.0* 15.0 15.5 15.3 15.0 14.9 14.6 11.6*  HCT 58.2* 56.3* 56.1* 45.1 49.2 45.0 44.0 48.2 43.0 34.0*  MCV 94.5 92.9 93.0 94.0 100.4*  --   --  102.6*  --   --   PLT 176 165 171 103* 185  --   --  201  --   --    Basic Metabolic Panel: Recent Labs  Lab 01/08/19 1438 01/08/19 1900 01/09/19 0445 01/09/19 1703 01/10/19 0550 01/10/19 0949 01/10/19 1130 01/10/19 1219 01/10/19 1600 01/10/19 1609 01/11/19 0620 01/11/19 1844  NA   < >  139 138  --   --  141 141 144  --  140 144 149*  K   < > 4.0 4.7  --   --  4.9 4.0 4.4  --  4.4 3.6 3.4*  CL  --  97* 95*  --   --  108  --  110  --   --  106  --   CO2  --  29 30  --   --  23  --  23  --   --  29  --   GLUCOSE  --  210* 150*  --   --  198*  --  301*  --   --  411*  --   BUN  --  68* 65*  --   --  71*  --  70*  --   --  51*  --   CREATININE  --  1.29* 1.19  --   --  1.59*  --  1.57*  --   --  1.25*  --   CALCIUM  --  8.8* 9.5  --   --  7.9*  --  7.6*  --   --  6.5*  --   MG  --   --  3.1* 2.6* 2.6*  --   --   --  2.7*  --  2.4  --   PHOS  --   --  5.2* 7.0* 6.4*  --   --   --  5.8*  --  2.9  --    < > = values in this interval not displayed.   GFR: Estimated Creatinine Clearance: 70.1 mL/min (A) (by C-G formula based on SCr of 1.25 mg/dL (H)). Liver Function Tests: Recent Labs  Lab 01/07/19 0259 01/08/19 1438 01/09/19 0445 01/10/19 0949 01/11/19 0620  AST 42* 24 43* 36 43*  ALT 105* 54* 88* 76* 66*  ALKPHOS 47 33* 72 52 48  BILITOT 1.4* 0.6 0.7 1.4* 0.7  PROT 5.9* 3.7* 5.9* 5.8* 4.3*  ALBUMIN 3.0* 1.8* 2.8* 2.6* 1.8*   No results for input(s): LIPASE, AMYLASE in the last 168  hours. No results for input(s): AMMONIA in the last 168 hours. Coagulation Profile: No results for input(s): INR, PROTIME in the last 168 hours. Cardiac Enzymes: No results for input(s): CKTOTAL, CKMB, CKMBINDEX, TROPONINI in the last 168 hours. BNP (last 3 results) No results for input(s): PROBNP in the last 8760 hours. HbA1C: No results for input(s): HGBA1C in the last 72 hours. CBG: Recent Labs  Lab 01/11/19 1625 01/11/19 1945 01/12/19 0057 01/12/19 0342 01/12/19 0728  GLUCAP 265* 203* 72 152* 163*   Lipid Profile: Recent Labs    01/10/19 0550  TRIG 307*   Thyroid Function Tests: No results for input(s): TSH, T4TOTAL, FREET4, T3FREE, THYROIDAB in the last 72 hours. Anemia Panel: Recent Labs    01/10/19 0949 01/11/19 0620  FERRITIN 329 271   Urine analysis:    Component Value Date/Time   COLORURINE YELLOW 12/26/2018 1900   APPEARANCEUR CLEAR 12/29/2018 1900   LABSPEC 1.015 01/04/2019 1900   PHURINE 6.0 12/20/2018 1900   GLUCOSEU NEGATIVE 01/15/2019 1900   HGBUR NEGATIVE 01/05/2019 1900   BILIRUBINUR NEGATIVE 12/23/2018 1900   KETONESUR NEGATIVE 01/07/2019 1900   PROTEINUR 30 (A) 01/10/2019 1900   UROBILINOGEN 1.0 10/03/2006 0054   NITRITE NEGATIVE 01/07/2019 1900   LEUKOCYTESUR NEGATIVE 01/05/2019 1900   Sepsis Labs: @LABRCNTIP (procalcitonin:4,lacticidven:4)  ) No results found for this or any previous visit (from  the past 240 hour(s)).       Radiology Studies: DG Chest 1 View  Result Date: 01/10/2019 CLINICAL DATA:  Status post bilateral chest tube placement. COVID pneumonia. Pneumomediastinum. EXAM: CHEST  1 VIEW 10:56 a.m. COMPARISON:  Chest x-ray dated 01/10/2019 at 9:37 a.m. FINDINGS: Bilateral chest tubes have been inserted. No visible pneumothorax. New subcutaneous emphysema along the left lateral chest wall. Increased subcutaneous emphysema on the right supraclavicular region. Endotracheal tube, feeding tube and right subclavian catheter  appear in good position, unchanged. Heart size and vascularity are normal. Persistent bilateral pulmonary infiltrates, slightly improved at the bases. Pacemaker in place. IMPRESSION: 1. No pneumothorax after chest tube insertion. 2. New subcutaneous emphysema along the left lateral chest wall. 3. Persistent bilateral pulmonary infiltrates, slightly improved at the bases. Electronically Signed   By: Lorriane Shire M.D.   On: 01/10/2019 11:28   DG CHEST PORT 1 VIEW  Result Date: 01/12/2019 CLINICAL DATA:  Bilateral pneumothoraces EXAM: PORTABLE CHEST 1 VIEW COMPARISON:  01/11/2019 FINDINGS: Support Apparatus: --Endotracheal tube: Tip just below the level of the clavicular heads. --Enteric tube:Tip and sideport are below the field of view. --Catheter(s):Right subclavian approach central venous catheter tip is lower SVC --Other: Right chest tube tip projects over the right lung apex. Left chest 2 tip projects over the lateral fourth rib. No residual pneumothorax. Unchanged bilateral interstitial opacities. IMPRESSION: 1. No residual pneumothorax. 2. Unchanged bilateral interstitial opacities. 3. Unchanged position of support devices. Electronically Signed   By: Ulyses Jarred M.D.   On: 01/12/2019 04:28   DG Chest Port 1 View  Result Date: 01/11/2019 CLINICAL DATA:  Bilateral pneumothoraces. EXAM: PORTABLE CHEST 1 VIEW COMPARISON:  01/10/2019 FINDINGS: 1700 hours. Endotracheal tube tip is 4.7 cm above the base of the carina. The NG tube passes into the stomach although the distal tip position is not included on the film. Right subclavian central line tip overlies the region of the innominate vein confluence. Left dual lead pacer again noted. Right chest tube has been pulled back slightly in the interval. Left chest tube also appears to a been withdrawn slightly with the proximal side port now at the level of the pleura. The cardio pericardial silhouette is enlarged. Diffuse interstitial and basilar airspace  disease similar to prior. Telemetry leads overlie the chest. IMPRESSION: 1. Both chest tubes have been pulled back in the interval but appear to remain intrathoracic based on the single frontal projection. No evidence for residual pneumothorax on either side. 2. Diffuse interstitial and airspace disease bilaterally, similar to prior. Electronically Signed   By: Misty Stanley M.D.   On: 01/11/2019 17:42   DG CHEST PORT 1 VIEW  Addendum Date: 01/10/2019   ADDENDUM REPORT: 01/10/2019 10:32 ADDENDUM: Study discussed by telephone with Dr. Lake Bells at [facility] on 01/10/2019 at 1013 hours. He advised the patient underwent recent cardiopulmonary arrest and resuscitation. But a small amount of right neck supraclavicular gas was present yesterday. While we are speaking he examined the patient's anterior chest with bedside Ultrasound, and did not find evidence of pneumothorax. We discussed surveillance with portable chest x-ray considering the patient may be too unstable for chest CT at this time. Electronically Signed   By: Genevie Ann M.D.   On: 01/10/2019 10:32   Result Date: 01/10/2019 CLINICAL DATA:  78 year old male COVID-19. EXAM: PORTABLE CHEST 1 VIEW COMPARISON:  Portable chest 01/09/2019 and earlier. FINDINGS: Portable AP semi upright view at 0937 hours. The patient is more rotated to the left. Increased and  now moderate supraclavicular gas is visible on the right. Questionable pneumomediastinum. No pneumothorax is identified. Endotracheal tube tip remains in good position between the level the clavicles and carina. Enteric feeding tube courses to the abdomen, tip not included. Stable right subclavian central line. Stable left chest pacemaker. Diffuse coarse bilateral pulmonary opacity persists. Left lung base ventilation has improved since yesterday, and lung volumes are mildly larger. No pleural effusion. IMPRESSION: 1. Increasing supraclavicular soft tissue gas might be originating from barotrauma related  pneumomediastinum, but raises the possibility of Occult Pneumothorax. 2. Severe bilateral COVID-19 pneumonia. Ventilation has improved at the left lung base since yesterday. Electronically Signed: By: Genevie Ann M.D. On: 01/10/2019 10:07   ECHOCARDIOGRAM COMPLETE  Result Date: 01/10/2019   ECHOCARDIOGRAM REPORT   Patient Name:   RASSAN AWAD Date of Exam: 01/10/2019 Medical Rec #:  [id number]       Height:       71.0 in Accession #:    [id number]      Weight:       262.3 lb Date of Birth:  Feb 19, 1940       BSA:          2.37 m Patient Age:    22 years        BP:           115/67 mmHg Patient Gender: M               HR:           133 bpm. Exam Location:  Inpatient Procedure: 2D Echo Indications:    Dyspnea 786.09 / R06.00  History:        Patient has prior history of Echocardiogram examinations, most                 recent 05/14/2008. Pacemaker; Risk Factors:Hypertension and                 Dyslipidemia. Covid 19. Severe acute respiratory Failure with                 hypoxemia . Septic shock.  Sonographer:    Darlina Sicilian RDCS Referring Phys: [place]  1. Left ventricular ejection fraction, by visual estimation, is 70 to 75%. The left ventricle has hyperdynamic function. Left ventricular septal wall thickness was normal. There is severely increased left ventricular hypertrophy. The LV appears underfilled.  2. The left ventricle has no regional wall motion abnormalities.  3. Global right ventricle has hyperdynamic systolic function.The right ventricular size is normal. No increase in right ventricular wall thickness.  4. Left atrial size was normal.  5. Right atrial size was normal.  6. The mitral valve is grossly normal. No evidence of mitral valve regurgitation.  7. The tricuspid valve is grossly normal.  8. The aortic valve is tricuspid. Aortic valve regurgitation is not visualized.  9. The pulmonic valve was grossly normal. Pulmonic valve regurgitation is trivial. 10. Aortic root  could not be assessed. 11. Mildly elevated pulmonary artery systolic pressure. 12. A venous catheter is visualized. 13. The inferior vena cava is dilated in size with <50% respiratory variability, suggesting right atrial pressure of 15 mmHg. 14. The interatrial septum was not assessed. FINDINGS  Left Ventricle: Left ventricular ejection fraction, by visual estimation, is 70 to 75%. The left ventricle has hyperdynamic function. The left ventricle has no regional wall motion abnormalities. There is severely increased left ventricular hypertrophy. Right Ventricle: The right ventricular size is normal.  No increase in right ventricular wall thickness. Global RV systolic function is has hyperdynamic systolic function. The tricuspid regurgitant velocity is 2.47 m/s, and with an assumed right atrial pressure of 15 mmHg, the estimated right ventricular systolic pressure is mildly elevated at 39.4 mmHg. Left Atrium: Left atrial size was normal in size. Right Atrium: Right atrial size was normal in size Pericardium: There is no evidence of pericardial effusion. Mitral Valve: The mitral valve is grossly normal. No evidence of mitral valve regurgitation. Tricuspid Valve: The tricuspid valve is grossly normal. Tricuspid valve regurgitation is trivial. Aortic Valve: The aortic valve is tricuspid. Aortic valve regurgitation is not visualized. Pulmonic Valve: The pulmonic valve was grossly normal. Pulmonic valve regurgitation is trivial. Pulmonic regurgitation is trivial. Aorta: Aortic root could not be assessed. Venous: The inferior vena cava is dilated in size with less than 50% respiratory variability, suggesting right atrial pressure of 15 mmHg. IAS/Shunts: The interatrial septum was not assessed. Additional Comments: A venous catheter is visualized.  LEFT VENTRICLE PLAX 2D LVIDd:         2.65 cm LVIDs:         1.42 cm LV PW:         2.02 cm LV IVS:        1.99 cm LV SV:         21 ml LV SV Index:   8.27  TRICUSPID VALVE TR Peak  grad:   24.4 mmHg TR Vmax:        247.00 cm/s  Glori Bickers MD Electronically signed by Glori Bickers MD Signature Date/Time: 01/10/2019/3:19:55 PM    Final         Scheduled Meds: . alfuzosin  10 mg Oral QHS  . artificial tears  1 application Both Eyes [id number]  . vitamin C  500 mg Per Tube Daily  . chlorhexidine  15 mL Mouth Rinse BID  . Chlorhexidine Gluconate Cloth  6 each Topical Daily  . clonazePAM  2 mg Per Tube BID  . dextromethorphan  30 mg Per Tube BID  . enoxaparin (LOVENOX) injection  60 mg Subcutaneous BID  . ezetimibe  10 mg Per Tube Daily  . feeding supplement (PRO-STAT SUGAR FREE 64)  30 mL Per Tube TID  . fentaNYL (SUBLIMAZE) injection  25 mcg Intravenous Once  . guaiFENesin  15 mL Per Tube Q6H  . hydrocortisone sod succinate (SOLU-CORTEF) inj  50 mg Intravenous Q6H  . insulin aspart  0-20 Units Subcutaneous Q4H  . insulin aspart  4 Units Subcutaneous Q4H  . insulin detemir  15 Units Subcutaneous BID  . levothyroxine  175 mcg Per Tube QAC breakfast  . loratadine  10 mg Per Tube Daily  . mouth rinse  15 mL Mouth Rinse q12n4p  . mouth rinse  15 mL Mouth Rinse 10 times per day  . mirabegron ER  50 mg Oral Daily  . oxyCODONE  5 mg Oral Q6H  . pantoprazole sodium  40 mg Per Tube Daily  . traZODone  50 mg Per Tube QHS  . zinc sulfate  220 mg Per Tube Daily   Continuous Infusions: . sodium chloride 250 mL (12/26/18 0912)  . ceFEPime (MAXIPIME) IV Stopped (01/12/19 0315)  . epinephrine    . feeding supplement (VITAL 1.5 CAL) 40 mL/hr at 01/11/19 0800  . fentaNYL infusion INTRAVENOUS 200 mcg/hr (01/12/19 0600)  . midazolam 10 mg/hr (01/11/19 1250)  . midazolam 10 mg/hr (01/12/19 0600)  . norepinephrine (LEVOPHED) Adult infusion 10 mcg/min (01/12/19  0600)  . sodium bicarbonate 150 mEq in dextrose 5% 1000 mL 100 mL/hr at 01/12/19 0600  . vancomycin Stopped (01/11/19 2124)  . vasopressin (PITRESSIN) infusion - *FOR SHOCK* 0.03 Units/min (01/12/19 0600)  .  vecuronium (NORCURON) infusion 1 mcg/kg/min (01/12/19 0600)     LOS: 19 days   The patient is critically ill with multiple organ systems failure and requires high complexity decision making for assessment and support, frequent evaluation and titration of therapies, application of advanced monitoring technologies and extensive interpretation of multiple databases. Critical Care Time devoted to patient care services described in this note  Time spent: 40 minutes     Corlene Sabia, Geraldo Docker, MD [facility] Pager [phone]  If 7PM-7AM, please contact night-coverage [email or web address] Password [facility] 01/12/2019, 7:42 AM

## 2019-01-12 NOTE — Progress Notes (Signed)
eLink Physician-Brief Progress Note Patient Name: Arthur Daniel DOB: [birth year] MRN: [id number]   Date of Service  01/12/2019  HPI/Events of Note     HPI/Events of Note  Pt desaturated into the 60's, he's not a candidate for safe proning at this point, with bilateral chest tubes etc, Pt is on 100 % Fio2 and PEEP of 18. Pt is currently DNR with family contemplating comfort measures per RN communication.  eICU Interventions  Will review CXR for development of tension pneumothorax.      Frederik Pear 01/12/2019, 4:24 AM        Electronically signed by Frederik Pear, MD at 01/12/2019 4:28 AM   Admission (Current) on 11/27/2018     Detailed Report    Note shared with patient   eICU Interventions          Frederik Pear 01/12/2019, 4:42 AM

## 2019-01-13 DIAGNOSIS — Z9911 Dependence on respirator [ventilator] status: Secondary | ICD-10-CM

## 2019-01-13 LAB — CBC WITH DIFFERENTIAL/PLATELET
Abs Immature Granulocytes: 0.1 10*3/uL — ABNORMAL HIGH (ref 0.00–0.07)
Basophils Absolute: 0 10*3/uL (ref 0.0–0.1)
Basophils Relative: 1 %
Eosinophils Absolute: 0 10*3/uL (ref 0.0–0.5)
Eosinophils Relative: 0 %
HCT: 38.3 % — ABNORMAL LOW (ref 39.0–52.0)
Hemoglobin: 11.3 g/dL — ABNORMAL LOW (ref 13.0–17.0)
Immature Granulocytes: 2 %
Lymphocytes Relative: 7 %
Lymphs Abs: 0.3 10*3/uL — ABNORMAL LOW (ref 0.7–4.0)
MCH: 31.4 pg (ref 26.0–34.0)
MCHC: 29.5 g/dL — ABNORMAL LOW (ref 30.0–36.0)
MCV: 106.4 fL — ABNORMAL HIGH (ref 80.0–100.0)
Monocytes Absolute: 0.5 10*3/uL (ref 0.1–1.0)
Monocytes Relative: 11 %
Neutro Abs: 3.3 10*3/uL (ref 1.7–7.7)
Neutrophils Relative %: 79 %
Platelets: 93 10*3/uL — ABNORMAL LOW (ref 150–400)
RBC: 3.6 MIL/uL — ABNORMAL LOW (ref 4.22–5.81)
RDW: 14.1 % (ref 11.5–15.5)
WBC: 4.2 10*3/uL (ref 4.0–10.5)
nRBC: 1.2 % — ABNORMAL HIGH (ref 0.0–0.2)

## 2019-01-13 LAB — C-REACTIVE PROTEIN: CRP: 11 mg/dL — ABNORMAL HIGH (ref <1.0)

## 2019-01-13 LAB — GLUCOSE, CAPILLARY
Glucose-Capillary: 104 mg/dL — ABNORMAL HIGH (ref 70–99)
Glucose-Capillary: 153 mg/dL — ABNORMAL HIGH (ref 70–99)
Glucose-Capillary: 229 mg/dL — ABNORMAL HIGH (ref 70–99)
Glucose-Capillary: 230 mg/dL — ABNORMAL HIGH (ref 70–99)
Glucose-Capillary: 268 mg/dL — ABNORMAL HIGH (ref 70–99)
Glucose-Capillary: 274 mg/dL — ABNORMAL HIGH (ref 70–99)

## 2019-01-13 LAB — COMPREHENSIVE METABOLIC PANEL
ALT: 66 U/L — ABNORMAL HIGH (ref 0–44)
AST: 66 U/L — ABNORMAL HIGH (ref 15–41)
Albumin: 1.9 g/dL — ABNORMAL LOW (ref 3.5–5.0)
Alkaline Phosphatase: 49 U/L (ref 38–126)
Anion gap: 6 (ref 5–15)
BUN: 51 mg/dL — ABNORMAL HIGH (ref 8–23)
CO2: 37 mmol/L — ABNORMAL HIGH (ref 22–32)
Calcium: 7.4 mg/dL — ABNORMAL LOW (ref 8.9–10.3)
Chloride: 107 mmol/L (ref 98–111)
Creatinine, Ser: 1.04 mg/dL (ref 0.61–1.24)
GFR calc Af Amer: >60 mL/min (ref >60)
GFR calc non Af Amer: >60 mL/min (ref >60)
Glucose, Bld: 259 mg/dL — ABNORMAL HIGH (ref 70–99)
Potassium: 5 mmol/L (ref 3.5–5.1)
Sodium: 150 mmol/L — ABNORMAL HIGH (ref 135–145)
Total Bilirubin: 0.4 mg/dL (ref 0.3–1.2)
Total Protein: 5.1 g/dL — ABNORMAL LOW (ref 6.5–8.1)

## 2019-01-13 LAB — PHOSPHORUS: Phosphorus: 1.5 mg/dL — ABNORMAL LOW (ref 2.5–4.6)

## 2019-01-13 LAB — MAGNESIUM: Magnesium: 3 mg/dL — ABNORMAL HIGH (ref 1.7–2.4)

## 2019-01-13 LAB — FERRITIN: Ferritin: 248 ng/mL (ref 24–336)

## 2019-01-13 LAB — D-DIMER, QUANTITATIVE: D-Dimer, Quant: 0.71 ug/mL-FEU — ABNORMAL HIGH (ref 0.00–0.50)

## 2019-01-13 MED ORDER — GLYCOPYRROLATE 1 MG PO TABS
1.0000 mg | ORAL_TABLET | ORAL | Status: DC | PRN
  Filled 2019-01-13: qty 1

## 2019-01-13 MED ORDER — MORPHINE SULFATE (PF) 2 MG/ML IV SOLN
2.0000 mg | INTRAVENOUS | Status: DC | PRN
  Filled 2019-01-13 (×2): qty 2

## 2019-01-13 MED ORDER — GLYCOPYRROLATE 0.2 MG/ML IJ SOLN: 0.2000 mg | INTRAMUSCULAR | Status: DC | PRN

## 2019-01-13 MED ORDER — DEXTROSE 5 % IV SOLN: INTRAVENOUS | Status: DC

## 2019-01-13 MED ORDER — MIDAZOLAM HCL 2 MG/2ML IJ SOLN: 2.0000 mg | INTRAMUSCULAR | Status: DC | PRN

## 2019-01-13 MED ORDER — DIPHENHYDRAMINE HCL 50 MG/ML IJ SOLN: 25.0000 mg | INTRAMUSCULAR | Status: DC | PRN

## 2019-01-13 MED ORDER — SODIUM PHOSPHATES 45 MMOLE/15ML IV SOLN
20.0000 mmol | Freq: Once | INTRAVENOUS | Status: AC
  Administered 2019-01-13: 09:00:00 20 mmol via INTRAVENOUS
  Filled 2019-01-13: qty 6.67

## 2019-01-13 MED ORDER — POLYVINYL ALCOHOL 1.4 % OP SOLN
1.0000 [drp] | Freq: Four times a day (QID) | OPHTHALMIC | Status: DC | PRN
  Filled 2019-01-13: qty 15

## 2019-01-13 MED ORDER — ACETAMINOPHEN 650 MG RE SUPP: 650.0000 mg | Freq: Four times a day (QID) | RECTAL | Status: DC | PRN

## 2019-01-13 MED ORDER — ACETAMINOPHEN 325 MG PO TABS: 650.0000 mg | ORAL_TABLET | Freq: Four times a day (QID) | ORAL | Status: DC | PRN

## 2019-01-14 ENCOUNTER — Encounter: Payer: Self-pay | Admitting: Pulmonary Disease

## 2019-01-15 DIAGNOSIS — J982 Interstitial emphysema: Secondary | ICD-10-CM

## 2019-01-15 DIAGNOSIS — J939 Pneumothorax, unspecified: Secondary | ICD-10-CM

## 2019-01-17 NOTE — Progress Notes (Signed)
Pt transported to comfort care room for family visit. Pt transported on ventilator by RT and RN. VS remained acceptable throughout. RT will continue to monitor.

## 2019-01-17 NOTE — Progress Notes (Signed)
PCCM:  Family visit scheduled for 3pm.  Comfort care measures will be started afterwards.   [facility] 02-06-2019 1:31 PM

## 2019-01-17 NOTE — Progress Notes (Signed)
NAME:  Arthur Daniel, MRN:  [id number], DOB:  January 27, 1940, LOS: 6 ADMISSION DATE:  12/25/2018, CONSULTATION DATE:  12/20 REFERRING MD:  Florene Glen, CHIEF COMPLAINT:  Dyspnea   Brief History   79 y/o male admitted on 12/8 for severe acute respiratory failure with hypoxemia due to COVID 19 pneumonia.   Past Medical History  Pacemaker in place Hypertension Hypothyroidism Hyperlipidemia BPH  Significant Hospital Events   01/12/2019 admitted 12/15 transfer to ICU  12/23 BIPAP per [facility] MD  Consults:  PCCM  Procedures:    Significant Diagnostic Tests:  12/13 CT chest images personally reviewed> emphysema upper lobes, bilateral ggo 12/25 echo> LVEF normal, underfilled LV, IVC dilated, RV also hyperdynamic  Micro Data:  12/8 sars cov 2 > positive 12/8 blood >   Antimicrobials:  12/8 remdesivir > 12/12 12/8 tocilizumab >   Interim history/subjective:   Intubated on mechanical life support critically ill.  Objective   Blood pressure (!) 110/57, pulse (!) 113, temperature 99.3 F (37.4 C), temperature source Axillary, resp. rate 20, height 5\' 11"  (1.803 m), weight 133.5 kg, SpO2 (!) 85 %. CVP:  [8 mmHg-21 mmHg] 15 mmHg  Vent Mode: PRVC FiO2 (%):  [100 %] 100 % Set Rate:  [35 bmp] 35 bmp Vt Set:  [560 mL] 560 mL PEEP:  [16 cmH20] 16 cmH20 Plateau Pressure:  [24 cmH20-40 cmH20] 24 cmH20   Intake/Output Summary (Last 24 hours) at 2019/02/05 1123 Last data filed at Feb 05, 2019 [id number] Gross per 24 hour  Intake 2534.16 ml  Output 1975 ml  Net 559.16 ml   Filed Weights   01/10/19 0500 01/11/19 0500 01/12/19 0303  Weight: 119 kg 129.3 kg 133.5 kg    Examination:  General: Intubated on mechanical ventilation critically ill HENT: NCAT, endotracheal tube in place PULM: Bilateral chest tubes, titling present, bilateral ventilated breath sounds CV: Regular rate rhythm, S1-S2 no MRG GI: Soft, obese pannus MSK: Some dependent edema Neuro: Sedated no mechanical  12/28 CXR  bilateral airspace disease  The patient's images have been independently reviewed by me.    Resolved Hospital Problem list     Assessment & Plan:  Severe acute respiratory Failure with hypoxemia due to COVID 19>severe Plateau pressure 38 12/27 Continue mechanical ventilation per ARDS protocol Target TVol 6-8cc/kgIBW Target Plateau Pressure < 30cm H20 Target driving pressure less than 15 cm of water Target PaO2 55-65: titrate PEEP/FiO2 per protocol Check CVP daily if CVL in place Target CVP less than 4, diurese as necessary Ventilator associated pneumonia prevention protocol 12/28: on going family discussion today. Likely plans for palliative withdrawal later this afternoon/evening.   Pneumomediastinum and possible bilateral pneumothoraces CT to suction  -20 cm H20  Shock, suspect due to pneumomediastinum Remains on levo  Wean to MAP goal >37mmHg   Need for sedation for mechanical ventilation Fent and versed infusion   Afib Tele   GOC: DNR, palliative care consulted yesterday   Best practice:  Diet: start tube feeding Pain/Anxiety/Delirium protocol (if indicated): yes as above VAP protocol (if indicated): yes DVT prophylaxis: full dose lovenox GI prophylaxis: ppi Glucose control: SSI Mobility: bed rest Code Status: full Family Communication:per TRH Disposition: remain in ICU  Labs   CBC: Recent Labs  Lab 01/08/19 0635 01/09/19 0445 01/10/19 0851 01/10/19 1219 01/10/19 1609 01/11/19 1844 01/12/19 0830 01/12/19 1215 05-Feb-2019 0500  WBC 13.3* 11.5* 12.7* 16.5*  --   --  4.1  --  4.2  NEUTROABS 12.4* 10.3* 10.9*  --   --   --  3.4  --  3.3  HGB 19.0* 15.0 15.5 14.9 14.6 11.6* 11.5* 10.2* 11.3*  HCT 56.1* 45.1 49.2 48.2 43.0 34.0* 37.9* 30.0* 38.3*  MCV 93.0 94.0 100.4* 102.6*  --   --  105.6*  --  106.4*  PLT 171 103* 185 201  --   --  84*  --  93*    Basic Metabolic Panel: Recent Labs  Lab 01/09/19 0445 01/10/19 0550 01/10/19 0949 01/10/19 1219  01/10/19 1600 01/11/19 0620 01/11/19 1844 01/12/19 0830 01/12/19 1215 February 03, 2019 0500  NA   < >  --  141 144  --  144 149* 147* 147* 150*  K   < >  --  4.9 4.4  --  3.6 3.4* 4.6 4.1 5.0  CL  --   --  108 110  --  106  --  104  --  107  CO2  --   --  23 23  --  29  --  36*  --  37*  GLUCOSE  --   --  198* 301*  --  411*  --  250*  --  259*  BUN  --   --  71* 70*  --  51*  --  48*  --  51*  CREATININE  --   --  1.59* 1.57*  --  1.25*  --  0.91  --  1.04  CALCIUM  --   --  7.9* 7.6*  --  6.5*  --  6.8*  --  7.4*  MG  --  2.6*  --   --  2.7* 2.4  --  2.7*  --  3.0*  PHOS  --  6.4*  --   --  5.8* 2.9  --  2.3*  --  1.5*   < > = values in this interval not displayed.   GFR: Estimated Creatinine Clearance: 84.3 mL/min (by C-G formula based on SCr of 1.04 mg/dL). Recent Labs  Lab 01/07/19 0259 01/09/19 1920 01/10/19 0851 01/10/19 0948 01/10/19 1219 01/12/19 0830 02/03/19 0500  PROCALCITON 0.12  --   --   --   --   --   --   WBC 12.8*  --  12.7*  --  16.5* 4.1 4.2  LATICACIDVEN  --  2.7*  --  2.4*  --   --   --     Liver Function Tests: Recent Labs  Lab 01/09/19 0445 01/10/19 0949 01/11/19 0620 01/12/19 0830 02/03/2019 0500  AST 43* 36 43* 59* 66*  ALT 88* 76* 66* 58* 66*  ALKPHOS 72 52 48 41 49  BILITOT 0.7 1.4* 0.7 0.7 0.4  PROT 5.9* 5.8* 4.3* 4.9* 5.1*  ALBUMIN 2.8* 2.6* 1.8* 1.9* 1.9*   No results for input(s): LIPASE, AMYLASE in the last 168 hours. No results for input(s): AMMONIA in the last 168 hours.  ABG    Component Value Date/Time   PHART 7.287 (L) 01/12/2019 1215   PCO2ART 77.0 (HH) 01/12/2019 1215   PO2ART 45.0 (L) 01/12/2019 1215   HCO3 36.8 (H) 01/12/2019 1215   TCO2 39 (H) 01/12/2019 1215   ACIDBASEDEF 5.0 (H) 01/10/2019 1609   O2SAT 72.0 01/12/2019 1215     Coagulation Profile: No results for input(s): INR, PROTIME in the last 168 hours.  Cardiac Enzymes: No results for input(s): CKTOTAL, CKMB, CKMBINDEX, TROPONINI in the last 168  hours.  HbA1C: Hgb A1c MFr Bld  Date/Time Value Ref Range Status  11/25/2018 08:11 AM 6.2 4.6 - 6.5 %  Final    Comment:    Glycemic Control Guidelines for People with Diabetes:Non Diabetic:  <6%Goal of Therapy: <7%Additional Action Suggested:  >8%   08/20/2018 01:08 PM 6.3 4.6 - 6.5 % Final    Comment:    Glycemic Control Guidelines for People with Diabetes:Non Diabetic:  <6%Goal of Therapy: <7%Additional Action Suggested:  >8%     CBG: Recent Labs  Lab 01/12/19 1623 01/12/19 1942 07-Feb-2019 0014 2019-02-07 0442 2019-02-07 0825  GLUCAP 239* 236* 229* 268* 230*     This patient is critically ill with multiple organ system failure; which, requires frequent high complexity decision making, assessment, support, evaluation, and titration of therapies. This was completed through the application of advanced monitoring technologies and extensive interpretation of multiple databases. During this encounter critical care time was devoted to patient care services described in this note for 34 minutes.  Garner Nash, DO [facility] 02-07-19 11:23 AM

## 2019-01-17 NOTE — Progress Notes (Signed)
Pt sats dropped to the 50s after repositioning. E-links nurses was notified. Instructed to milk chest tube, listen for breath sounds and call RT for recruitment treatment.  Ausculation of lungs revealed coarse junky lungs. Huge clot was milked out of left chest tube. RT and charge RN at bedside unable to do recruitment treatment due to chest tubes. [facility] Doc aware that pt will no longer be repositioned. Noted to follow Dr. Lake Bells orders from dayshift. Sats have slowly increased, pt does not appear in any distress. No other issue at this time.

## 2019-01-17 NOTE — Progress Notes (Signed)
PT Cancellation/Discharge Note  Patient Details Name: Arthur Daniel MRN: [id number] DOB: 1940/11/19   Cancelled Treatment:    Reason Eval/Treat Not Completed: Medical issues which prohibited therapy.  Since last therapy session, pt suffered PEA arrest, chest tubes inserted and is not medically stable at all.  At this time, PT to sign off.  Thanks,  Verdene Lennert, PT, DPT  Acute Rehabilitation [phone] pager [phone] office  @ [facility]: [phone]     Harvie Heck 2019/01/19, 8:58 AM

## 2019-01-17 NOTE — Death Summary Note (Signed)
Time of death 1750. CCMD notified, family notified, patient belonging sent with patient to morgue.  Patient belongings; 2 white pt belongings bags with jeans, sweatshirt.

## 2019-01-17 NOTE — Progress Notes (Signed)
Pt extubated for comfort care at 1750.

## 2019-01-17 NOTE — Progress Notes (Signed)
Inpatient Diabetes Program Recommendations  AACE/ADA: New Consensus Statement on Inpatient Glycemic Control (2015)  Target Ranges:  Prepandial:   less than 140 mg/dL      Peak postprandial:   less than 180 mg/dL (1-2 hours)      Critically ill patients:  140 - 180 mg/dL   Lab Results  Component Value Date   GLUCAP 274 (H) 19-Jan-2019   HGBA1C 6.2 11/25/2018    Review of Glycemic Control: Results for Arthur Daniel, Arthur Daniel (MRN [id number]) as of 2019-01-19 11:51  Ref. Range 01/12/2019 19:42 2019-01-19 00:14 2019/01/19 04:42 Jan 19, 2019 08:25 19-Jan-2019 11:13  Glucose-Capillary Latest Ref Range: 70 - 99 mg/dL 236 (H) 229 (H) 268 (H) 230 (H) 274 (H)    Diabetes history: None Outpatient Diabetes medications: None Current orders for Inpatient glycemic control:  Levemir 15 units bid Novolog resistant q 4 hours Vital at 70 cc/hr Solucortef 50 mg IV q 6 hours Novolog 4 units q 4 hours Inpatient Diabetes Program Recommendations:    If appropriate, consider increasing Novolog tube feed coverage to 6 units tid with meals.   Thanks Adah Perl, RN, BC-ADM Inpatient Diabetes Coordinator Pager [phone] (8a-5p)

## 2019-01-17 NOTE — Death Summary Note (Signed)
DEATH SUMMARY   Patient Details  Name: Arthur Daniel MRN: [id number] DOB: 1940-04-01  Admission/Discharge Information   Admit Date:  01-08-2019  Date of Death: Date of Death: January 28, 2019  Time of Death: Time of Death: 04/24/48  Length of Stay: 04-20-22  Referring Physician: Shelda Pal, DO   Reason(s) for Hospitalization  79 y/o male admitted on 01/08/2023 for severe acute respiratory failure with hypoxemia due to COVID 19 pneumonia.   Diagnoses  Preliminary cause of death: COVID-19 virus infection Secondary Diagnoses (including complications and co-morbidities):  Principal Problem:   COVID-19 Active Problems:   Hypothyroidism   Essential hypertension   Cardiac pacemaker in situ   BPH (benign prostatic hyperplasia)   Anxiety   Acute respiratory failure with hypoxia (HCC)   Dyslipidemia   Pneumonia due to COVID-19 virus   Gastroenteritis due to COVID-19 virus   Thrombocytopenia (HCC)   Emphysema of lung (HCC)   Atrial fibrillation with RVR (HCC)   Hyperglycemia   Pneumothorax   Pneumomediastinum [facility])   Brief Hospital Course (including significant findings, care, treatment, and services provided and events leading to death)  Arthur Daniel is a 79 y.o. year old male who admitted on January 08, 2023 for severe acute respiratory failure with hypoxemia due to COVID 19 pneumonia.   Patient was admitted to the hospital, spent 20 days here.  Required mechanical ventilation.  Ultimately suffered bilateral pneumothorax and pneumomediastinum.  All of this was related to complications from [id number] and severe ARDS.  Ultimately patient continued to have progressive shocklike state.  Unfortunately due to his patient's medical comorbidities and continued decline.  Patient was made comfort care.  Patient was palliatively extubated in the intensive care unit and passed away.   Pertinent Labs and Studies  Significant Diagnostic Studies DG Chest 1 View  Result Date: 01/10/2019 CLINICAL DATA:  Status  post bilateral chest tube placement. COVID pneumonia. Pneumomediastinum. EXAM: CHEST  1 VIEW 10:56 a.m. COMPARISON:  Chest x-ray dated 01/10/2019 at 9:37 a.m. FINDINGS: Bilateral chest tubes have been inserted. No visible pneumothorax. New subcutaneous emphysema along the left lateral chest wall. Increased subcutaneous emphysema on the right supraclavicular region. Endotracheal tube, feeding tube and right subclavian catheter appear in good position, unchanged. Heart size and vascularity are normal. Persistent bilateral pulmonary infiltrates, slightly improved at the bases. Pacemaker in place. IMPRESSION: 1. No pneumothorax after chest tube insertion. 2. New subcutaneous emphysema along the left lateral chest wall. 3. Persistent bilateral pulmonary infiltrates, slightly improved at the bases. Electronically Signed   By: Lorriane Shire M.D.   On: 01/10/2019 11:28   CT CHEST WO CONTRAST  Result Date: 12/29/2018 CLINICAL DATA:  Short of breath.  COVID-19 positive. EXAM: CT CHEST WITHOUT CONTRAST TECHNIQUE: Multidetector CT imaging of the chest was performed following the standard protocol without IV contrast. COMPARISON:  Chest radiograph, 12/27/2018 and earlier studies. FINDINGS: Cardiovascular: Heart is normal in size. Left coronary artery calcifications. No pericardial effusion. Great vessels are normal in caliber. Aortic atherosclerotic calcifications. No aneurysm. Mediastinum/Nodes: No neck base, axillary, mediastinal or hilar masses or enlarged lymph nodes. Trachea and esophagus are unremarkable. Left anterior chest wall sequential pacemaker is well positioned. Lungs/Pleura: Bilateral ground-glass airspace opacities with sparing of the apices. Underlying changes of paraseptal and centrilobular emphysema. 5 mm nodule, right lower lobe, image 33, series 4. No other nodules. No pleural effusion. No pneumothorax. Upper Abdomen: No acute abnormality. Musculoskeletal: No fracture or acute finding.  No bone lesion.  IMPRESSION: 1. Bilateral ground-glass airspace opacities with  a lower lung predominance, consistent with extensive COVID-19 pneumonia. 2. 5 mm nodule in the right lower lobe. No follow-up needed if patient is low-risk. Non-contrast chest CT can be considered in 12 months if patient is high-risk. This recommendation follows the consensus statement: Guidelines for Management of Incidental Pulmonary Nodules Detected on CT Images: From the Fleischner Society 2017; Radiology 2017; 284:228-243. 3. Left coronary artery calcifications. Aortic atherosclerosis. Emphysema. Aortic Atherosclerosis (ICD10-I70.0) and Emphysema (ICD10-J43.9). Electronically Signed   By: Lajean Manes M.D.   On: 12/29/2018 15:17   DG CHEST PORT 1 VIEW  Result Date: 01/12/2019 CLINICAL DATA:  Bilateral pneumothoraces EXAM: PORTABLE CHEST 1 VIEW COMPARISON:  01/11/2019 FINDINGS: Support Apparatus: --Endotracheal tube: Tip just below the level of the clavicular heads. --Enteric tube:Tip and sideport are below the field of view. --Catheter(s):Right subclavian approach central venous catheter tip is lower SVC --Other: Right chest tube tip projects over the right lung apex. Left chest 2 tip projects over the lateral fourth rib. No residual pneumothorax. Unchanged bilateral interstitial opacities. IMPRESSION: 1. No residual pneumothorax. 2. Unchanged bilateral interstitial opacities. 3. Unchanged position of support devices. Electronically Signed   By: Ulyses Jarred M.D.   On: 01/12/2019 04:28   DG Chest Port 1 View  Result Date: 01/11/2019 CLINICAL DATA:  Bilateral pneumothoraces. EXAM: PORTABLE CHEST 1 VIEW COMPARISON:  01/10/2019 FINDINGS: 1700 hours. Endotracheal tube tip is 4.7 cm above the base of the carina. The NG tube passes into the stomach although the distal tip position is not included on the film. Right subclavian central line tip overlies the region of the innominate vein confluence. Left dual lead pacer again noted. Right chest  tube has been pulled back slightly in the interval. Left chest tube also appears to a been withdrawn slightly with the proximal side port now at the level of the pleura. The cardio pericardial silhouette is enlarged. Diffuse interstitial and basilar airspace disease similar to prior. Telemetry leads overlie the chest. IMPRESSION: 1. Both chest tubes have been pulled back in the interval but appear to remain intrathoracic based on the single frontal projection. No evidence for residual pneumothorax on either side. 2. Diffuse interstitial and airspace disease bilaterally, similar to prior. Electronically Signed   By: Misty Stanley M.D.   On: 01/11/2019 17:42   DG CHEST PORT 1 VIEW  Addendum Date: 01/10/2019   ADDENDUM REPORT: 01/10/2019 10:32 ADDENDUM: Study discussed by telephone with Dr. Lake Bells at [facility] on 01/10/2019 at 1013 hours. He advised the patient underwent recent cardiopulmonary arrest and resuscitation. But a small amount of right neck supraclavicular gas was present yesterday. While we are speaking he examined the patient's anterior chest with bedside Ultrasound, and did not find evidence of pneumothorax. We discussed surveillance with portable chest x-ray considering the patient may be too unstable for chest CT at this time. Electronically Signed   By: Genevie Ann M.D.   On: 01/10/2019 10:32   Result Date: 01/10/2019 CLINICAL DATA:  79 year old male COVID-19. EXAM: PORTABLE CHEST 1 VIEW COMPARISON:  Portable chest 01/09/2019 and earlier. FINDINGS: Portable AP semi upright view at 0937 hours. The patient is more rotated to the left. Increased and now moderate supraclavicular gas is visible on the right. Questionable pneumomediastinum. No pneumothorax is identified. Endotracheal tube tip remains in good position between the level the clavicles and carina. Enteric feeding tube courses to the abdomen, tip not included. Stable right subclavian central line. Stable left chest pacemaker. Diffuse coarse  bilateral pulmonary opacity persists. Left lung base  ventilation has improved since yesterday, and lung volumes are mildly larger. No pleural effusion. IMPRESSION: 1. Increasing supraclavicular soft tissue gas might be originating from barotrauma related pneumomediastinum, but raises the possibility of Occult Pneumothorax. 2. Severe bilateral COVID-19 pneumonia. Ventilation has improved at the left lung base since yesterday. Electronically Signed: By: Genevie Ann M.D. On: 01/10/2019 10:07   DG CHEST PORT 1 VIEW  Result Date: 01/09/2019 CLINICAL DATA:  Hypotension. COVID-19. EXAM: PORTABLE CHEST 1 VIEW 3:55 p.m. COMPARISON:  01/09/2019 11:25 a.m. FINDINGS: Endotracheal tube and central venous catheter appear in good position, unchanged. Feeding tube tip is below the diaphragm. Extensive bilateral pulmonary infiltrates are unchanged, worse on the left than the right. Aortic atherosclerosis. Note is made of some gas in the soft tissues in the right supraclavicular region of unknown etiology, best seen on the earlier study on 01/09/2019. IMPRESSION: 1. No change in the extensive bilateral pulmonary infiltrates. 2. Gas in the soft tissues in the right supraclavicular region of unknown etiology. 3.  Aortic Atherosclerosis (ICD10-I70.0). Electronically Signed   By: Lorriane Shire M.D.   On: 01/09/2019 16:36   DG CHEST PORT 1 VIEW  Result Date: 01/09/2019 CLINICAL DATA:  Hypoxia. Follow-up lung densities. Intubated. EXAM: PORTABLE CHEST 1 VIEW COMPARISON:  Earlier today. FINDINGS: Interval endotracheal tube in satisfactory position. Feeding tube extending into the stomach. Stable left subclavian bipolar pacemaker and leads. No significant change in diffuse prominence of the interstitial markings in both lungs and patchy airspace opacity in the left lower lung zone and mild patchy opacity at the right lung base. No visible pleural fluid. Unremarkable bones. IMPRESSION: 1. Interval endotracheal tube in satisfactory  position. 2. Stable probable bilateral lower lung zone pneumonia, remaining greater on the left. 3. Stable diffuse bilateral interstitial pneumonitis. Electronically Signed   By: Claudie Revering M.D.   On: 01/09/2019 11:53   DG CHEST PORT 1 VIEW  Result Date: 01/09/2019 CLINICAL DATA:  Hypoxia. EXAM: PORTABLE CHEST 1 VIEW COMPARISON:  01/07/2019 FINDINGS: Left-sided pacemaker unchanged. Enteric tube courses into the region of the stomach as tip is not visualized. Lordotic technique is demonstrated. Lungs are somewhat hypoinflated with persistent stable moderate mixed interstitial airspace density throughout both lungs. There is interval worsening of consolidation over the left mid to lower lung. Remainder the exam is unchanged. IMPRESSION: 1. Interval worsening consolidation over the left mid to lower lung which may be due to infection versus effusion with atelectasis. 2. Stable diffuse bilateral mixed interstitial airspace density which may be due to edema or infection. 3. Enteric tube coursing into the region of the stomach with tip not visualized. Electronically Signed   By: Marin Olp M.D.   On: 01/09/2019 08:02   DG CHEST PORT 1 VIEW  Result Date: 01/07/2019 CLINICAL DATA:  Hypoxia. EXAM: PORTABLE CHEST 1 VIEW COMPARISON:  01/05/2019. FINDINGS: Cardiac pacer stable position. Heart size normal. Diffuse severe bilateral pulmonary infiltrates again noted. No interim change. No pleural effusion or pneumothorax. IMPRESSION: 1.  Cardiac pacer in stable position.  Heart size stable. 2. Diffuse severe bilateral pulmonary infiltrates again noted. No interim change. Electronically Signed   By: Marcello Moores  Register   On: 01/07/2019 07:12   DG CHEST PORT 1 VIEW  Result Date: 01/05/2019 CLINICAL DATA:  Shortness of breath.  COVID-19 EXAM: PORTABLE CHEST 1 VIEW COMPARISON:  Radiograph 12/31/2018 FINDINGS: Left-sided pacemaker in place. Bilateral heterogeneous lung opacities with slight worsening on the right in  the interim. No pneumothorax. No evidence of pleural effusion. Unchanged  heart size. IMPRESSION: Bilateral heterogeneous lung opacities with slight worsening on the right over the past 5 days. Electronically Signed   By: Keith Rake M.D.   On: 01/05/2019 04:55   DG CHEST PORT 1 VIEW  Result Date: 12/31/2018 CLINICAL DATA:  COVID-19 EXAM: PORTABLE CHEST 1 VIEW COMPARISON:  Portable exam 1307 hours compared to 12/27/2018 FINDINGS: LEFT subclavian sequential transvenous pacemaker leads project at RIGHT atrium and RIGHT ventricle. Normal heart size, mediastinal contours, and pulmonary vascularity. Atherosclerotic calcification aorta. Patchy airspace infiltrates bilaterally consistent with multifocal pneumonia and history of COVID. No pleural effusion or pneumothorax. Bones demineralized. IMPRESSION: Persistent BILATERAL pulmonary infiltrates consistent with history of COVID-19. Electronically Signed   By: Lavonia Dana M.D.   On: 12/31/2018 13:39   DG CHEST PORT 1 VIEW  Result Date: 12/27/2018 CLINICAL DATA:  Shortness of breath. EXAM: PORTABLE CHEST 1 VIEW COMPARISON:  December 26, 2018. FINDINGS: Stable cardiomediastinal silhouette. No pneumothorax is noted. Left-sided pacemaker is unchanged in position. Stable bibasilar atelectasis or infiltrates are noted. No pleural effusion is noted. Bony thorax is unremarkable. IMPRESSION: Stable bibasilar atelectasis or infiltrates are noted. Electronically Signed   By: Marijo Conception M.D.   On: 12/27/2018 10:44   DG CHEST PORT 1 VIEW  Result Date: 12/26/2018 CLINICAL DATA:  Shortness of breath. EXAM: PORTABLE CHEST 1 VIEW COMPARISON:  01/01/2019 FINDINGS: The heart is upper limits of normal in size given the AP projection and portable technique. Stable tortuosity and calcification of the thoracic aorta. Persistent patchy lower lobe infiltrates with slight worsening basilar aeration bilaterally. No definite pleural effusions or pulmonary edema. IMPRESSION:  Persistent bibasilar infiltrates with slight worsening aeration. Electronically Signed   By: Marijo Sanes M.D.   On: 12/26/2018 11:43   DG Chest Portable 1 View  Result Date: 12/23/2018 CLINICAL DATA:  Pt states that he has been having urinary frequency and diarrhea for a couple of weeks. Denies fever, denies blood in urine or stool. EXAM: PORTABLE CHEST 1 VIEW COMPARISON:  03/31/2018 FINDINGS: LEFT-sided transvenous pacemaker leads overlie the RIGHT atrium and RIGHT ventricle. Shallow lung inflation accentuate bronchovascular markings. However there is increased opacity in the lung bases, LEFT greater than RIGHT, consistent with developing infiltrates. No pulmonary edema. IMPRESSION: 1. Shallow inflation. 2. Developing infiltrates in the lung bases. Electronically Signed   By: Nolon Nations M.D.   On: 12/21/2018 15:30   ECHOCARDIOGRAM COMPLETE  Result Date: 01/10/2019   ECHOCARDIOGRAM REPORT   Patient Name:   RITTER SANDEFER Date of Exam: 01/10/2019 Medical Rec #:  [id number]       Height:       71.0 in Accession #:    [id number]      Weight:       262.3 lb Date of Birth:  1940/03/02       BSA:          2.37 m Patient Age:    55 years        BP:           115/67 mmHg Patient Gender: M               HR:           133 bpm. Exam Location:  Inpatient Procedure: 2D Echo Indications:    Dyspnea 786.09 / R06.00  History:        Patient has prior history of Echocardiogram examinations, most  recent 05/14/2008. Pacemaker; Risk Factors:Hypertension and                 Dyslipidemia. Covid 19. Severe acute respiratory Failure with                 hypoxemia . Septic shock.  Sonographer:    Darlina Sicilian RDCS Referring Phys: [place]  1. Left ventricular ejection fraction, by visual estimation, is 70 to 75%. The left ventricle has hyperdynamic function. Left ventricular septal wall thickness was normal. There is severely increased left ventricular hypertrophy. The LV appears  underfilled.  2. The left ventricle has no regional wall motion abnormalities.  3. Global right ventricle has hyperdynamic systolic function.The right ventricular size is normal. No increase in right ventricular wall thickness.  4. Left atrial size was normal.  5. Right atrial size was normal.  6. The mitral valve is grossly normal. No evidence of mitral valve regurgitation.  7. The tricuspid valve is grossly normal.  8. The aortic valve is tricuspid. Aortic valve regurgitation is not visualized.  9. The pulmonic valve was grossly normal. Pulmonic valve regurgitation is trivial. 10. Aortic root could not be assessed. 11. Mildly elevated pulmonary artery systolic pressure. 12. A venous catheter is visualized. 13. The inferior vena cava is dilated in size with <50% respiratory variability, suggesting right atrial pressure of 15 mmHg. 14. The interatrial septum was not assessed. FINDINGS  Left Ventricle: Left ventricular ejection fraction, by visual estimation, is 70 to 75%. The left ventricle has hyperdynamic function. The left ventricle has no regional wall motion abnormalities. There is severely increased left ventricular hypertrophy. Right Ventricle: The right ventricular size is normal. No increase in right ventricular wall thickness. Global RV systolic function is has hyperdynamic systolic function. The tricuspid regurgitant velocity is 2.47 m/s, and with an assumed right atrial pressure of 15 mmHg, the estimated right ventricular systolic pressure is mildly elevated at 39.4 mmHg. Left Atrium: Left atrial size was normal in size. Right Atrium: Right atrial size was normal in size Pericardium: There is no evidence of pericardial effusion. Mitral Valve: The mitral valve is grossly normal. No evidence of mitral valve regurgitation. Tricuspid Valve: The tricuspid valve is grossly normal. Tricuspid valve regurgitation is trivial. Aortic Valve: The aortic valve is tricuspid. Aortic valve regurgitation is not  visualized. Pulmonic Valve: The pulmonic valve was grossly normal. Pulmonic valve regurgitation is trivial. Pulmonic regurgitation is trivial. Aorta: Aortic root could not be assessed. Venous: The inferior vena cava is dilated in size with less than 50% respiratory variability, suggesting right atrial pressure of 15 mmHg. IAS/Shunts: The interatrial septum was not assessed. Additional Comments: A venous catheter is visualized.  LEFT VENTRICLE PLAX 2D LVIDd:         2.65 cm LVIDs:         1.42 cm LV PW:         2.02 cm LV IVS:        1.99 cm LV SV:         21 ml LV SV Index:   8.27  TRICUSPID VALVE TR Peak grad:   24.4 mmHg TR Vmax:        247.00 cm/s  Glori Bickers MD Electronically signed by Glori Bickers MD Signature Date/Time: 01/10/2019/3:19:55 PM    Final    [place] EKG SITE RITE  Result Date: 01/09/2019 If Site Rite image not attached, placement could not be confirmed due to current cardiac rhythm.  [place] EKG SITE RITE  Result Date: 01/08/2019 If Site Rite image not attached, placement could not be confirmed due to current cardiac rhythm.   Microbiology No results found for this or any previous visit (from the past 240 hour(s)).  Lab Basic Metabolic Panel: Recent Labs  Lab 01/09/19 0445 01/10/19 0550 01/10/19 0949 01/10/19 1219 01/10/19 1600 01/11/19 0620 01/11/19 1844 01/12/19 0830 01/12/19 1215 15-Jan-2019 0500  NA   < >  --  141 144  --  144 149* 147* 147* 150*  K   < >  --  4.9 4.4  --  3.6 3.4* 4.6 4.1 5.0  CL  --   --  108 110  --  106  --  104  --  107  CO2  --   --  23 23  --  29  --  36*  --  37*  GLUCOSE  --   --  198* 301*  --  411*  --  250*  --  259*  BUN  --   --  71* 70*  --  51*  --  48*  --  51*  CREATININE  --   --  1.59* 1.57*  --  1.25*  --  0.91  --  1.04  CALCIUM  --   --  7.9* 7.6*  --  6.5*  --  6.8*  --  7.4*  MG  --  2.6*  --   --  2.7* 2.4  --  2.7*  --  3.0*  PHOS  --  6.4*  --   --  5.8* 2.9  --  2.3*  --  1.5*   < > = values in this interval not  displayed.   Liver Function Tests: Recent Labs  Lab 01/09/19 0445 01/10/19 0949 01/11/19 0620 01/12/19 0830 Jan 15, 2019 0500  AST 43* 36 43* 59* 66*  ALT 88* 76* 66* 58* 66*  ALKPHOS 72 52 48 41 49  BILITOT 0.7 1.4* 0.7 0.7 0.4  PROT 5.9* 5.8* 4.3* 4.9* 5.1*  ALBUMIN 2.8* 2.6* 1.8* 1.9* 1.9*   No results for input(s): LIPASE, AMYLASE in the last 168 hours. No results for input(s): AMMONIA in the last 168 hours. CBC: Recent Labs  Lab 01/09/19 0445 01/10/19 0851 01/10/19 1219 01/10/19 1609 01/11/19 1844 01/12/19 0830 01/12/19 1215 01-15-2019 0500  WBC 11.5* 12.7* 16.5*  --   --  4.1  --  4.2  NEUTROABS 10.3* 10.9*  --   --   --  3.4  --  3.3  HGB 15.0 15.5 14.9 14.6 11.6* 11.5* 10.2* 11.3*  HCT 45.1 49.2 48.2 43.0 34.0* 37.9* 30.0* 38.3*  MCV 94.0 100.4* 102.6*  --   --  105.6*  --  106.4*  PLT 103* 185 201  --   --  84*  --  93*   Cardiac Enzymes: No results for input(s): CKTOTAL, CKMB, CKMBINDEX, TROPONINI in the last 168 hours. Sepsis Labs: Recent Labs  Lab 01/09/19 1920 01/10/19 0851 01/10/19 0948 01/10/19 1219 01/12/19 0830 01-15-19 0500  WBC  --  12.7*  --  16.5* 4.1 4.2  LATICACIDVEN 2.7*  --  2.4*  --   --   --     Procedures/Operations  Chest tubes, central access   Kooper Chriswell L Aly Hauser 01/15/2019, 4:57 PM

## 2019-01-17 DEATH — deceased

## 2019-02-17 DEATH — deceased

## 2019-05-26 ENCOUNTER — Ambulatory Visit: Payer: Medicare HMO | Admitting: Family Medicine

## 2019-07-31 ENCOUNTER — Ambulatory Visit: Payer: Medicare HMO | Admitting: *Deleted

## 2019-11-11 IMAGING — CR CHEST - 2 VIEW
2 series · 2 of 2 positions shown · non-contrast
Comparison: December 27, 2017

CLINICAL DATA: Shortness of breath and chest pain

EXAM:
CHEST - 2 VIEW

[w chest pa]
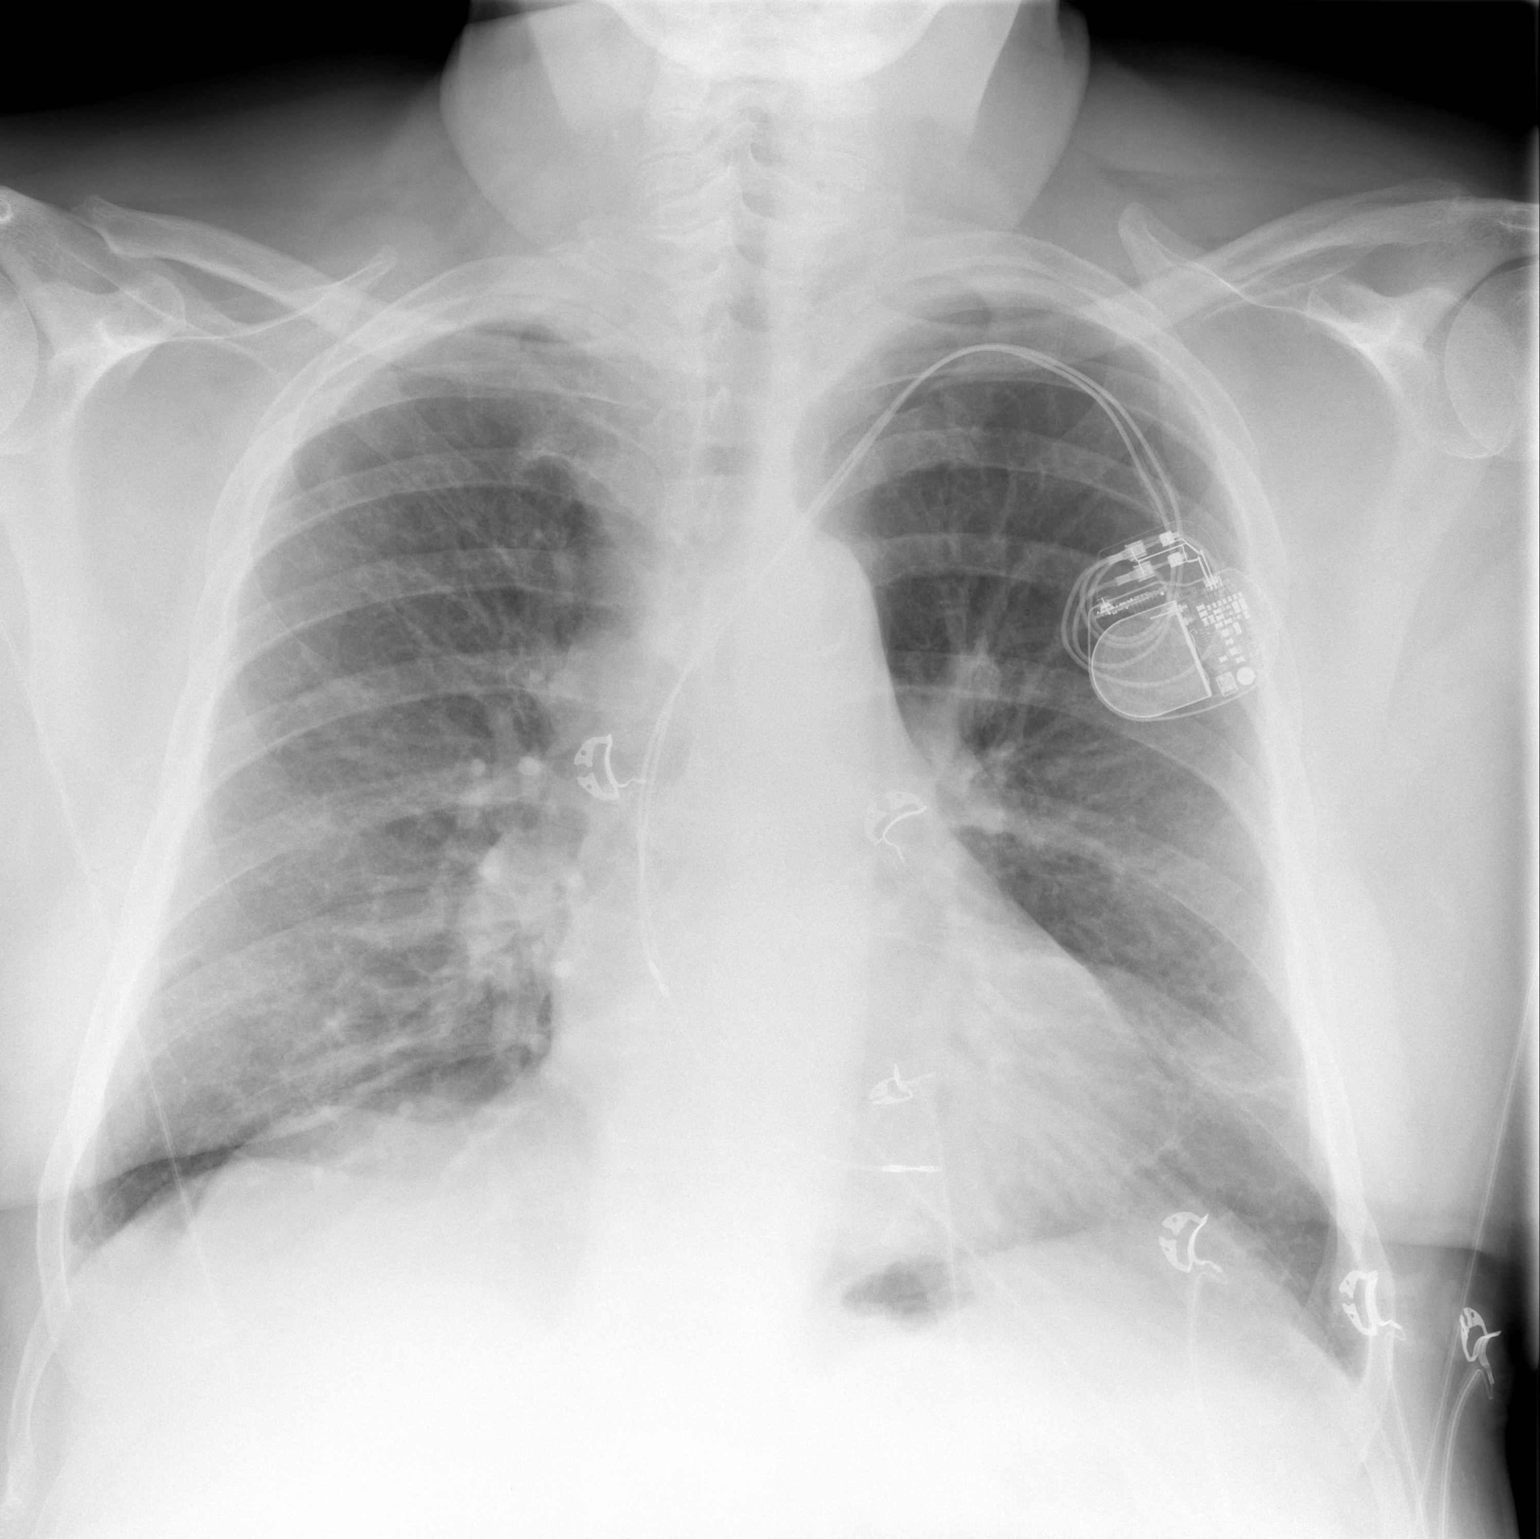

[w chest lat]
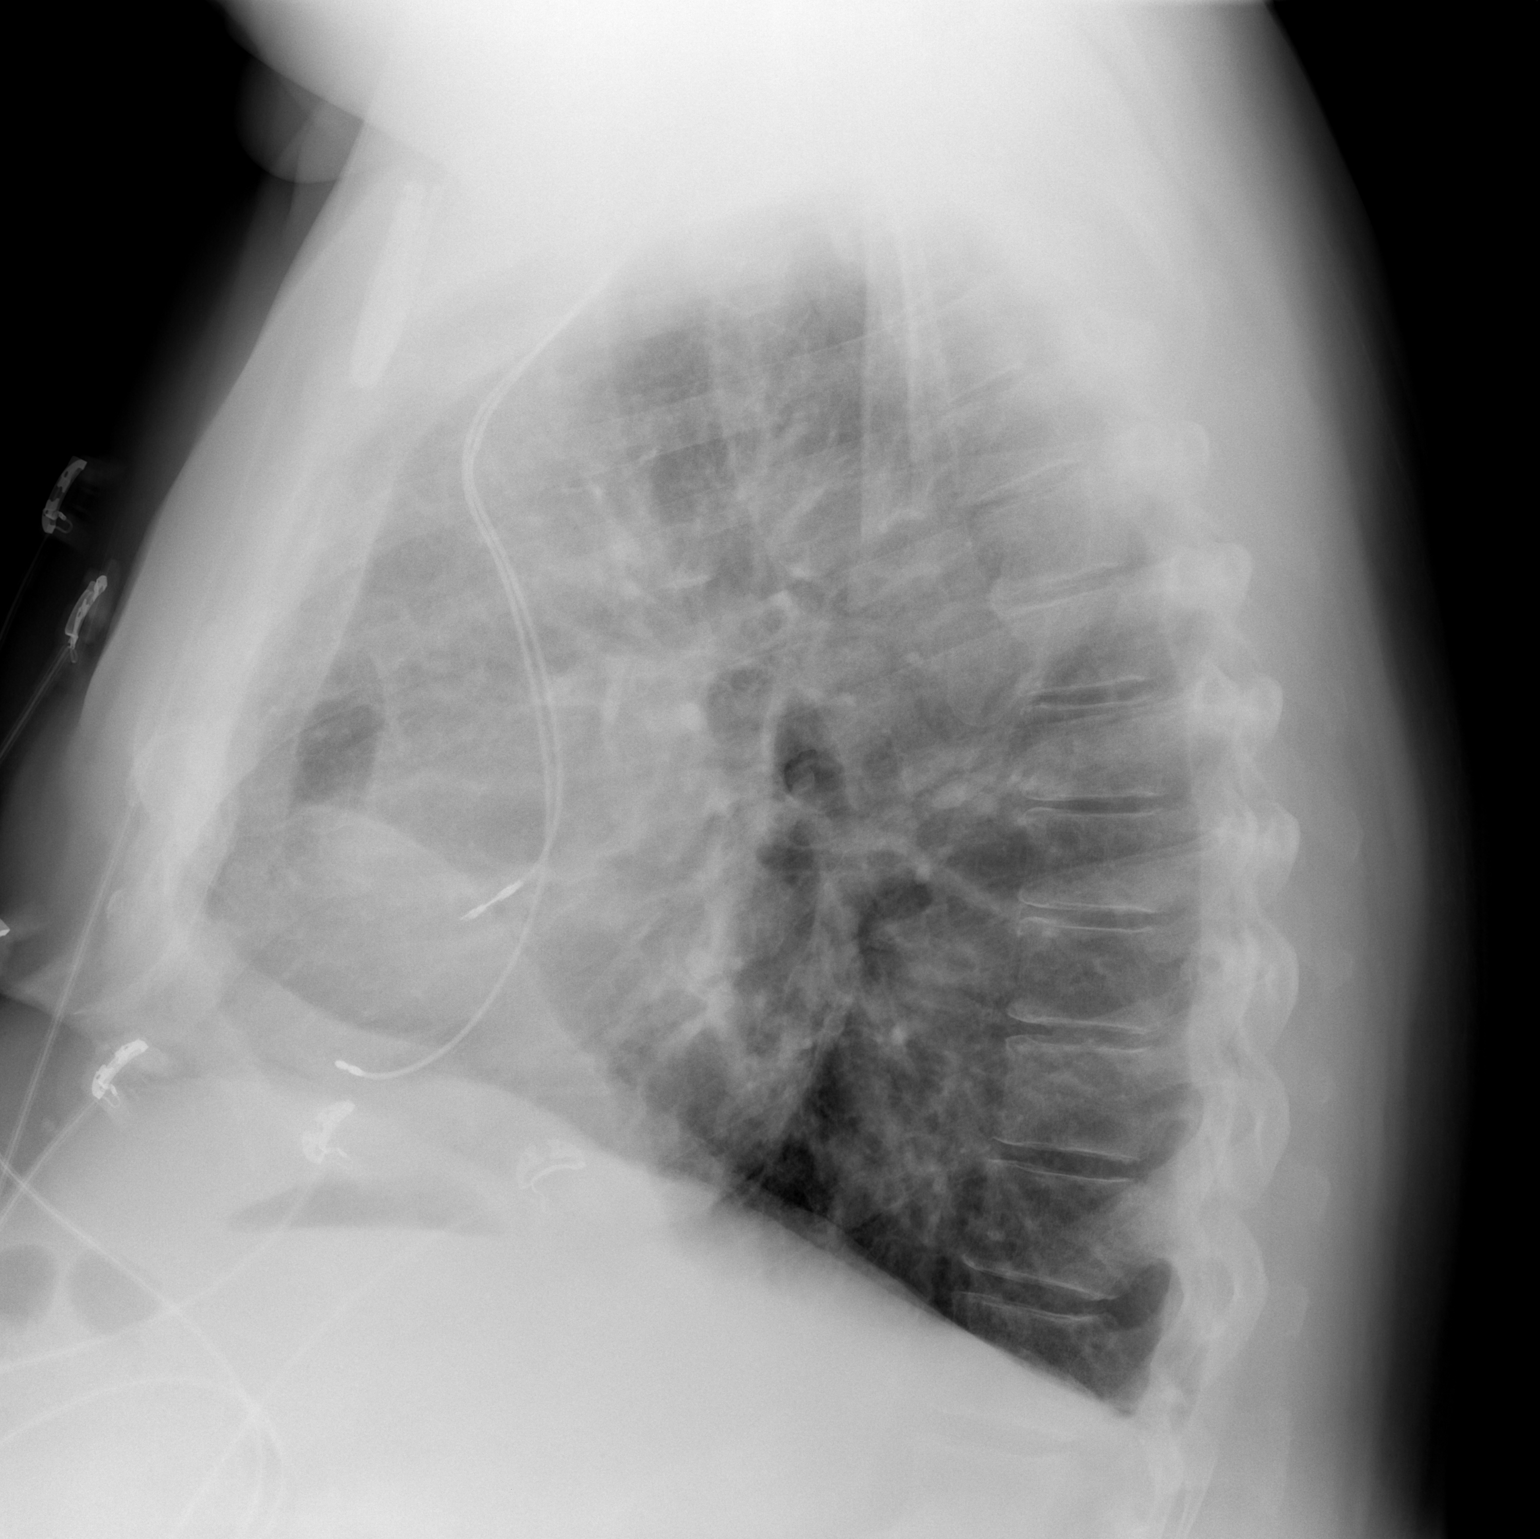

[2 of 2 positions shown; findings below may reference images not displayed]

FINDINGS: There is mild scarring in the left base. There is no edema or
consolidation. The heart size and pulmonary vascularity are normal.
Pacemaker leads are attached to the right atrium and right
ventricle. There is aortic atherosclerosis. There is mild
degenerative change in the thoracic spine.
IMPRESSION: Mild scarring left base. No edema or consolidation. Stable cardiac
silhouette. Pacemaker leads attached to right atrium and right
ventricle. Aortic Atherosclerosis ([id number]).

## 2020-08-07 IMAGING — DX DG CHEST 1V PORT
1 series · 1 of 1 positions shown · non-contrast
Comparison: 12/24/2018

CLINICAL DATA: Shortness of breath.

EXAM:
PORTABLE CHEST 1 VIEW

[chest]
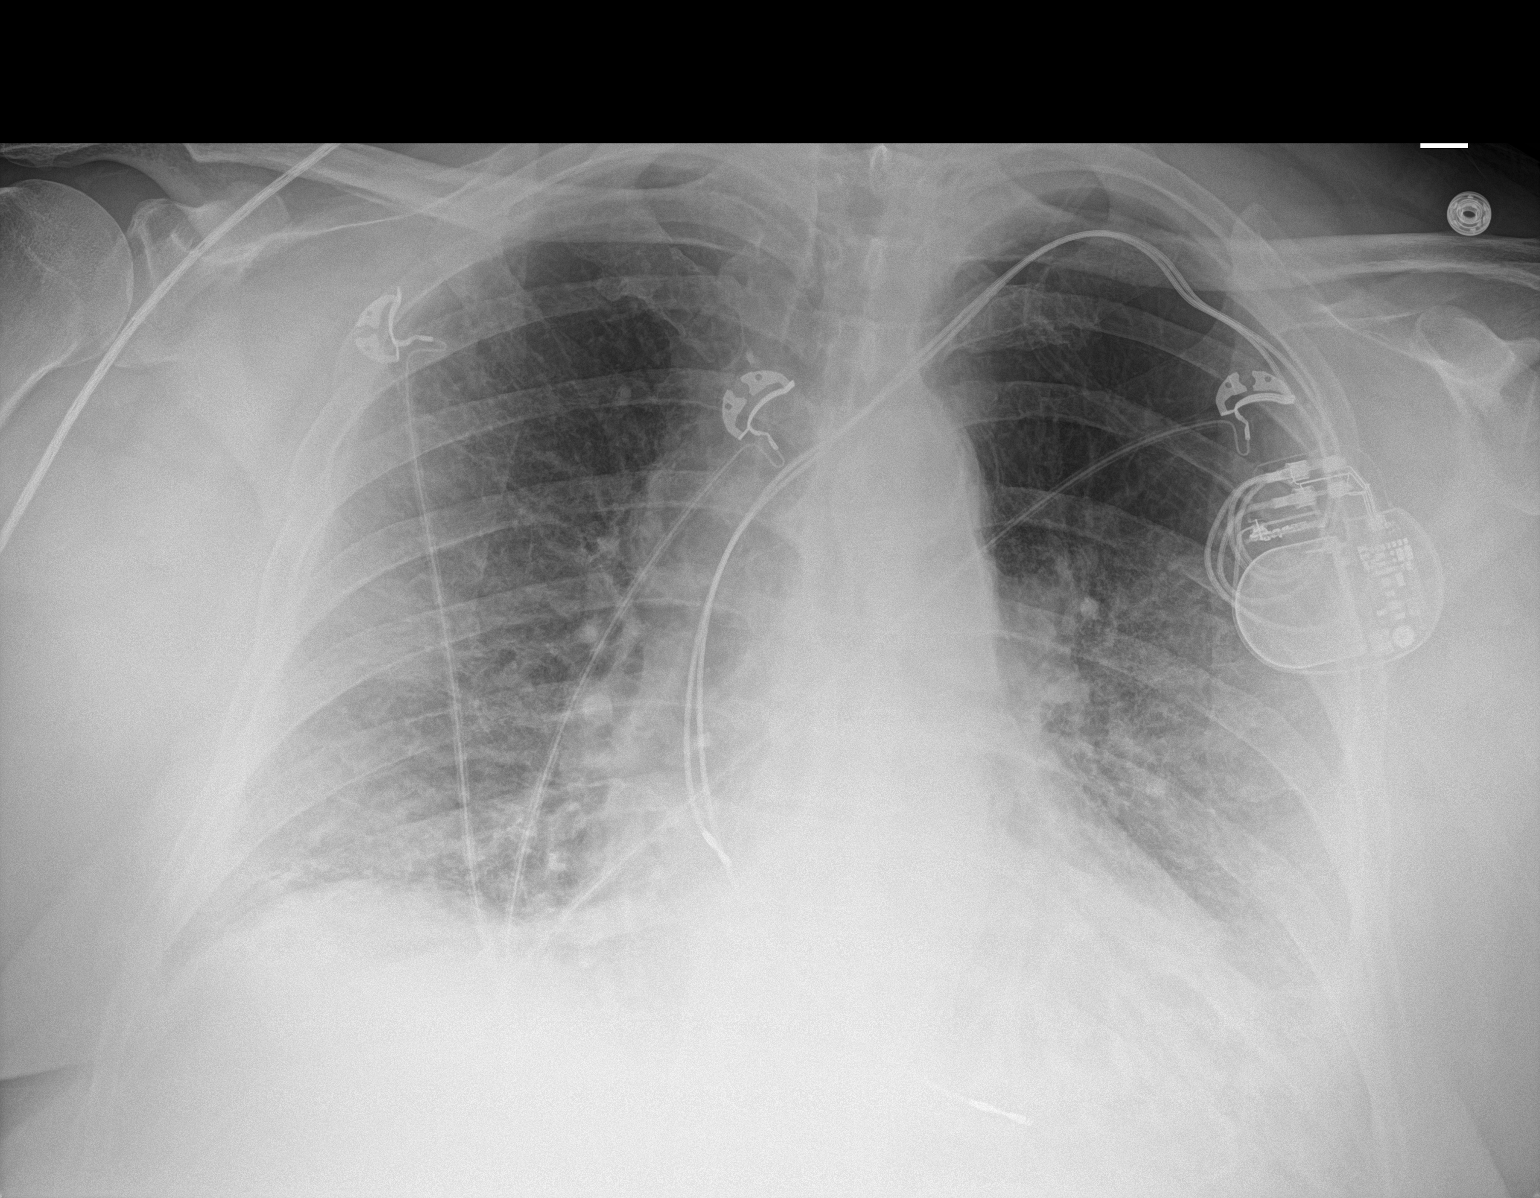

[1 of 1 positions shown; findings below may reference images not displayed]

FINDINGS: The heart is upper limits of normal in size given the AP projection
and portable technique. Stable tortuosity and calcification of the
thoracic aorta.

Persistent patchy lower lobe infiltrates with slight worsening
basilar aeration bilaterally. No definite pleural effusions or
pulmonary edema.
IMPRESSION: Persistent bibasilar infiltrates with slight worsening aeration.

## 2020-08-08 IMAGING — DX DG CHEST 1V PORT
1 series · 1 of 1 positions shown · non-contrast
Comparison: December 26, 2018.

CLINICAL DATA: Shortness of breath.

EXAM:
PORTABLE CHEST 1 VIEW

[chest]
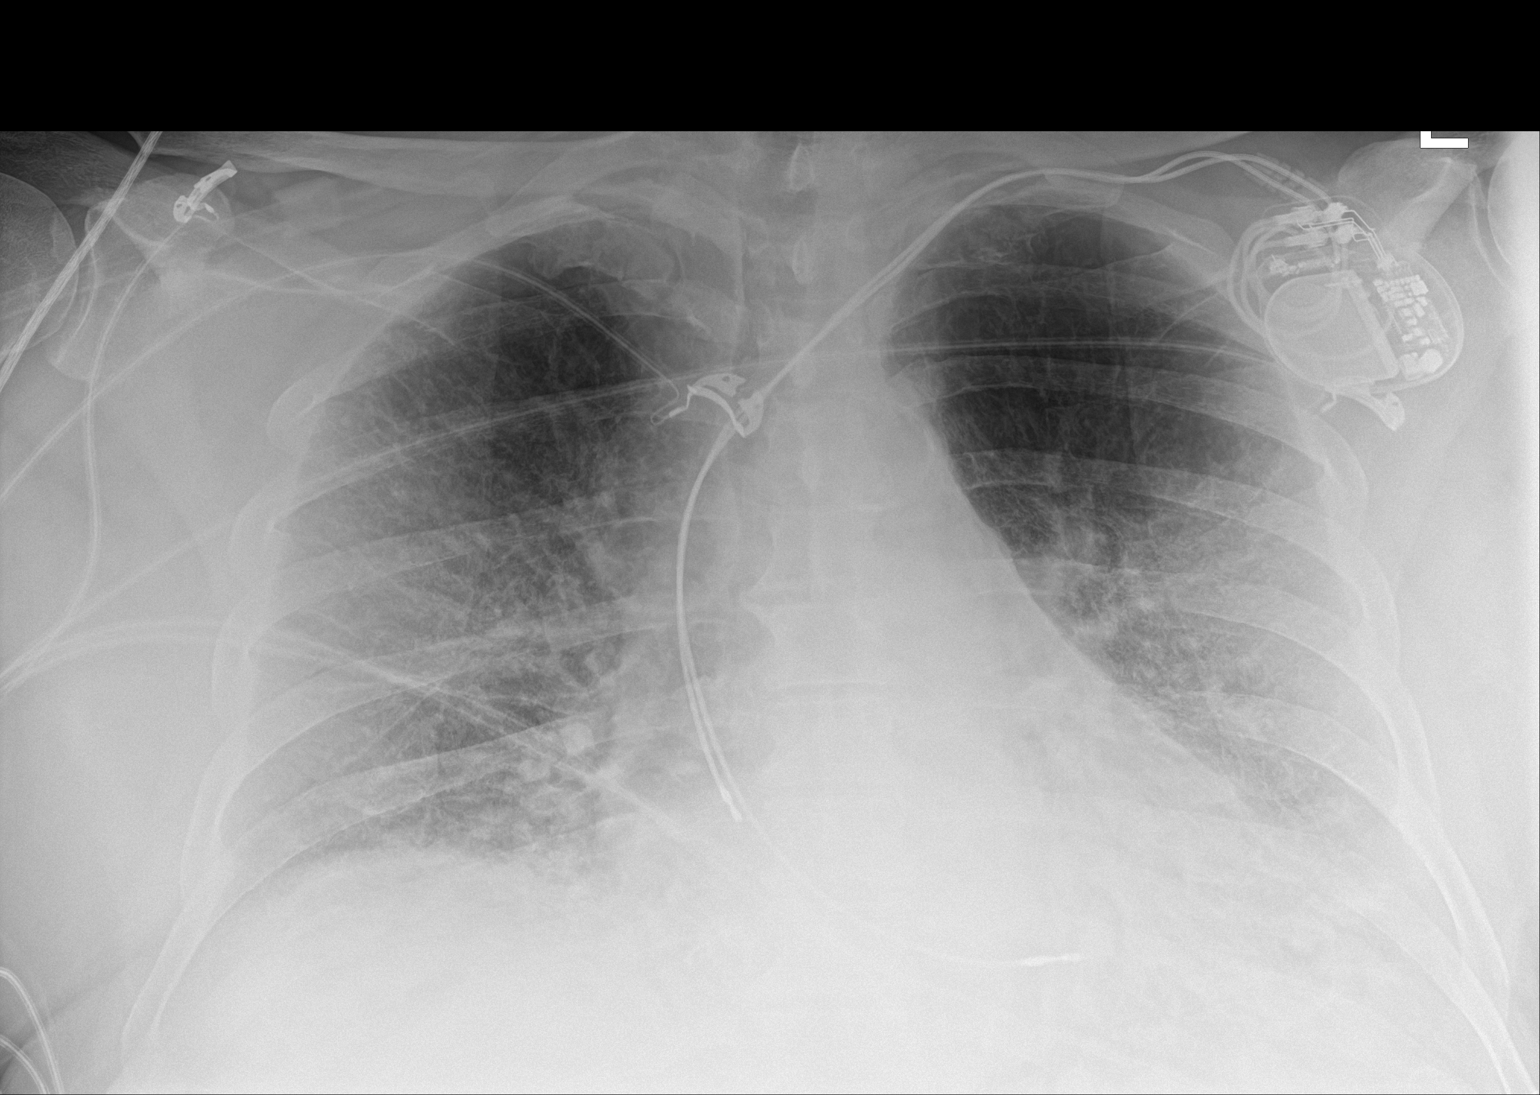

[1 of 1 positions shown; findings below may reference images not displayed]

FINDINGS: Stable cardiomediastinal silhouette. No pneumothorax is noted.
Left-sided pacemaker is unchanged in position. Stable bibasilar
atelectasis or infiltrates are noted. No pleural effusion is noted.
Bony thorax is unremarkable.
IMPRESSION: Stable bibasilar atelectasis or infiltrates are noted.

## 2020-08-24 IMAGING — DX DG CHEST 1V PORT
1 series · 1 of 1 positions shown · non-contrast
Comparison: 01/11/2019

CLINICAL DATA: Bilateral pneumothoraces

EXAM:
PORTABLE CHEST 1 VIEW

[chest ap]
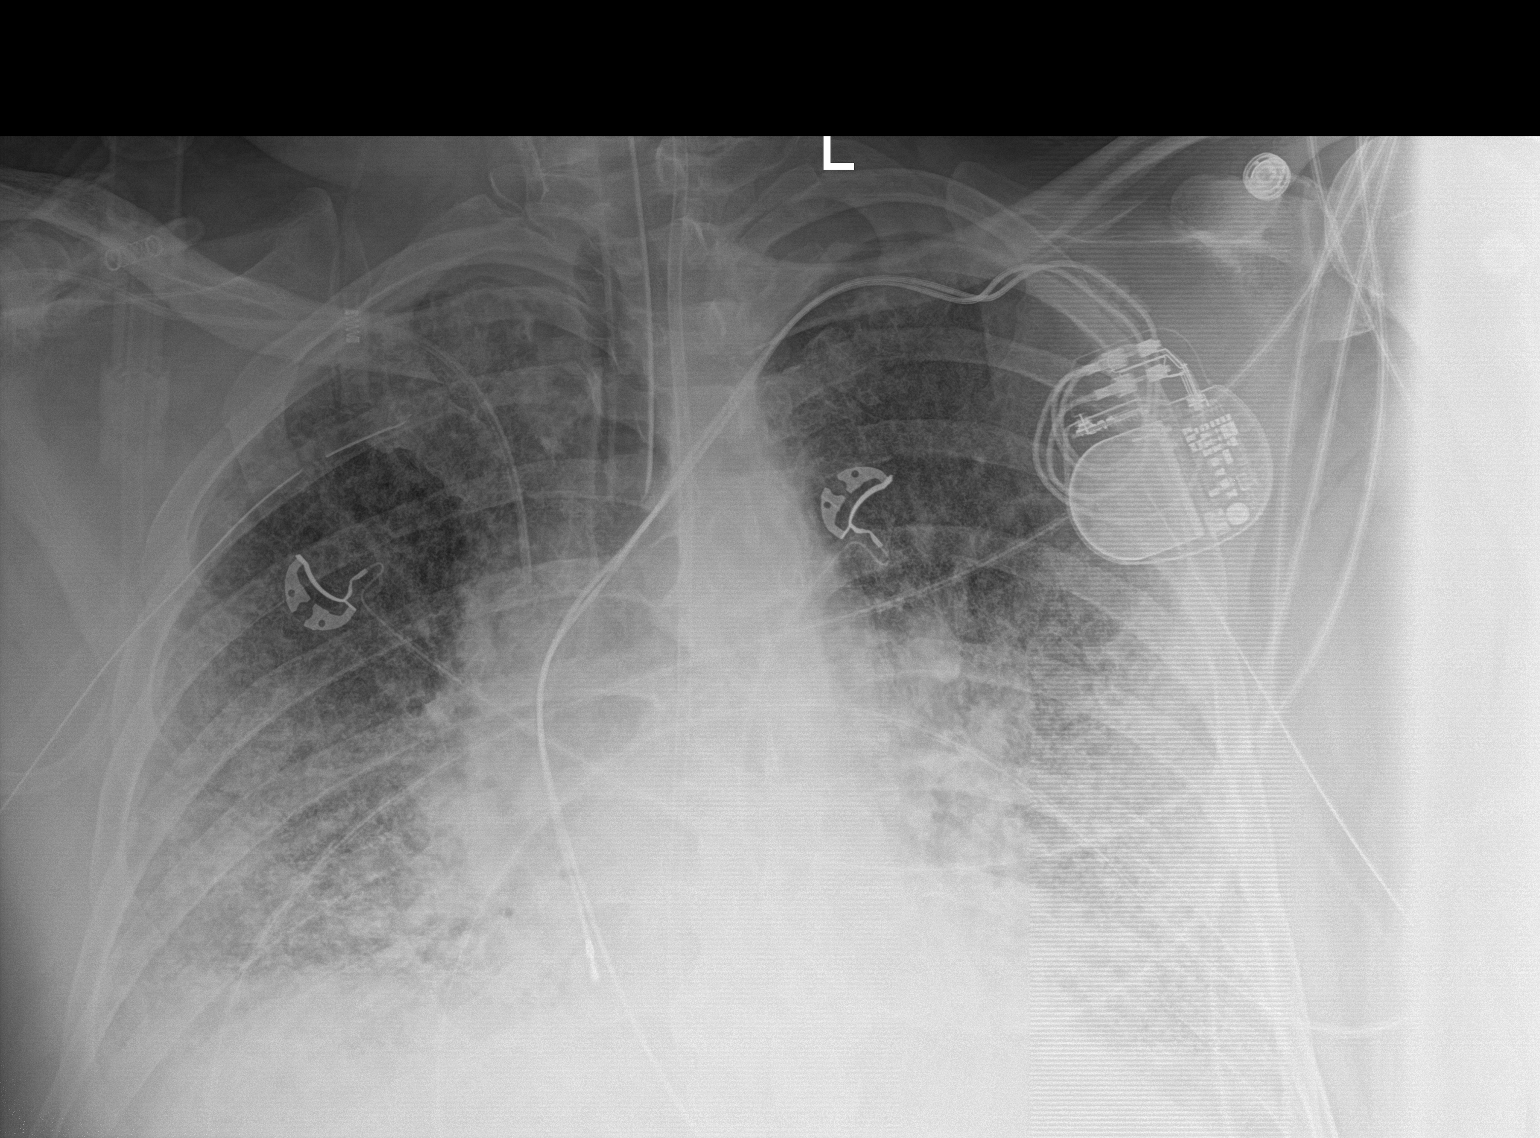

[1 of 1 positions shown; findings below may reference images not displayed]

FINDINGS: Support Apparatus:

--Endotracheal tube: Tip just below the level of the clavicular
heads.

--Enteric tube:Tip and sideport are below the field of view.

--Catheter(s):Right subclavian approach central venous catheter tip
is lower SVC

--Other: Right chest tube tip projects over the right lung apex.
Left chest 2 tip projects over the lateral fourth rib.

No residual pneumothorax. Unchanged bilateral interstitial
opacities.
IMPRESSION: 1. No residual pneumothorax.
2. Unchanged bilateral interstitial opacities.
3. Unchanged position of support devices.
